# Patient Record
Sex: Male | Born: 1965 | Race: White | Hispanic: No | Marital: Married | Smoking: Never smoker
Health system: Southern US, Community
[De-identification: ages and names within clinical notes are randomized; demographics above are authoritative.]

## PROBLEM LIST (undated history)

## (undated) DIAGNOSIS — I639 Cerebral infarction, unspecified: Secondary | ICD-10-CM

## (undated) DIAGNOSIS — E7800 Pure hypercholesterolemia, unspecified: Secondary | ICD-10-CM

## (undated) DIAGNOSIS — I63511 Cerebral infarction due to unspecified occlusion or stenosis of right middle cerebral artery: Secondary | ICD-10-CM

## (undated) DIAGNOSIS — W132XXA Fall from, out of or through roof, initial encounter: Secondary | ICD-10-CM

## (undated) DIAGNOSIS — D6861 Antiphospholipid syndrome: Secondary | ICD-10-CM

## (undated) DIAGNOSIS — I10 Essential (primary) hypertension: Secondary | ICD-10-CM

## (undated) DIAGNOSIS — Z9109 Other allergy status, other than to drugs and biological substances: Secondary | ICD-10-CM

## (undated) HISTORY — DX: Cerebral infarction due to unspecified occlusion or stenosis of right middle cerebral artery: I63.511

## (undated) HISTORY — PX: NO PAST SURGERIES: SHX2092

## (undated) HISTORY — DX: Pure hypercholesterolemia, unspecified: E78.00

## (undated) HISTORY — DX: Antiphospholipid syndrome: D68.61

## (undated) HISTORY — DX: Cerebral infarction, unspecified: I63.9

---

## 2014-10-03 ENCOUNTER — Ambulatory Visit (INDEPENDENT_AMBULATORY_CARE_PROVIDER_SITE_OTHER): Payer: Self-pay | Admitting: Physician Assistant

## 2014-10-03 VITALS — BP 126/90 | HR 82 | Temp 98.3°F | Resp 18 | Ht 70.0 in | Wt 268.4 lb

## 2014-10-03 DIAGNOSIS — Z0289 Encounter for other administrative examinations: Secondary | ICD-10-CM

## 2014-10-03 DIAGNOSIS — Z021 Encounter for pre-employment examination: Secondary | ICD-10-CM

## 2014-10-03 NOTE — Progress Notes (Deleted)
[facility] [address], [place] [address] [phone]  Date:  10/03/2014   Name:  [name]   DOB:  04-14-1965   MRN:  [id number]  PCP:  No primary care provider on file.    Chief Complaint: Annual Exam   History of Present Illness:  [name] is a 49 y.o. very pleasant male patient who presents with the following:   There are no active problems to display for this patient.   No past medical history on file.  No past surgical history on file.  Social History  Substance Use Topics  . Smoking status: Never Smoker   . Smokeless tobacco: None  . Alcohol Use: No    No family history on file.  Allergies  Allergen Reactions  . Penicillins     headache    Medication list has been reviewed and updated.  No current outpatient prescriptions on file prior to visit.   No current facility-administered medications on file prior to visit.    Review of Systems:  ***  Physical Examination: Filed Vitals:   10/03/14 1853  BP: 130/90  Pulse: 82  Temp: 98.3 F (36.8 C)  Resp: 18   Filed Vitals:   10/03/14 1853  Height:  (1.778 m)  Weight: 268 lb 6 oz (121.734 kg)   Body mass index is 38.51 kg/(m^2). Ideal Body Weight: Weight in (lb) to have BMI = 25: 173.9  ***  Assessment and Plan: ***  Signed Abbe Amsterdam, MD

## 2014-10-03 NOTE — Progress Notes (Deleted)
Commercial Driver Medical Examination   [name] is a 49 y.o. male who presents today for a commercial driver fitness determination physical exam. The patient reports {problems:16946::"no problems"}. {Common ambulatory SmartLinks:19316} Review of Systems {ros - complete:30496}   Objective:    Vision/hearing:  Visual Acuity Screening   Right eye Left eye Both eyes  Without correction:     With correction:    Applicant {can/cannot:17900::"can"} recognize and distinguish among traffic control signals and devices showing standard red, green, and amber colors.  Corrective lenses required: {Yes/No:18319::"No"}  Monocular Vision?: {Yes/No:18319::"No"}  Hearing aid requirement: {Yes/No:18319::"No"}  Physical Exam  BP 130/90 mmHg  Pulse 82  Temp(Src) 98.3 F (36.8 C) (Oral)  Resp 18  Ht  (1.778 m)  Wt 268 lb 6 oz (121.734 kg)  BMI 38.51 kg/m2  SpO2 98%  Labs:    Assessment:    Healthy male exam.  {Determination:19453}    Plan:    {Plan:19460::"Medical examiners certificate completed and printed.","Return as needed."}

## 2014-10-04 NOTE — Progress Notes (Signed)
Commercial Driver Medical Examination   [name] is a 49 y.o. male who presents today for a commercial driver fitness determination physical exam. The patient reports no problems. The following portions of the patient's history were reviewed and updated as appropriate: allergies, current medications, past family history, past medical history, past social history, past surgical history and problem list.  No hx OSA, HTN, DM, kidney or liver disease, lung dz or problems breathing. He has strained his back before but no current problems. He has ophthalmology appt in next couple weeks. He does not have a current PCP, prior PCP retired.  States he does not drive very often. 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  Review of Systems Pertinent items are noted in HPI.   Objective:    Vision/hearing:  Visual Acuity Screening   Right eye Left eye Both eyes  Without correction:     With correction: 20/40 20/40 20/30   whisper at 10 FT bilaterally  Applicant can recognize and distinguish among traffic control signals and devices showing standard red, green, and amber colors.  Corrective lenses required: Yes  Monocular Vision?: No  Hearing aid requirement: No  Physical Exam  Constitutional: He is oriented to person, place, and time. He appears well-developed and well-nourished.  HENT:  Head: Normocephalic and atraumatic.  Right Ear: Hearing, tympanic membrane, external ear and ear canal normal.  Left Ear: Hearing, tympanic membrane, external ear and ear canal normal.  Nose: Nose normal.  Mouth/Throat: Uvula is midline, oropharynx is clear and moist and mucous membranes are normal.  Eyes: Conjunctivae, EOM and lids are normal. Pupils are equal, round, and reactive to light. Right eye exhibits no discharge. Left eye exhibits no discharge. No scleral icterus.  Neck: Trachea normal and normal range of motion. Neck supple. Carotid bruit is not present. No thyromegaly present.   Cardiovascular: Normal rate, regular rhythm, normal heart sounds, intact distal pulses and normal pulses.   No murmur heard. Pulmonary/Chest: Effort normal and breath sounds normal. No respiratory distress. He has no wheezes. He has no rhonchi. He has no rales.  Abdominal: Soft. Normal appearance and bowel sounds are normal. He exhibits no abdominal bruit. There is no tenderness.  Musculoskeletal: Normal range of motion. He exhibits no edema or tenderness.  Lymphadenopathy:       Head (right side): No submental, no submandibular, no tonsillar, no preauricular, no posterior auricular and no occipital adenopathy present.       Head (left side): No submental, no submandibular, no tonsillar, no preauricular, no posterior auricular and no occipital adenopathy present.    He has no cervical adenopathy.  Neurological: He is alert and oriented to person, place, and time. He has normal strength and normal reflexes. No cranial nerve deficit or sensory deficit. Coordination and gait normal.  Skin: Skin is warm, dry and intact. No lesion and no rash noted.  Psychiatric: He has a normal mood and affect. His speech is normal and behavior is normal. Judgment and thought content normal.   BP 126/90 mmHg  Pulse 82  Temp(Src) 98.3 F (36.8 C) (Oral)  Resp 18  Ht 5\' 10"  (1.778 m)  Wt 268 lb 6 oz (121.734 kg)  BMI 38.51 kg/m2  SpO2 98%  Labs:  SP GR 1.015, PRO 30 GLU neg BLOOD neg  Assessment:    Healthy male exam.  Meets standards in 32 CFR 391.41;  qualifies for 2 year certificate.    Plan:    Medical examiners certificate  completed and printed. Return as needed.  Establish care with new PCP.

## 2016-02-11 DIAGNOSIS — I10 Essential (primary) hypertension: Secondary | ICD-10-CM

## 2016-02-11 HISTORY — DX: Essential (primary) hypertension: I10

## 2016-10-18 ENCOUNTER — Encounter: Payer: Self-pay | Admitting: Physician Assistant

## 2016-10-18 ENCOUNTER — Ambulatory Visit (INDEPENDENT_AMBULATORY_CARE_PROVIDER_SITE_OTHER): Payer: Self-pay | Admitting: Physician Assistant

## 2016-10-18 VITALS — BP 171/124 | HR 75 | Temp 98.1°F | Resp 16 | Ht 70.0 in | Wt 285.8 lb

## 2016-10-18 DIAGNOSIS — Z024 Encounter for examination for driving license: Secondary | ICD-10-CM

## 2016-10-18 DIAGNOSIS — R030 Elevated blood-pressure reading, without diagnosis of hypertension: Secondary | ICD-10-CM

## 2016-10-18 DIAGNOSIS — I10 Essential (primary) hypertension: Secondary | ICD-10-CM | POA: Insufficient documentation

## 2016-10-18 NOTE — Progress Notes (Signed)
This patient presents for DOT examination for fitness for duty.   Medical History:  1. Head/Brain Injuries, disorders or illnesses no  2. Seizures, epilepsy no  3. Eye disorders or impaired vision (except corrective lenses) no  4. Ear disorders, loss of hearing or balance no  5. Heart disease or heart attack, other cardiovascular condition no  6. Heart surgery (valve replacement/bypass, angioplasty, pacemaker/defribrillator) no  7. High blood pressure no  8. High cholesterol no  9. Chronic cough, shortness of breath or other breathing problems no  10. Lung disease (emphysema, asthma or chronic bronchitis) no  11. Kidney disease, dialysis no  12. Digestive problems  no  13. Diabetes or elevated blood sugar no                      If yes to #13, Insulin use no  14. Nervious or psychiatric disorders, e.g., severe depression no  15. Fainting or syncope no  16. Dizziness, headaches, numbness, tingling or memory loss no  17. Unexplained weight loss no  18. Stroke, TIA or paralysis no  19. Missing or impaired hand, arm, foot, leg, finger, toe no  20. Spinal injury or disease no  21. Bone, muscles or nerve problems no  22. Blood clots or bleeding bleeding disorders no  23. Cancer no  24. Chronic infection or other chronic diseases no  25. Sleep disorders, pauses in breathing while asleep, daytime sleepiness, loud snoring no  26. Have you ever had a sleep test? no  27.  Have you ever spent a night in the hospital? yes, renal contusion 2001; resolved  28. Have you ever had a broken bone? no  29. Have you or or do you use tobacco products? no  30. Regular, frequent alcohol use no  31. Illegal substance use within the past 2 years no  32.  Have you ever failed a drug test or been dependent on an illegal substance? no   Current Medications: Prior to Admission medications   Medication Sig Start Date End Date Taking? Authorizing Provider  Pseudoephedrine-Ibuprofen (ADVIL COLD/SINUS  PO) Take by mouth daily as needed.   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  Recent URI-type symptoms. Taking OTC cold and sinus product, contains pseudofed. Last dose yesterday.  TESTING:   Visual Acuity Screening   Right eye Left eye Both eyes  Without correction:     With correction: 20/20 20/20 20/20   Comments: Pt could see colors at 10 Ft  Hearing Screening Comments: Pt could hear colors at 10 ft  Monocular Vision: No.  Hearing Aid used for test: No. Hearing Aid required to to meet standard: No.  BP (!) 164/122   Pulse 75   Temp 98.1 F (36.7 C) (Oral)   Resp 16   Ht 5\' 10"  (1.778 m)   Wt 285 lb 12.8 oz (129.6 kg)   SpO2 96%   BMI 41.01 kg/m  Pulse rate is regular.  Comments: SP GR: 1.010, PRO: NEG, BLO: NEG, Sugar: NEG  PHYSICAL EXAMINATION:  General Appearance Not markedly obese. No tremor, signs of alcoholism, problem drinking or drug abuse.   Skin Warm, dry and intact. Scars (No), Tattoos (No)  Eyes Pupils are equal, round and reactive to light and  accommodation, extraocular movements are intact. No exophthalmos, no nystagmus. Evidence of cataracts, retinopathy, macular degeneration, aphakia, glaucoma may need referral to a specialist.  Ears Normal external ears. External canal without occlusion. No scarring of the TM. No perforation of the TM.  Mouth and Throat Clear and moist. No irremedial deformities likely to interfere with breathing or swallowing.  Heart No murmurs, extra sounds, evidence of cardiomegaly. No pacemaker. No implantable defibrillator.  Lungs and Chest (excluding breasts) Normal chest expansion, respiratory rate, breath sounds. No cyanosis.  Abdomen and Viscera No liver enlargement. No splenic enlargement. No masses, bruits,  hernias or significant abdominal wall weakness.  Genitourinary  No inguinal or femoral hernia.  Spine and other musculoskeletal No tenderness, no limitation of motion, no deformities. No evidence of previous surgery.  Extremities No loss or impairment of leg, foot, toe, arm, hand, finger. No perceptible limp, deformities, atrophy, weakness, paralysis, clubbing, edema, hypotonia. Patient has sufficient grasp and prehension to maintain steering wheel grip. Patient has sufficient mobility and strength in the lower limbs to operate pedals properly.  Neurologic Normal equilibrium, coordination, speech pattern. No paresthesia, asymmetry of deep tendon reflexes, sensory or positional abnormalities. No abnormality of patellar or Babinski's reflexes.  Gait Not antalgic or ataxic  Vascular Normal pulses. No carotid or arterial bruits. No varicose veins.    Does not meet standards. Temporarily disqualified due to : elevated blood pressure.  Return to medical examiner's office for follow-up in 1-3 weeks (must be 45d or less)  Wearing corrective lenses: yes Wearing hearing aid: no Accompanied by a N/A waiver/exemption Skill performance Evaluation (SPE) Certificate: no Driving within an exempt intracity zone: no Qualified by operation of 49 CFR 391.64: no  Certification expires N/A

## 2016-10-18 NOTE — Patient Instructions (Addendum)
The level of blood pressure elevation today disqualifies you from medical certification. I suspect that the elevation is, at least in part, due to the recent use of pseudofed-containing medication. Please STOP use of such products. Consider a non-sedating oral antihistamine instead for any allergy or cold symptoms.  Please make arrangements to see ME again in 1-2 weeks to reassess your blood pressure. If it is below 140/90 at that time, I can addend the examination certificate and qualify you. If it is not, we will start you on a blood pressure medication.    IF you received an x-ray today, you will receive an invoice from [facility]. Please contact [facility] at [phone] with questions or concerns regarding your invoice.   IF you received labwork today, you will receive an invoice from [facility]. Please contact [facility] at [phone] with questions or concerns regarding your invoice.   Our billing staff will not be able to assist you with questions regarding bills from these companies.  You will be contacted with the lab results as soon as they are available. The fastest way to get your results is to activate your My Chart account. Instructions are located on the last page of this paperwork. If you have not heard from us regarding the results in 2 weeks, please contact this office.

## 2016-10-18 NOTE — Assessment & Plan Note (Signed)
STOP decongestant. Recheck BP in 1-3 weeks. If >140/90, will initiate evaluation and treatment.

## 2016-11-07 ENCOUNTER — Encounter: Payer: Self-pay | Admitting: Physician Assistant

## 2016-11-07 ENCOUNTER — Ambulatory Visit (INDEPENDENT_AMBULATORY_CARE_PROVIDER_SITE_OTHER): Payer: Self-pay | Admitting: Physician Assistant

## 2016-11-07 VITALS — BP 165/122 | HR 92 | Temp 98.9°F | Resp 18 | Ht 70.5 in | Wt 282.2 lb

## 2016-11-07 DIAGNOSIS — Z23 Encounter for immunization: Secondary | ICD-10-CM

## 2016-11-07 DIAGNOSIS — I10 Essential (primary) hypertension: Secondary | ICD-10-CM

## 2016-11-07 DIAGNOSIS — Z6839 Body mass index (BMI) 39.0-39.9, adult: Secondary | ICD-10-CM

## 2016-11-07 DIAGNOSIS — J309 Allergic rhinitis, unspecified: Secondary | ICD-10-CM

## 2016-11-07 MED ORDER — AMLODIPINE BESYLATE 10 MG PO TABS
10.0000 mg | ORAL_TABLET | Freq: Every day | ORAL | 3 refills | Status: DC
Start: 2016-11-07 — End: 2017-02-26

## 2016-11-07 MED ORDER — FLUTICASONE PROPIONATE 50 MCG/ACT NA SUSP: 2.0000 | Freq: Every day | NASAL | 3 refills | Status: DC

## 2016-11-07 NOTE — Patient Instructions (Addendum)
Please fast for your wellness visit. Hopefully, we will have the records from your previous provider by then and can identify what care gaps there are that we need to address.    IF you received an x-ray today, you will receive an invoice from [facility]. Please contact [facility] at [phone] with questions or concerns regarding your invoice.   IF you received labwork today, you will receive an invoice from [facility]. Please contact [facility] at [phone] with questions or concerns regarding your invoice.   Our billing staff will not be able to assist you with questions regarding bills from these companies.  You will be contacted with the lab results as soon as they are available. The fastest way to get your results is to activate your My Chart account. Instructions are located on the last page of this paperwork. If you have not heard from [place] regarding the results in 2 weeks, please contact this office.

## 2016-11-07 NOTE — Assessment & Plan Note (Signed)
Stop oral decongestants entirely, due to HTN. Use steroid NS and OTC non-sedating oral antihistamine. Would add azelastine and/or montelukast if needs additional treatment.

## 2016-11-07 NOTE — Progress Notes (Signed)
Patient ID: [name], male    DOB: January 13, 1966, 51 y.o.   MRN: [id number]  PCP: Patient, No Pcp Per  Chief Complaint  Patient presents with  . Hypertension  . Follow-up  . Employment Physical    DOT     Subjective:   Presents for evaluation of elevated blood pressure.  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 He had been using OTC decongestants regularly, and it was thought that was likely contributing to his elevated BP. I advised he stop the decongestants and come back for re-evaluation, potentially to qualify for a new card.  Has continued to take pseudoephedrine-containing products intermittently since he was here 10/18/2016. His allergy symptoms have waxed and waned with our recent rainy weather.  No CP, SOB, dizziness. HA associated with congestion and drainage, resolves with oral decongestant.  Review of Systems As above.    Patient Active Problem List   Diagnosis Date Noted  . Elevated blood-pressure reading without diagnosis of hypertension 10/18/2016     Prior to Admission medications   Medication Sig Start Date End Date Taking? Authorizing Provider  Pseudoephedrine-Ibuprofen (ADVIL COLD/SINUS PO) Take by mouth daily as needed.   Yes [provider]     Allergies  Allergen Reactions  . Penicillins     headache       Objective:  Physical Exam  Constitutional: He is oriented to person, place, and time. He appears well-developed and well-nourished. He is active and cooperative. No distress.  BP (!) 162/90   Pulse 92   Temp 98.9 F (37.2 C) (Oral)   Resp 18   Ht 5' 10.5" (1.791 m)   Wt 282 lb 3.2 oz (128 kg)   SpO2 97%   BMI 39.92 kg/m   HENT:  Head: Normocephalic and atraumatic.  Right Ear: Hearing normal.  Left Ear: Hearing normal.  Eyes: Conjunctivae are normal. No scleral icterus.  Neck: Normal range of motion. Neck supple. No thyromegaly present.  Cardiovascular: Normal rate,  regular rhythm and normal heart sounds.   Pulses:      Radial pulses are 2+ on the right side, and 2+ on the left side.  Pulmonary/Chest: Effort normal and breath sounds normal.  Lymphadenopathy:       Head (right side): No tonsillar, no preauricular, no posterior auricular and no occipital adenopathy present.       Head (left side): No tonsillar, no preauricular, no posterior auricular and no occipital adenopathy present.    He has no cervical adenopathy.       Right: No supraclavicular adenopathy present.       Left: No supraclavicular adenopathy present.  Neurological: He is alert and oriented to person, place, and time. No sensory deficit.  Skin: Skin is warm, dry and intact. No rash noted. No cyanosis or erythema. Nails show no clubbing.  Psychiatric: He has a normal mood and affect. His speech is normal and behavior is normal.       Assessment & Plan:   Problem List Items Addressed This Visit    Benign essential HTN - Primary    Again advised against use of oral decongestant. Treat allergies with other products. Initiate treatment of HTN with amlodipine. Will check labs and EKG at his upcoming wellness visit.      Relevant Medications   amLODipine (NORVASC) 10 MG tablet   Allergic rhinitis    Stop oral decongestants entirely, due to HTN. Use steroid  NS and OTC non-sedating oral antihistamine. Would add azelastine and/or montelukast if needs additional treatment.      Relevant Medications   fluticasone (FLONASE) 50 MCG/ACT nasal spray   BMI 39.0-39.9,adult    Encouraged healthy eating and regular exercise.       Other Visit Diagnoses    Need for influenza vaccination       Relevant Orders   Flu Vaccine QUAD 36+ mos IM (Completed)   Care order/instruction:       Return for Wellness Exam and fasting labs in the next 1-8 weeks.   Fernande Bras, PA-C [facility]

## 2016-11-07 NOTE — Assessment & Plan Note (Signed)
Encouraged healthy eating and regular exercise. 

## 2016-11-07 NOTE — Assessment & Plan Note (Signed)
Again advised against use of oral decongestant. Treat allergies with other products. Initiate treatment of HTN with amlodipine. Will check labs and EKG at his upcoming wellness visit.

## 2016-11-15 ENCOUNTER — Encounter: Payer: Self-pay | Admitting: Physician Assistant

## 2016-11-19 ENCOUNTER — Encounter: Payer: Self-pay | Admitting: Physician Assistant

## 2016-11-26 ENCOUNTER — Encounter: Payer: Self-pay | Admitting: Physician Assistant

## 2016-11-26 ENCOUNTER — Ambulatory Visit (INDEPENDENT_AMBULATORY_CARE_PROVIDER_SITE_OTHER): Payer: BLUE CROSS/BLUE SHIELD | Admitting: Physician Assistant

## 2016-11-26 VITALS — BP 136/96 | HR 98 | Temp 98.6°F | Resp 18 | Ht 70.5 in | Wt 285.8 lb

## 2016-11-26 DIAGNOSIS — Z13220 Encounter for screening for lipoid disorders: Secondary | ICD-10-CM

## 2016-11-26 DIAGNOSIS — Z125 Encounter for screening for malignant neoplasm of prostate: Secondary | ICD-10-CM | POA: Diagnosis not present

## 2016-11-26 DIAGNOSIS — I10 Essential (primary) hypertension: Secondary | ICD-10-CM | POA: Diagnosis not present

## 2016-11-26 DIAGNOSIS — Z114 Encounter for screening for human immunodeficiency virus [HIV]: Secondary | ICD-10-CM | POA: Diagnosis not present

## 2016-11-26 DIAGNOSIS — Z0000 Encounter for general adult medical examination without abnormal findings: Secondary | ICD-10-CM

## 2016-11-26 DIAGNOSIS — Z1211 Encounter for screening for malignant neoplasm of colon: Secondary | ICD-10-CM | POA: Diagnosis not present

## 2016-11-26 DIAGNOSIS — Z23 Encounter for immunization: Secondary | ICD-10-CM | POA: Diagnosis not present

## 2016-11-26 NOTE — Assessment & Plan Note (Signed)
Improved with increased dose of amlodipine, but still borderline. Advised he check at home 1-2 times/week. 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

## 2016-11-26 NOTE — Patient Instructions (Addendum)
Keep up the great work. Weight loss will help your blood pressure. Check your blood pressure at home 1-2 times each week and record the results. Our goal is less than 120/80. If it's consistently above 140/90, we'll need to adjust the medication.    IF you received an x-ray today, you will receive an invoice from [facility]. Please contact [facility] at [phone] with questions or concerns regarding your invoice.   IF you received labwork today, you will receive an invoice from [facility]. Please contact [facility] at [phone] with questions or concerns regarding your invoice.   Our billing staff will not be able to assist you with questions regarding bills from these companies.  You will be contacted with the lab results as soon as they are available. The fastest way to get your results is to activate your My Chart account. Instructions are located on the last page of this paperwork. If you have not heard from [place] regarding the results in 2 weeks, please contact this office.     Preventive Care 40-64 Years, Male Preventive care refers to lifestyle choices and visits with your health care provider that can promote health and wellness. What does preventive care include?  A yearly physical exam. This is also called an annual well check.  Dental exams once or twice a year.  Routine eye exams. Ask your health care provider how often you should have your eyes checked.  Personal lifestyle choices, including: ? Daily care of your teeth and gums. ? Regular physical activity. ? Eating a healthy diet. ? Avoiding tobacco and drug use. ? Limiting alcohol use. ? Practicing safe sex. ? Taking low-dose aspirin every day starting at age 63. What happens during an annual well check? The services and screenings done by your health care provider during your annual well check will depend on your age, overall health, lifestyle risk factors, and family history of  disease. Counseling Your health care provider may ask you questions about your:  Alcohol use.  Tobacco use.  Drug use.  Emotional well-being.  Home and relationship well-being.  Sexual activity.  Eating habits.  Work and work Astronomer.  Screening You may have the following tests or measurements:  Height, weight, and BMI.  Blood pressure.  Lipid and cholesterol levels. These may be checked every 5 years, or more frequently if you are over 50 years old.  Skin check.  Lung cancer screening. You may have this screening every year starting at age 81 if you have a 30-pack-year history of smoking and currently smoke or have quit within the past 15 years.  Fecal occult blood test (FOBT) of the stool. You may have this test every year starting at age 9.  Flexible sigmoidoscopy or colonoscopy. You may have a sigmoidoscopy every 5 years or a colonoscopy every 10 years starting at age 93.  Prostate cancer screening. Recommendations will vary depending on your family history and other risks.  Hepatitis C blood test.  Hepatitis B blood test.  Sexually transmitted disease (STD) testing.  Diabetes screening. This is done by checking your blood sugar (glucose) after you have not eaten for a while (fasting). You may have this done every 1-3 years.  Discuss your test results, treatment options, and if necessary, the need for more tests with your health care provider. Vaccines Your health care provider may recommend certain vaccines, such as:  Influenza vaccine. This is recommended every year.  Tetanus, diphtheria, and acellular pertussis (Tdap, Td) vaccine. You may need a  Td booster every 10 years.  Varicella vaccine. You may need this if you have not been vaccinated.  Zoster vaccine. You may need this after age 48.  Measles, mumps, and rubella (MMR) vaccine. You may need at least one dose of MMR if you were born in 1957 or later. You may also need a second  dose.  Pneumococcal 13-valent conjugate (PCV13) vaccine. You may need this if you have certain conditions and have not been vaccinated.  Pneumococcal polysaccharide (PPSV23) vaccine. You may need one or two doses if you smoke cigarettes or if you have certain conditions.  Meningococcal vaccine. You may need this if you have certain conditions.  Hepatitis A vaccine. You may need this if you have certain conditions or if you travel or work in places where you may be exposed to hepatitis A.  Hepatitis B vaccine. You may need this if you have certain conditions or if you travel or work in places where you may be exposed to hepatitis B.  Haemophilus influenzae type b (Hib) vaccine. You may need this if you have certain risk factors.  Talk to your health care provider about which screenings and vaccines you need and how often you need them. This information is not intended to replace advice given to you by your health care provider. Make sure you discuss any questions you have with your health care provider. Document Released: 02/23/2015 Document Revised: 10/17/2015 Document Reviewed: 11/28/2014 Elsevier Interactive Patient Education  2017 [employer].

## 2016-11-26 NOTE — Progress Notes (Signed)
Subjective:    Patient ID: [name], male    DOB: March 12, 1965, 51 y.o.   MRN: [id number]  HPI  [name] is a 51 year old Caucasian male with a history of hypertension and allergic rhinitis, and a family history of hypertension who presents today for his annual physical exam. He reports overall everything is going well. He is compliant with his Amlodipine. [name] reports he is not checking his blood pressures at home, but he does have a cuff at home and would consider starting. [name] denies headaches, chest pain and dyspnea. He also denies swelling in his hands and feet, orthopnea, and PND.   [name] reports he usually eats 3 meals per day. On the weekends he will eat a bigger lunch and snacks the rest of the day. He states he is starting to eat more well-balanced meals. [name] is walking more and is about to start working biking on his stationary bike. [name] reports he is drinking alcohol 2 times per year. He denies tobacco and illicit drug use. [name] is currently   Working from home and traveling more frequently. Not minding traveling as much.   [name] reports his allergic rhinitis is currently well controlled. The pollen bothers him all year round, but the spring and start of fall are the worst. He reports he takes Careers adviserAllegra daily and using Flonase as needed.   Of note, [name] wears glasses and his last optometry appointment was in July 2018.  [name]'s mother, father, and sister all have hypertension. He had two aunts who died of heart attacks and his uncle had leukemia.   Medications:  - Amlodipine 10mg  daily - Flonase 50mg c/act 2 sprays as needed  Allergies:  - Penicillins (reaction: headaches and queasy) - Blueberries and strawberries (Reaction: headaches)    Chronic Medical Conditions:  - Hypertension - Allergic rhinitis   Surgeries:  - None  Immunizations:  - Up to date  Review of Systems    Objective:   Physical Exam Anthropometrics:  Height:  5' 10.5" Weight: 285 lbs 12.8oz BMI: 40.43 kg/m2 WC: 53.5 in   Vitals:  Temp: 98.6 F, orally BP: 140/100 HR: 98 bpm RR: 18 breaths per minute O2 Sat: 97%  General: 51 year old overweight Caucasian male sitting comfortably on exam table in no acute distress  Head: Normocephalic, atraumatic  Eyes: Conjunctiva pink, sclera clear. Pupils equal and reactive to light and accomodation. Extraoccular movements intact.  Ears: External auditory canal without erythema or cerumen bilaterally. Bilateral TMs pearly grey without erythema or effusions bilaterally. All bony landmarks visible bilaterally.  Nose: Nares patent bilaterally. No rhinorrhea. Nasal turbinates not boggy. Mouth: Lips and oral mucosa without lesions. Good dentition. Posterior oropharynx without erythema.   Neck: Supple. No cervical lymphadenopathy. No thyromegaly or thyroid tenderness. Cardiovascular: Regular rate and rhythm. S1 and S2 distinct. No murmurs auscultated. 2+ radial, posterior tibialis, and dorsalis pedis pulses.  Lungs: Clear to auscultation bilaterally.  Abdomen: Protuberant. Bowel sounds active in all 4 quadrants. Non-tender to palpation in all 4 quadrants:  MSK: 5/5 strength in all extremities. Neuro: Cranial nerves intact. Good sensation on all extremities.     Assessment & Plan:  1. Annual physical exam - CMP obtained today - CBC obtained today - Lipid Panel obtained today - TSH obtained today in clinic - HIV screen obtained today in clinic - Referred to gastroenterology for Colonoscopy   2. Hypertension  - Continue Amlodipine 10mg  daily -  Recommended patient measures at home blood pressures and log values for [place] to continue to monitor blood pressure.  - Encouraged patient to continue to eat more fruits and vegetables, lean meats, and decrease sodium intake. Also encouraged patient to continue to get at least 30 minutes of exercise 5 times per week.  - Follow-up in 3 months for blood pressure  re-check  3. Allergic Rhinitis  - Continue Allegra daily - Continue Flonase 51mcg/act 2 sprays in both nostrils per day as needed for nasal congestion   Neilani Duffee, PA-S

## 2016-11-26 NOTE — Progress Notes (Signed)
Patient ID: [name], male    DOB: 11/29/1965, 51 y.o.   MRN: [id number]  PCP: Porfirio Oar, PA-C  Chief Complaint  Patient presents with  . Annual Exam  . Paperwork    Physician results form    Subjective:   Presents for [employer] Visit.  He is tolerating amlodipine to treat HTN, recently increased from 5 mg to 10 mg to achieve improved control. 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 He does not check his BP at home, but his wife has a monitor and he could use it.  He is working on [employer] and exercise. He recently ordered a stationary bike.  Colorectal Cancer Screening: not yet. Will refer today. Prostate Cancer Screening: no previous PSA noted. Bone Density Testing: not yet a candidate HIV Screening: today STI Screening: very low risk Seasonal Influenza Vaccination: current Td/Tdap Vaccination: today Pneumococcal Vaccination: not yet a candidate Zoster Vaccination: not yet Frequency of Eye evaluation: 08/2016, annually     Patient Active Problem List   Diagnosis Date Noted  . Allergic rhinitis 11/07/2016  . BMI 39.0-39.9,adult 11/07/2016  . Benign essential HTN 10/18/2016    History reviewed. No pertinent past medical history.   Prior to Admission medications   Medication Sig Start Date End Date Taking? Authorizing Provider  amLODipine (NORVASC) 10 MG tablet Take 1 tablet (10 mg total) by mouth daily. 11/07/16  Yes Kathelene Rumberger, PA-C  fluticasone (FLONASE) 50 MCG/ACT nasal spray Place 2 sprays into both nostrils daily. 11/07/16  Yes Porfirio Oar, PA-C    Allergies  Allergen Reactions  . Penicillins     headache    Past Surgical History:  Procedure Laterality Date  . NO PAST SURGERIES      Family History  Problem Relation Age of Onset  . Hypertension Mother   . Hypertension Father   . Stroke Father   . Hypertension Sister     Social History   Social History  . Marital status: Married      Spouse name: Vickie  . Number of children: 0  . Years of education: College   Occupational History  . outside sales     [employer]   Social History Main Topics  . Smoking status: Never Smoker  . Smokeless tobacco: Never Used  . Alcohol use 0.0 oz/week     Comment: rarely, 1-2 times/year  . Drug use: No  . Sexual activity: Yes    Partners: Female   Other Topics Concern  . None   Social History Narrative   Lives with his wife.       Review of Systems  Constitutional: Negative.   HENT: Negative.   Eyes: Negative.   Respiratory: Negative.   Cardiovascular: Negative.   Gastrointestinal: Negative.   Endocrine: Negative.   Genitourinary: Negative.   Musculoskeletal: Negative.   Skin: Negative.   Allergic/Immunologic: Positive for environmental allergies (well controlled).  Neurological: Negative.   Hematological: Negative.   Psychiatric/Behavioral: Negative.         Objective:  Physical Exam  Constitutional: He is oriented to person, place, and time. He appears well-developed and well-nourished. He is active and cooperative.  Non-toxic appearance. He does not have a sickly appearance. He does not appear ill. No distress.  BP (!) 136/96 (BP Location: Left Arm, Patient Position: Sitting, Cuff Size: Large)   Pulse 98   Temp 98.6 F (37 C) (Oral)   Resp 18  Ht 5' 10.5" (1.791 m)   Wt 285 lb 12.8 oz (129.6 kg)   SpO2 97%   BMI 40.43 kg/m    HENT:  Head: Normocephalic and atraumatic.  Right Ear: Hearing, tympanic membrane, external ear and ear canal normal.  Left Ear: Hearing, tympanic membrane, external ear and ear canal normal.  Nose: Nose normal.  Mouth/Throat: Uvula is midline, oropharynx is clear and moist and mucous membranes are normal. He does not have dentures. No oral lesions. No trismus in the jaw. Normal dentition. No dental abscesses, uvula swelling, lacerations or dental caries.  Eyes: Pupils are equal, round, and reactive to light.  Conjunctivae, EOM and lids are normal. Right eye exhibits no discharge. Left eye exhibits no discharge. No scleral icterus.  Fundoscopic exam:      The right eye shows no arteriolar narrowing, no AV nicking, no exudate, no hemorrhage and no papilledema.       The left eye shows no arteriolar narrowing, no AV nicking, no exudate, no hemorrhage and no papilledema.  Neck: Normal range of motion, full passive range of motion without pain and phonation normal. Neck supple. No spinous process tenderness and no muscular tenderness present. No neck rigidity. No tracheal deviation, no edema, no erythema and normal range of motion present. No thyromegaly present.  Cardiovascular: Normal rate, regular rhythm, S1 normal, S2 normal, normal heart sounds, intact distal pulses and normal pulses.  Exam reveals no gallop and no friction rub.   No murmur heard. Pulmonary/Chest: Effort normal and breath sounds normal. No respiratory distress. He has no wheezes. He has no rales.  Abdominal: Soft. Normal appearance and bowel sounds are normal. He exhibits no distension and no mass. There is no hepatosplenomegaly. There is no tenderness. There is no rebound and no guarding. No hernia.  Musculoskeletal: Normal range of motion. He exhibits no edema or tenderness.       Cervical back: Normal. He exhibits normal range of motion, no tenderness, no bony tenderness, no swelling, no edema, no deformity, no laceration, no pain, no spasm and normal pulse.       Thoracic back: Normal.       Lumbar back: Normal.  Lymphadenopathy:       Head (right side): No submental, no submandibular, no tonsillar, no preauricular, no posterior auricular and no occipital adenopathy present.       Head (left side): No submental, no submandibular, no tonsillar, no preauricular, no posterior auricular and no occipital adenopathy present.    He has no cervical adenopathy.       Right: No supraclavicular adenopathy present.       Left: No  supraclavicular adenopathy present.  Neurological: He is alert and oriented to person, place, and time. He has normal strength and normal reflexes. He displays no tremor. No cranial nerve deficit. He exhibits normal muscle tone. Coordination and gait normal.  Skin: Skin is warm, dry and intact. No abrasion, no ecchymosis, no laceration, no lesion and no rash noted. He is not diaphoretic. No cyanosis or erythema. No pallor. Nails show no clubbing.  Psychiatric: He has a normal mood and affect. His speech is normal and behavior is normal. Judgment and thought content normal. Cognition and memory are normal.    Wt Readings from Last 3 Encounters:  11/26/16 285 lb 12.8 oz (129.6 kg)  11/07/16 282 lb 3.2 oz (128 kg)  10/18/16 285 lb 12.8 oz (129.6 kg)       Assessment & Plan:   Problem List Items  Addressed This Visit    Benign essential HTN    Improved with increased dose of amlodipine, but still borderline. Advised he check at home 1-2 times/week. 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      Relevant Orders   CBC with Differential/Platelet   Comprehensive metabolic panel   TSH   Urinalysis, dipstick only    Other Visit Diagnoses    Annual physical exam    -  Primary   Age appropriate health guidance provided.   Screening for HIV (human immunodeficiency virus)       Relevant Orders   HIV antibody   Screening for hyperlipidemia       Relevant Orders   Lipid panel   Screening for prostate cancer       Relevant Orders   PSA   Screening for colon cancer       Relevant Orders   Ambulatory referral to Gastroenterology   Need for Tdap vaccination       Relevant Orders   Tdap vaccine greater than or equal to 7yo IM (Completed)       Return in about 3 months (around 02/26/2017) for re-evalaution of blood pressure.   Fernande Brashelle S. Annel Zunker, PA-C [facility] at [facility]

## 2016-11-27 LAB — URINALYSIS, DIPSTICK ONLY
Bilirubin, UA: NEGATIVE
Glucose, UA: NEGATIVE
Ketones, UA: NEGATIVE
Leukocytes, UA: NEGATIVE
Nitrite, UA: NEGATIVE
Protein, UA: NEGATIVE
RBC, UA: NEGATIVE
Specific Gravity, UA: 1.018 (ref 1.005–1.030)
Urobilinogen, Ur: 0.2 mg/dL (ref 0.2–1.0)
pH, UA: 6 (ref 5.0–7.5)

## 2016-11-27 LAB — CBC WITH DIFFERENTIAL/PLATELET
Basophils Absolute: 0 10*3/uL (ref 0.0–0.2)
Basos: 0 %
EOS (ABSOLUTE): 0.2 10*3/uL (ref 0.0–0.4)
Eos: 2 %
Hematocrit: 51.6 % — ABNORMAL HIGH (ref 37.5–51.0)
Hemoglobin: 17.3 g/dL (ref 13.0–17.7)
Immature Grans (Abs): 0.1 10*3/uL (ref 0.0–0.1)
Immature Granulocytes: 1 %
Lymphocytes Absolute: 2.7 10*3/uL (ref 0.7–3.1)
Lymphs: 36 %
MCH: 30.8 pg (ref 26.6–33.0)
MCHC: 33.5 g/dL (ref 31.5–35.7)
MCV: 92 fL (ref 79–97)
Monocytes Absolute: 0.6 10*3/uL (ref 0.1–0.9)
Monocytes: 8 %
Neutrophils Absolute: 3.9 10*3/uL (ref 1.4–7.0)
Neutrophils: 53 %
Platelets: 276 10*3/uL (ref 150–379)
RBC: 5.61 x10E6/uL (ref 4.14–5.80)
RDW: 13 % (ref 12.3–15.4)
WBC: 7.4 10*3/uL (ref 3.4–10.8)

## 2016-11-27 LAB — LIPID PANEL
Chol/HDL Ratio: 6 ratio — ABNORMAL HIGH (ref 0.0–5.0)
Cholesterol, Total: 205 mg/dL — ABNORMAL HIGH (ref 100–199)
HDL: 34 mg/dL — ABNORMAL LOW (ref >39)
LDL Calculated: 143 mg/dL — ABNORMAL HIGH (ref 0–99)
Triglycerides: 139 mg/dL (ref 0–149)
VLDL Cholesterol Cal: 28 mg/dL (ref 5–40)

## 2016-11-27 LAB — HIV ANTIBODY (ROUTINE TESTING W REFLEX): HIV Screen 4th Generation wRfx: NONREACTIVE

## 2016-11-27 LAB — COMPREHENSIVE METABOLIC PANEL
ALT: 38 IU/L (ref 0–44)
AST: 32 IU/L (ref 0–40)
Albumin/Globulin Ratio: 1.5 (ref 1.2–2.2)
Albumin: 4.6 g/dL (ref 3.5–5.5)
Alkaline Phosphatase: 88 IU/L (ref 39–117)
BUN/Creatinine Ratio: 16 (ref 9–20)
BUN: 18 mg/dL (ref 6–24)
Bilirubin Total: 1 mg/dL (ref 0.0–1.2)
CO2: 22 mmol/L (ref 20–29)
Calcium: 9.6 mg/dL (ref 8.7–10.2)
Chloride: 101 mmol/L (ref 96–106)
Creatinine, Ser: 1.11 mg/dL (ref 0.76–1.27)
GFR calc Af Amer: 89 mL/min/{1.73_m2} (ref >59)
GFR calc non Af Amer: 77 mL/min/{1.73_m2} (ref >59)
Globulin, Total: 3.1 g/dL (ref 1.5–4.5)
Glucose: 109 mg/dL — ABNORMAL HIGH (ref 65–99)
Potassium: 4.4 mmol/L (ref 3.5–5.2)
Sodium: 140 mmol/L (ref 134–144)
Total Protein: 7.7 g/dL (ref 6.0–8.5)

## 2016-11-27 LAB — TSH: TSH: 1.92 u[IU]/mL (ref 0.450–4.500)

## 2016-11-27 LAB — PSA: Prostate Specific Ag, Serum: 0.5 ng/mL (ref 0.0–4.0)

## 2016-12-03 ENCOUNTER — Encounter: Payer: Self-pay | Admitting: Physician Assistant

## 2016-12-03 NOTE — Progress Notes (Signed)
Letter sent.

## 2016-12-30 ENCOUNTER — Encounter: Payer: Self-pay | Admitting: Physician Assistant

## 2017-01-09 ENCOUNTER — Telehealth: Payer: Self-pay

## 2017-01-21 NOTE — Telephone Encounter (Signed)
Error-Close Encounter 

## 2017-02-12 ENCOUNTER — Inpatient Hospital Stay (HOSPITAL_COMMUNITY)
Admission: EM | Admit: 2017-02-12 | Discharge: 2017-02-17 | DRG: 041 | Disposition: A | Discharge status: Rehabilitation Facility | Payer: BLUE CROSS/BLUE SHIELD | Attending: Neurology | Admitting: Neurology

## 2017-02-12 ENCOUNTER — Encounter (HOSPITAL_COMMUNITY): Payer: Self-pay | Admitting: *Deleted

## 2017-02-12 ENCOUNTER — Emergency Department (HOSPITAL_COMMUNITY): Payer: BLUE CROSS/BLUE SHIELD

## 2017-02-12 ENCOUNTER — Inpatient Hospital Stay (HOSPITAL_COMMUNITY): Payer: BLUE CROSS/BLUE SHIELD

## 2017-02-12 ENCOUNTER — Other Ambulatory Visit: Payer: Self-pay

## 2017-02-12 DIAGNOSIS — I351 Nonrheumatic aortic (valve) insufficiency: Secondary | ICD-10-CM | POA: Diagnosis not present

## 2017-02-12 DIAGNOSIS — I6340 Cerebral infarction due to embolism of unspecified cerebral artery: Secondary | ICD-10-CM | POA: Diagnosis not present

## 2017-02-12 DIAGNOSIS — K219 Gastro-esophageal reflux disease without esophagitis: Secondary | ICD-10-CM | POA: Diagnosis not present

## 2017-02-12 DIAGNOSIS — Z79899 Other long term (current) drug therapy: Secondary | ICD-10-CM

## 2017-02-12 DIAGNOSIS — I63131 Cerebral infarction due to embolism of right carotid artery: Secondary | ICD-10-CM | POA: Diagnosis not present

## 2017-02-12 DIAGNOSIS — E669 Obesity, unspecified: Secondary | ICD-10-CM | POA: Diagnosis present

## 2017-02-12 DIAGNOSIS — Z91048 Other nonmedicinal substance allergy status: Secondary | ICD-10-CM

## 2017-02-12 DIAGNOSIS — R29705 NIHSS score 5: Secondary | ICD-10-CM | POA: Diagnosis present

## 2017-02-12 DIAGNOSIS — I69354 Hemiplegia and hemiparesis following cerebral infarction affecting left non-dominant side: Secondary | ICD-10-CM | POA: Diagnosis not present

## 2017-02-12 DIAGNOSIS — Z8673 Personal history of transient ischemic attack (TIA), and cerebral infarction without residual deficits: Secondary | ICD-10-CM

## 2017-02-12 DIAGNOSIS — Z8249 Family history of ischemic heart disease and other diseases of the circulatory system: Secondary | ICD-10-CM

## 2017-02-12 DIAGNOSIS — R29810 Facial weakness: Secondary | ICD-10-CM | POA: Diagnosis present

## 2017-02-12 DIAGNOSIS — R402362 Coma scale, best motor response, obeys commands, at arrival to emergency department: Secondary | ICD-10-CM | POA: Diagnosis present

## 2017-02-12 DIAGNOSIS — I63511 Cerebral infarction due to unspecified occlusion or stenosis of right middle cerebral artery: Secondary | ICD-10-CM | POA: Diagnosis not present

## 2017-02-12 DIAGNOSIS — Z880 Allergy status to penicillin: Secondary | ICD-10-CM | POA: Diagnosis not present

## 2017-02-12 DIAGNOSIS — R739 Hyperglycemia, unspecified: Secondary | ICD-10-CM | POA: Diagnosis not present

## 2017-02-12 DIAGNOSIS — I10 Essential (primary) hypertension: Secondary | ICD-10-CM | POA: Diagnosis present

## 2017-02-12 DIAGNOSIS — I639 Cerebral infarction, unspecified: Secondary | ICD-10-CM

## 2017-02-12 DIAGNOSIS — Z6841 Body Mass Index (BMI) 40.0 and over, adult: Secondary | ICD-10-CM | POA: Diagnosis not present

## 2017-02-12 DIAGNOSIS — R402252 Coma scale, best verbal response, oriented, at arrival to emergency department: Secondary | ICD-10-CM | POA: Diagnosis present

## 2017-02-12 DIAGNOSIS — E639 Nutritional deficiency, unspecified: Secondary | ICD-10-CM | POA: Diagnosis not present

## 2017-02-12 DIAGNOSIS — R4781 Slurred speech: Secondary | ICD-10-CM | POA: Diagnosis present

## 2017-02-12 DIAGNOSIS — E785 Hyperlipidemia, unspecified: Secondary | ICD-10-CM | POA: Diagnosis present

## 2017-02-12 DIAGNOSIS — G8194 Hemiplegia, unspecified affecting left nondominant side: Secondary | ICD-10-CM | POA: Diagnosis present

## 2017-02-12 DIAGNOSIS — R402142 Coma scale, eyes open, spontaneous, at arrival to emergency department: Secondary | ICD-10-CM | POA: Diagnosis present

## 2017-02-12 DIAGNOSIS — I6330 Cerebral infarction due to thrombosis of unspecified cerebral artery: Secondary | ICD-10-CM | POA: Diagnosis not present

## 2017-02-12 DIAGNOSIS — I6389 Other cerebral infarction: Secondary | ICD-10-CM | POA: Diagnosis not present

## 2017-02-12 HISTORY — DX: Cerebral infarction, unspecified: I63.9

## 2017-02-12 HISTORY — DX: Essential (primary) hypertension: I10

## 2017-02-12 HISTORY — DX: Fall from, out of or through roof, initial encounter: W13.2XXA

## 2017-02-12 HISTORY — DX: Other allergy status, other than to drugs and biological substances: Z91.09

## 2017-02-12 LAB — CBC
HCT: 46.6 % (ref 39.0–52.0)
Hemoglobin: 16.4 g/dL (ref 13.0–17.0)
MCH: 31.5 pg (ref 26.0–34.0)
MCHC: 35.2 g/dL (ref 30.0–36.0)
MCV: 89.6 fL (ref 78.0–100.0)
Platelets: 245 10*3/uL (ref 150–400)
RBC: 5.2 MIL/uL (ref 4.22–5.81)
RDW: 13 % (ref 11.5–15.5)
WBC: 8.2 10*3/uL (ref 4.0–10.5)

## 2017-02-12 LAB — I-STAT TROPONIN, ED: Troponin i, poc: 0.01 ng/mL (ref 0.00–0.08)

## 2017-02-12 LAB — COMPREHENSIVE METABOLIC PANEL
ALT: 34 U/L (ref 17–63)
AST: 27 U/L (ref 15–41)
Albumin: 3.9 g/dL (ref 3.5–5.0)
Alkaline Phosphatase: 85 U/L (ref 38–126)
Anion gap: 9 (ref 5–15)
BUN: 19 mg/dL (ref 6–20)
CO2: 20 mmol/L — ABNORMAL LOW (ref 22–32)
Calcium: 9.4 mg/dL (ref 8.9–10.3)
Chloride: 110 mmol/L (ref 101–111)
Creatinine, Ser: 1.07 mg/dL (ref 0.61–1.24)
GFR calc Af Amer: >60 mL/min (ref >60)
GFR calc non Af Amer: >60 mL/min (ref >60)
Glucose, Bld: 115 mg/dL — ABNORMAL HIGH (ref 65–99)
Potassium: 3.8 mmol/L (ref 3.5–5.1)
Sodium: 139 mmol/L (ref 135–145)
Total Bilirubin: 1.3 mg/dL — ABNORMAL HIGH (ref 0.3–1.2)
Total Protein: 7.2 g/dL (ref 6.5–8.1)

## 2017-02-12 LAB — URINALYSIS, ROUTINE W REFLEX MICROSCOPIC
Bacteria, UA: NONE SEEN
Bilirubin Urine: NEGATIVE
Glucose, UA: NEGATIVE mg/dL
Ketones, ur: NEGATIVE mg/dL
Leukocytes, UA: NEGATIVE
Nitrite: NEGATIVE
Protein, ur: NEGATIVE mg/dL
Specific Gravity, Urine: 1.034 — ABNORMAL HIGH (ref 1.005–1.030)
Squamous Epithelial / LPF: NONE SEEN
pH: 5 (ref 5.0–8.0)

## 2017-02-12 LAB — I-STAT CHEM 8, ED
BUN: 21 mg/dL — ABNORMAL HIGH (ref 6–20)
Calcium, Ion: 1.16 mmol/L (ref 1.15–1.40)
Chloride: 107 mmol/L (ref 101–111)
Creatinine, Ser: 1.1 mg/dL (ref 0.61–1.24)
Glucose, Bld: 117 mg/dL — ABNORMAL HIGH (ref 65–99)
HCT: 47 % (ref 39.0–52.0)
Hemoglobin: 16 g/dL (ref 13.0–17.0)
Potassium: 3.8 mmol/L (ref 3.5–5.1)
Sodium: 141 mmol/L (ref 135–145)
TCO2: 22 mmol/L (ref 22–32)

## 2017-02-12 LAB — RAPID URINE DRUG SCREEN, HOSP PERFORMED
Amphetamines: NOT DETECTED
Barbiturates: NOT DETECTED
Benzodiazepines: NOT DETECTED
Cocaine: NOT DETECTED
Opiates: NOT DETECTED
Tetrahydrocannabinol: NOT DETECTED

## 2017-02-12 LAB — PROTIME-INR
INR: 1.01
Prothrombin Time: 13.2 seconds (ref 11.4–15.2)

## 2017-02-12 LAB — MRSA PCR SCREENING: MRSA by PCR: NEGATIVE

## 2017-02-12 LAB — DIFFERENTIAL
Basophils Absolute: 0 10*3/uL (ref 0.0–0.1)
Basophils Relative: 0 %
Eosinophils Absolute: 0.1 10*3/uL (ref 0.0–0.7)
Eosinophils Relative: 1 %
Lymphocytes Relative: 33 %
Lymphs Abs: 2.7 10*3/uL (ref 0.7–4.0)
Monocytes Absolute: 0.7 10*3/uL (ref 0.1–1.0)
Monocytes Relative: 9 %
Neutro Abs: 4.7 10*3/uL (ref 1.7–7.7)
Neutrophils Relative %: 57 %

## 2017-02-12 LAB — APTT: aPTT: 36 seconds (ref 24–36)

## 2017-02-12 LAB — CBG MONITORING, ED: Glucose-Capillary: 116 mg/dL — ABNORMAL HIGH (ref 65–99)

## 2017-02-12 LAB — ETHANOL: Alcohol, Ethyl (B): <10 mg/dL (ref <10)

## 2017-02-12 MED ORDER — ACETAMINOPHEN 160 MG/5ML PO SOLN: 650.0000 mg | ORAL | Status: DC | PRN

## 2017-02-12 MED ORDER — ACETAMINOPHEN 325 MG PO TABS: 650.0000 mg | ORAL_TABLET | ORAL | Status: DC | PRN

## 2017-02-12 MED ORDER — AMLODIPINE BESYLATE 10 MG PO TABS
10.0000 mg | ORAL_TABLET | Freq: Every day | ORAL | Status: DC
  Administered 2017-02-12 – 2017-02-17 (×6): 10 mg via ORAL
  Filled 2017-02-12 (×6): qty 1
  Filled 2017-02-12: qty 2

## 2017-02-12 MED ORDER — PANTOPRAZOLE SODIUM 40 MG IV SOLR
40.0000 mg | Freq: Every day | INTRAVENOUS | Status: DC
  Administered 2017-02-12: 40 mg via INTRAVENOUS
  Filled 2017-02-12: qty 40

## 2017-02-12 MED ORDER — STROKE: EARLY STAGES OF RECOVERY BOOK
Freq: Once | Status: AC
  Administered 2017-02-14: 10:00:00
  Filled 2017-02-12: qty 1

## 2017-02-12 MED ORDER — ACETAMINOPHEN 650 MG RE SUPP: 650.0000 mg | RECTAL | Status: DC | PRN

## 2017-02-12 MED ORDER — ALTEPLASE (STROKE) FULL DOSE INFUSION
90.0000 mg | Freq: Once | INTRAVENOUS | Status: AC
  Administered 2017-02-12: 90 mg via INTRAVENOUS

## 2017-02-12 MED ORDER — SODIUM CHLORIDE 0.9 % IV SOLN
INTRAVENOUS | Status: DC
  Administered 2017-02-12 – 2017-02-13 (×2): via INTRAVENOUS

## 2017-02-12 MED ORDER — PIPERACILLIN-TAZOBACTAM 3.375 G IVPB 30 MIN: 3.3750 g | Freq: Once | INTRAVENOUS | Status: DC

## 2017-02-12 MED ORDER — VANCOMYCIN HCL IN DEXTROSE 1-5 GM/200ML-% IV SOLN: 1000.0000 mg | Freq: Once | INTRAVENOUS | Status: DC

## 2017-02-12 MED ORDER — IOPAMIDOL (ISOVUE-370) INJECTION 76%
INTRAVENOUS | Status: AC
  Administered 2017-02-12: 50 mL
  Filled 2017-02-12: qty 50

## 2017-02-12 NOTE — ED Notes (Addendum)
Patient on way to MRI. This nurse going with patient.

## 2017-02-12 NOTE — ED Notes (Signed)
Patient given [place] sandwich, applesauce, and coke.

## 2017-02-12 NOTE — H&P (Addendum)
Chief Complaint: Left side weakness  History obtained from: Patient and Chart     HPI:                                                                                                                                       [name] is an 52 y.o. male with PMH of HTN, obesity presents as stroke alert for sudden onset weakness and numbness in left side.  Patient states he was last known normal at 9 am and was working from home when he noticed his left arm and leg became numb and weak and called 911. EMS noted significant weakness on left side and unable to hold arm, unable to walk. BP was 160 systolic, FSG was normal. Patients symptoms improved on transport to [facility] ER, however continued to have mild weakness, loss of sensation, dysarthria on arrival. Unable to walk without support on assessment. Ct head was negative and he received tPA after 10mg  labetolol for blood pressure. No recent GI bleed/surgery.  CTA head and neck was negative for LVO. Takes amlodipine for HTN.   Date last known well: 02/12/17 Time last known well: 9 am tPA Given: yes NIHSS: 5 Baseline MRS 0   PMH Hypertension  Past Surgical History:  Procedure Laterality Date  . NO PAST SURGERIES      Family History  Problem Relation Age of Onset  . Hypertension Mother   . Hypertension Father   . Stroke Father   . Hypertension Sister    Social History:  reports that  has never smoked. he has never used smokeless tobacco. He reports that he drinks alcohol. He reports that he does not use drugs.  Allergies:  Allergies  Allergen Reactions  . Penicillins     headache    Medications:                                                                                                                        I reviewed home medications   ROS:  14 systems reviewed and negative except above   Examination:                                                                                                      General: Appears well-developed and well-nourished.  Psych: Affect appropriate to situation Eyes: No scleral injection HENT: No OP obstrucion Head: Normocephalic.  Cardiovascular: Normal rate and regular rhythm.  Respiratory: Effort normal and breath sounds normal to anterior ascultation GI: Soft.  No distension. There is no tenderness.  Skin: WDI   Neurological Examination Mental Status: Alert, oriented, thought content appropriate.  Speech fluent without evidence of aphasia. Able to follow 3 step commands without difficulty. Cranial Nerves: II: Visual fields grossly normal,  Li,IV, VI: ptosis not present, extra-ocular motions intact bilaterally, pupils equal, round, reactive to light and accommodation V,VII: mild left NFF VIII: hearing normal bilaterally IX,X: uvula rises symmetrically XI: bilateral shoulder shrug XII: midline tongue extension Motor: Right : Upper extremity   5/5    Left:     Upper extremity   4/5  Lower extremity   5/5     Lower extremity   4/5 Tone and bulk:normal tone throughout; no atrophy noted Sensory:  Reduced sensation in left arm, leg and face Deep Tendon Reflexes: 2+ and symmetric throughout Plantars: Right: downgoing   Left: downgoing Cerebellar: normal finger-to-nose, normal rapid alternating movements and normal heel-to-shin test Gait: normal gait and station     Lab Results: Basic Metabolic Panel: Recent Labs  Lab 02/12/17 1017  NA 141  K 3.8  CL 107  GLUCOSE 117*  BUN 21*  CREATININE 1.10    CBC: Recent Labs  Lab 02/12/17 1017 02/12/17 1019  WBC  --  8.2  NEUTROABS  --  4.7  HGB 16.0 16.4  HCT 47.0 46.6  MCV  --  89.6  PLT  --  245    Coagulation Studies: No results for input(s): LABPROT, INR in the last 72 hours.  Imaging: Ct Head Code Stroke Wo Contrast  Result Date: 02/12/2017 CLINICAL DATA:  Code  stroke. Acute presentation with left-sided numbness in weakness. Last seen normal 90 minutes ago. EXAM: CT HEAD WITHOUT CONTRAST TECHNIQUE: Contiguous axial images were obtained from the base of the skull through the vertex without intravenous contrast. COMPARISON:  None. FINDINGS: Brain: Normal appearance the supratentorial brain without evidence of old or acute infarction, mass lesion, hemorrhage, hydrocephalus or extra-axial collection. In the cerebellum, there are possible areas of low-density in the superior cerebellum on the right that could represent old or recent infarctions. No evidence of mass lesion, hemorrhage, hydrocephalus or extra-axial collection. Vascular:  No abnormal vascular findings. Skull: Normal Sinuses/Orbits: Clear/normal Other: None ASPECTS (Alberta Stroke Program Early CT Score) - Ganglionic level infarction (caudate, lentiform nuclei, internal capsule, insula, M1-M3 cortex): 7 - Supraganglionic infarction (M4-M6 cortex): 3 Total score (0-10 with 10 being normal): 10 IMPRESSION: 1. No abnormal supratentorial finding. No cause of left-sided numbness and weakness is identified. 2. ASPECTS is 10. Question areas of low-density in the right cerebellum that could be recent or old  infarctions. These results were communicated to at 10:29 amon 1/3/2019by text page via the [facility] messaging system. Electronically Signed   By: Paulina FusiMark  Shogry M.D.   On: 02/12/2017 10:31     ASSESSMENT AND PLAN  52 y.o. male with PMH of HTN, obesity presents as stroke alert for sudden onset weakness and numbness in left side. CT head for bleed and CTA negative for LVO .   Acute Ischemic Stroke   Risk factors:  HTN, obesity Etiology: likely small vessel disease   Plan: # MRI of the brain without contrast #Transthoracic Echo  # Start patient on ASA 325mg  daily #Start or continue Atorvastatin 80 mg/other high intensity statin # HBAIC and Lipid profile # Telemetry monitoring # Frequent neuro checks #   stroke swallow screen  Hypertension Permissive HTN upto 185/105 PRN labetolol  Obesity Recommend weight reduction, heart healthy diet and exercise  DVT PPX: SCD   This patient is neurologically critically ill due to acute stroke. He received ivTPA after discussion of risk vs benefit. He is at risk for significant risk of neurological worsening  hemorrhagic conversion, infection, respiratory failure and seizure. This patient's care requires constant monitoring of vital signs, hemodynamics, respiratory and cardiac monitoring,, neurological assessment, and medical decision making of high complexity.  I spent 45  minutes of neurocritical time in the care of this patient.     Sushanth Aroor [facility] Pager Number [phone]

## 2017-02-12 NOTE — ED Provider Notes (Signed)
[facility] EMERGENCY DEPARTMENT Provider Note   CSN: [id number] Arrival date & time: 02/12/17  1008     History   Chief Complaint No chief complaint on file.   HPI Aime Meloche III is a 52 y.o. male.  HPI  Level 5 caveat secondary to acuity of condition and urgent need for intervention.  HO obtained from nursing and patient.  52 y.o. Male presents as code stroke.  I saw and evaluated the patient in the ct scanner. LKN 0900   Patient working from home alone on computer when noted left facial numbness then left side weakness.  Patient called 911.  RN reports on arrival no  Obvious left side weakness but unable to stand.  Initial sbp 170s and patient received labetalol 10 mg iv.  Neuro team at bedside.    History reviewed. No pertinent past medical history.  Patient Active Problem List   Diagnosis Date Noted  . Allergic rhinitis 11/07/2016  . BMI 39.0-39.9,adult 11/07/2016  . Benign essential HTN 10/18/2016    Past Surgical History:  Procedure Laterality Date  . NO PAST SURGERIES         Home Medications    Prior to Admission medications   Medication Sig Start Date End Date Taking? Authorizing Provider  amLODipine (NORVASC) 10 MG tablet Take 1 tablet (10 mg total) by mouth daily. 11/07/16   Porfirio Oar, PA-C  fluticasone (FLONASE) 50 MCG/ACT nasal spray Place 2 sprays into both nostrils daily. 11/07/16   Porfirio Oar, PA-C    Family History Family History  Problem Relation Age of Onset  . Hypertension Mother   . Hypertension Father   . Stroke Father   . Hypertension Sister     Social History Social History   Tobacco Use  . Smoking status: Never Smoker  . Smokeless tobacco: Never Used  Substance Use Topics  . Alcohol use: Yes    Alcohol/week: 0.0 oz    Comment: rarely, 1-2 times/year  . Drug use: No     Allergies   Penicillins   Review of Systems Review of Systems  Unable to perform ROS: Acuity of condition      Physical Exam Updated Vital Signs Wt 129.2 kg (284 lb 13.4 oz)   BMI 40.29 kg/m   Physical Exam  Constitutional: He is oriented to person, place, and time. He appears well-developed and well-nourished. No distress.  HENT:  Head: Normocephalic and atraumatic.  Right Ear: External ear normal.  Left Ear: External ear normal.  Eyes: EOM are normal. Pupils are equal, round, and reactive to light.  Neck: Normal range of motion.  Cardiovascular: Normal rate.  Abdominal: Bowel sounds are normal.  Musculoskeletal: Normal range of motion.  Neurological: He is alert and oriented to person, place, and time.  Facial asymmetry noted Patient stopped onto CT scanner and unable to assess upper and lower extremity strength please see neurology note  Skin: Skin is warm. Capillary refill takes less than 2 seconds.  Nursing note and vitals reviewed.    ED Treatments / Results  Labs (all labs ordered are listed, but only abnormal results are displayed) Labs Reviewed  CBG MONITORING, ED - Abnormal; Notable for the following components:      Result Value   Glucose-Capillary 116 (*)    All other components within normal limits  I-STAT CHEM 8, ED - Abnormal; Notable for the following components:   BUN 21 (*)    Glucose, Bld 117 (*)    All other  components within normal limits  CBC  DIFFERENTIAL  ETHANOL  PROTIME-INR  APTT  COMPREHENSIVE METABOLIC PANEL  RAPID URINE DRUG SCREEN, HOSP PERFORMED  URINALYSIS, ROUTINE W REFLEX MICROSCOPIC  I-STAT CHEM 8, ED  I-STAT TROPONIN, ED    EKG  EKG Interpretation None       Radiology Ct Head Code Stroke Wo Contrast  Result Date: 02/12/2017 CLINICAL DATA:  Code stroke. Acute presentation with left-sided numbness in weakness. Last seen normal 90 minutes ago. EXAM: CT HEAD WITHOUT CONTRAST TECHNIQUE: Contiguous axial images were obtained from the base of the skull through the vertex without intravenous contrast. COMPARISON:  None. FINDINGS:  Brain: Normal appearance the supratentorial brain without evidence of old or acute infarction, mass lesion, hemorrhage, hydrocephalus or extra-axial collection. In the cerebellum, there are possible areas of low-density in the superior cerebellum on the right that could represent old or recent infarctions. No evidence of mass lesion, hemorrhage, hydrocephalus or extra-axial collection. Vascular:  No abnormal vascular findings. Skull: Normal Sinuses/Orbits: Clear/normal Other: None ASPECTS (Alberta Stroke Program Early CT Score) - Ganglionic level infarction (caudate, lentiform nuclei, internal capsule, insula, M1-M3 cortex): 7 - Supraganglionic infarction (M4-M6 cortex): 3 Total score (0-10 with 10 being normal): 10 IMPRESSION: 1. No abnormal supratentorial finding. No cause of left-sided numbness and weakness is identified. 2. ASPECTS is 10. Question areas of low-density in the right cerebellum that could be recent or old infarctions. These results were communicated to at 10:29 amon 1/3/2019by text page via the [facility] messaging system. Electronically Signed   By: Paulina FusiMark  Shogry M.D.   On: 02/12/2017 10:31    Procedures Procedures (including critical care time)  Medications Ordered in ED Medications  alteplase (ACTIVASE) 1 mg/mL infusion 90 mg (not administered)  iopamidol (ISOVUE-370) 76 % injection (50 mLs  Contrast Given 02/12/17 1030)     Initial Impression / Assessment and Plan / ED Course  I have reviewed the triage vital signs and the nursing notes.  Pertinent labs & imaging results that were available during my care of the patient were reviewed by me and considered in my medical decision making (see chart for details).     11:34 AM Patient reexamined.  He fell when he first had weakness at home.  He thinks he struck his left upper abdomen against a sink.  He has not complaining of pain there. Lungs are clear to auscultation Heart is regular rate and rhythm Abdomen mild erythema left upper  abdomen nontender to palpation. This will require ongoing observation as he is getting TPA.  I do not feel he currently requires any imaging of this area.  1 stroke patient being admitted by neuro hospitalist is receiving TPA 2 patient with abdominal wall contusion after fall secondary #1 will require reevaluation.  Does not appear to need imaging at this time. Final Clinical Impressions(s) / ED Diagnoses   Final diagnoses:  Cerebrovascular accident (CVA), unspecified mechanism [facility](HCC)    ED Discharge Orders    None       Margarita Grizzleay, Argus Caraher, MD 02/12/17 1136

## 2017-02-12 NOTE — ED Notes (Signed)
Stroke team paged to RN Stacy per her request

## 2017-02-12 NOTE — ED Notes (Signed)
Patient complaining of right elbow pain due to falling in bathroom this morning.  No deformity or swelling noted at this time.

## 2017-02-12 NOTE — ED Notes (Signed)
Heart healthy carb modified dinner tray ordered @ 1726

## 2017-02-12 NOTE — ED Notes (Signed)
Patient unable to be monitored in MRI with cardiac/bp/pulse ox.  This nurse at MRI making sure patient doesn't begin to portray any neuro symptoms/deficits.

## 2017-02-12 NOTE — ED Notes (Signed)
Patient was given Labetalol 10 mg given per order Dr. Laurence SlateAroor. B/p 170/91,.

## 2017-02-12 NOTE — ED Notes (Signed)
Dr. Laurence SlateAroor called back and stated that he saw the message that I sent regarding patient having pain and tingling on left side of face.  No verbal orders given.

## 2017-02-12 NOTE — Code Documentation (Signed)
52[facility].  Patient from home where he was working from his computer at 0900.  He reports feeling left hand numbness at that time.  He got up and went to the BR and stumbled in the BR.  He called 911 and EMS activated a code stroke for left facial droop and left sided weakness.  Stroke team at the bedside on patient arrival.  Labs drawn and patient cleared for CT by Will, ED PA. EMS reporting patient with improvement since assessment on scene.  Patient with h/o HTN.  Patient to CT with team.  Patient ambulated in CT and was unsteady.  NIHSS 5, see documentation for details and code stroke times.  Patient with mild left facial weakness, left arm and leg drift, decreased sensation on the left side and mild dysarthria on exam.  SBP 179.  CT completed.  Labetalol 10mg  IVP given.  2nd PIV placed.  Pharmacy at bedside and notified to mix tPA.  SBP 170.  9mg  tPA bolus given at 1028 over 1 minute followed by 81mg /hr for a total of 90mg  per pharmacy dosing.  CTA head and neck completed.  Patient to C28.  Patient reassessed with improvement on exam.  Patient's wife to the bedside and updated on plan of care.  Patient to be admitted to 4N ICU when bed available.  NS flush hung behind tPA infusion.  Patient reporting mild headache, however, patient did not eat this morning - continue to monitor.  Bedside handoff with ED RN Kennyth ArnoldStacy.

## 2017-02-12 NOTE — ED Notes (Signed)
Dr. Laurence SlateAroor paged to RN Kennyth ArnoldStacy per her request

## 2017-02-12 NOTE — ED Notes (Signed)
Patient has and abrasion to his left side of abd. From falling in the bathroom.

## 2017-02-12 NOTE — ED Triage Notes (Signed)
States he got up at 7am was working . He works from home got up to go to the bathroom around 9am walked into the bathroom his left hand starting feeling funny he became dizzy and fell into the floor, states he drug himself down the hallway and called 911. States he was able to put his own shorts on however he was dizzy and everything was a blur.

## 2017-02-12 NOTE — ED Notes (Addendum)
Pt complaining of left facial pain.  States it feel like it is in his sinus.  Describes pain as tingling.  Stroke team paged. Patient has been laid flat on stretcher.

## 2017-02-13 ENCOUNTER — Inpatient Hospital Stay (HOSPITAL_COMMUNITY): Payer: BLUE CROSS/BLUE SHIELD

## 2017-02-13 ENCOUNTER — Other Ambulatory Visit: Payer: Self-pay

## 2017-02-13 ENCOUNTER — Encounter (HOSPITAL_COMMUNITY): Payer: Self-pay

## 2017-02-13 DIAGNOSIS — I639 Cerebral infarction, unspecified: Secondary | ICD-10-CM

## 2017-02-13 DIAGNOSIS — I351 Nonrheumatic aortic (valve) insufficiency: Secondary | ICD-10-CM

## 2017-02-13 DIAGNOSIS — E785 Hyperlipidemia, unspecified: Secondary | ICD-10-CM

## 2017-02-13 DIAGNOSIS — I6340 Cerebral infarction due to embolism of unspecified cerebral artery: Secondary | ICD-10-CM

## 2017-02-13 DIAGNOSIS — I6330 Cerebral infarction due to thrombosis of unspecified cerebral artery: Secondary | ICD-10-CM

## 2017-02-13 DIAGNOSIS — I10 Essential (primary) hypertension: Secondary | ICD-10-CM

## 2017-02-13 DIAGNOSIS — G8194 Hemiplegia, unspecified affecting left nondominant side: Secondary | ICD-10-CM

## 2017-02-13 LAB — LIPID PANEL
Cholesterol: 185 mg/dL (ref 0–200)
HDL: 28 mg/dL — ABNORMAL LOW (ref >40)
LDL Cholesterol: 137 mg/dL — ABNORMAL HIGH (ref 0–99)
Total CHOL/HDL Ratio: 6.6 RATIO
Triglycerides: 98 mg/dL (ref <150)
VLDL: 20 mg/dL (ref 0–40)

## 2017-02-13 LAB — BASIC METABOLIC PANEL
Anion gap: 8 (ref 5–15)
BUN: 11 mg/dL (ref 6–20)
CO2: 22 mmol/L (ref 22–32)
Calcium: 8.8 mg/dL — ABNORMAL LOW (ref 8.9–10.3)
Chloride: 106 mmol/L (ref 101–111)
Creatinine, Ser: 0.99 mg/dL (ref 0.61–1.24)
GFR calc Af Amer: >60 mL/min (ref >60)
GFR calc non Af Amer: >60 mL/min (ref >60)
Glucose, Bld: 110 mg/dL — ABNORMAL HIGH (ref 65–99)
Potassium: 3.7 mmol/L (ref 3.5–5.1)
Sodium: 136 mmol/L (ref 135–145)

## 2017-02-13 LAB — ECHOCARDIOGRAM COMPLETE
Height: 71 in
Weight: 4522.08 oz

## 2017-02-13 LAB — CBC
HCT: 43.3 % (ref 39.0–52.0)
Hemoglobin: 14.8 g/dL (ref 13.0–17.0)
MCH: 30.6 pg (ref 26.0–34.0)
MCHC: 34.2 g/dL (ref 30.0–36.0)
MCV: 89.6 fL (ref 78.0–100.0)
Platelets: 248 10*3/uL (ref 150–400)
RBC: 4.83 MIL/uL (ref 4.22–5.81)
RDW: 12.7 % (ref 11.5–15.5)
WBC: 8.1 10*3/uL (ref 4.0–10.5)

## 2017-02-13 LAB — HEMOGLOBIN A1C
Hgb A1c MFr Bld: 5.5 % (ref 4.8–5.6)
Mean Plasma Glucose: 111.15 mg/dL

## 2017-02-13 LAB — RPR: RPR Ser Ql: NONREACTIVE

## 2017-02-13 LAB — HIV ANTIBODY (ROUTINE TESTING W REFLEX): HIV Screen 4th Generation wRfx: NONREACTIVE

## 2017-02-13 MED ORDER — ENOXAPARIN SODIUM 40 MG/0.4ML ~~LOC~~ SOLN
40.0000 mg | SUBCUTANEOUS | Status: DC
  Administered 2017-02-13 – 2017-02-16 (×4): 40 mg via SUBCUTANEOUS
  Filled 2017-02-13 (×4): qty 0.4

## 2017-02-13 MED ORDER — LABETALOL HCL 5 MG/ML IV SOLN: 10.0000 mg | INTRAVENOUS | Status: DC | PRN

## 2017-02-13 MED ORDER — LISINOPRIL 20 MG PO TABS
20.0000 mg | ORAL_TABLET | Freq: Every day | ORAL | Status: DC
  Administered 2017-02-13 – 2017-02-17 (×5): 20 mg via ORAL
  Filled 2017-02-13 (×6): qty 1

## 2017-02-13 MED ORDER — ASPIRIN EC 325 MG PO TBEC
325.0000 mg | DELAYED_RELEASE_TABLET | Freq: Every day | ORAL | Status: DC
  Administered 2017-02-13 – 2017-02-17 (×5): 325 mg via ORAL
  Filled 2017-02-13 (×6): qty 1

## 2017-02-13 MED ORDER — PANTOPRAZOLE SODIUM 40 MG PO TBEC
40.0000 mg | DELAYED_RELEASE_TABLET | Freq: Every day | ORAL | Status: DC
  Administered 2017-02-13 – 2017-02-17 (×5): 40 mg via ORAL
  Filled 2017-02-13 (×6): qty 1

## 2017-02-13 MED ORDER — ATORVASTATIN CALCIUM 40 MG PO TABS
40.0000 mg | ORAL_TABLET | Freq: Every day | ORAL | Status: DC
  Administered 2017-02-13 – 2017-02-16 (×3): 40 mg via ORAL
  Filled 2017-02-13 (×4): qty 1

## 2017-02-13 NOTE — Progress Notes (Addendum)
    [facility] has been requested to perform a transesophageal echocardiogram on 02/16/17 for Stroke.  After careful review of history and examination, the risks and benefits of transesophageal echocardiogram have been explained including risks of esophageal damage, perforation (1:10,000 risk), bleeding, pharyngeal hematoma as well as other potential complications associated with conscious sedation including aspiration, arrhythmia, respiratory failure and death. Alternatives to treatment were discussed, questions were answered. Patient is willing to proceed.   Judy PimpleKrista Kroeger, PA-C 02/13/2017 1:45 PM

## 2017-02-13 NOTE — Evaluation (Signed)
Occupational Therapy Evaluation Patient Details Name: [name] MRN: [id number] DOB: 1965-08-30 Today's Date: 02/13/2017    History of Present Illness 52 y.o. male with PMH of HTN, obesity admitted with sudden onset weakness and numbness in left side. MRI=right posterior insula and right lateral parietal lobe infarct with small right cerebellar infarct s/p tPA   Clinical Impression   Pt reports he was independent with ADL PTA. Currently pt requires mod assist +2 for functional mobility and mod-max assist overall for ADL. Pt presenting with L sided impaired sensation and coordination, poor balance, impaired cognition impacting his independence and safety with ADL and functional mobility. Recommending CIR level therapies to maximize independence and safety with ADL and functional mobility prior to return home. Pt would benefit from continued skilled OT to address established goals.    Follow Up Recommendations  CIR;Supervision/Assistance - 24 hour    Equipment Recommendations  Other (comment)(TBD at next venue)    Recommendations for Other Services       Precautions / Restrictions Precautions Precautions: Fall Restrictions Weight Bearing Restrictions: No      Mobility Bed Mobility Overal bed mobility: Needs Assistance Bed Mobility: Supine to Sit     Supine to sit: Min assist     General bed mobility comments: pt with rail, HOB 20 degrees with increased time and cues for balance and safety as pt leaning left and needing assist to achieve full midline and cues to scoot to EOB  Transfers Overall transfer level: Needs assistance Equipment used: 2 person hand held assist Transfers: Sit to/from Stand Sit to Stand: Min assist;+2 safety/equipment         General transfer comment: cues for hand placement and safety with cues to achieve midline in standing due to maintain left lean in standing    Balance Overall balance assessment: Needs assistance Sitting-balance  support: Feet supported Sitting balance-Leahy Scale: Fair     Standing balance support: Bilateral upper extremity supported Standing balance-Leahy Scale: Poor Standing balance comment: mod +2 assist for balance                           ADL either performed or assessed with clinical judgement   ADL Overall ADL's : Needs assistance/impaired     Grooming: Minimal assistance;Sitting   Upper Body Bathing: Moderate assistance;Sitting   Lower Body Bathing: Maximal assistance;Sit to/from stand   Upper Body Dressing : Moderate assistance;Sitting   Lower Body Dressing: Maximal assistance;Sit to/from stand   Toilet Transfer: Moderate assistance;+2 for physical assistance;Ambulation Toilet Transfer Details (indicate cue type and reason): Simulated Toileting- Clothing Manipulation and Hygiene: Moderate assistance;Sit to/from stand Toileting - Clothing Manipulation Details (indicate cue type and reason): for peri care in standing     Functional mobility during ADLs: Moderate assistance;+2 for physical assistance(2 person HHA)       Vision Baseline Vision/History: Wears glasses Wears Glasses: At all times Patient Visual Report: No change from baseline Additional Comments: Needs further assessment     Perception     Praxis      Pertinent Vitals/Pain Pain Assessment: No/denies pain     Hand Dominance Right   Extremity/Trunk Assessment Upper Extremity Assessment Upper Extremity Assessment: LUE deficits/detail LUE Deficits / Details: Grossly 3/5, poor coordination  LUE Sensation: decreased light touch LUE Coordination: decreased fine motor;decreased gross motor   Lower Extremity Assessment Lower Extremity Assessment: Defer to PT evaluation LLE Sensation: decreased light touch   Cervical / Trunk Assessment Cervical /  Trunk Assessment: Other exceptions Cervical / Trunk Exceptions: rounded shoulders, forward head   Communication Communication Communication: No  difficulties   Cognition Arousal/Alertness: Awake/alert Behavior During Therapy: WFL for tasks assessed/performed Overall Cognitive Status: Impaired/Different from baseline Area of Impairment: Safety/judgement                         Safety/Judgement: Decreased awareness of deficits;Decreased awareness of safety     General Comments: pt unaware of left lean in sitting or standing   General Comments       Exercises     Shoulder Instructions      Home Living Family/patient expects to be discharged to:: Private residence Living Arrangements: Spouse/significant other Available Help at Discharge: Family;Available 24 hours/day(if needed, wife works typically) Type of Home: House Home Access: Stairs to enter [employer] CorporationEntrance Stairs-Number of Steps: 1   Home Layout: One level     Bathroom Shower/Tub: Tub/shower unit;Door   Foot LockerBathroom Toilet: Standard     Home Equipment: None          Prior Functioning/Environment Level of Independence: Independent        Comments: Works in Psychologist, clinicalsales for construction, works from home        OT Problem List: Decreased strength;Decreased activity tolerance;Impaired balance (sitting and/or standing);Decreased coordination;Decreased cognition;Decreased safety awareness;Decreased knowledge of use of DME or AE;Impaired sensation;Obesity;Impaired UE functional use      OT Treatment/Interventions: Self-care/ADL training;Therapeutic exercise;Neuromuscular education;DME and/or AE instruction;Energy conservation;Therapeutic activities;Cognitive remediation/compensation;Patient/family education;Balance training    OT Goals(Current goals can be found in the care plan section) Acute Rehab OT Goals Patient Stated Goal: return to work at home, watch sports, go to [employer] ScientificHockey game OT Goal Formulation: With patient/family Time For Goal Achievement: 02/27/17 Potential to Achieve Goals: Good ADL Goals Pt Will Perform Grooming: with min guard  assist;sitting Pt Will Transfer to Toilet: with min assist;bedside commode;ambulating Pt Will Perform Toileting - Clothing Manipulation and hygiene: with min assist;sit to/from stand Pt/caregiver will Perform Home Exercise Program: Left upper extremity;Increased strength;Independently;With written HEP provided;With theraputty(increased fine motor coordination)  OT Frequency: Min 3X/week   Barriers to D/C:            Co-evaluation PT/OT/SLP Co-Evaluation/Treatment: Yes Reason for Co-Treatment: Complexity of the patient's impairments (multi-system involvement);For patient/therapist safety PT goals addressed during session: Mobility/safety with mobility;Balance OT goals addressed during session: ADL's and self-care      AM-PAC PT "6 Clicks" Daily Activity     Outcome Measure Help from another person eating meals?: A Little Help from another person taking care of personal grooming?: A Little Help from another person toileting, which includes using toliet, bedpan, or urinal?: A Lot Help from another person bathing (including washing, rinsing, drying)?: A Lot Help from another person to put on and taking off regular upper body clothing?: A Lot Help from another person to put on and taking off regular lower body clothing?: A Lot 6 Click Score: 14   End of Session Equipment Utilized During Treatment: Gait belt Nurse Communication: Mobility status  Activity Tolerance: Patient tolerated treatment well Patient left: in chair;with call bell/phone within reach;with chair alarm set;with family/visitor present  OT Visit Diagnosis: Unsteadiness on feet (R26.81);Other abnormalities of gait and mobility (R26.89);Hemiplegia and hemiparesis Hemiplegia - Right/Left: Left Hemiplegia - dominant/non-dominant: Non-Dominant Hemiplegia - caused by: Cerebral infarction                Time: 1610-9604: 1112-1133 OT Time Calculation (min): 21 min Charges:  OT General Charges $  OT Visit: 1 Visit OT Evaluation $OT  Eval Moderate Complexity: 1 Mod G-Codes:     Marilouise Densmore A. Brett Albino, M.S., OTR/L Pager: [phone]  Gaye Alken 02/13/2017, 2:00 PM

## 2017-02-13 NOTE — Progress Notes (Signed)
Inpatient Rehabilitation  Per PT request, patient was screened by Conswella Bruney for appropriateness for an Inpatient Acute Rehab consult.  At this time we are recommending an Inpatient Rehab consult.  Please order if you are agreeable.    Raynold Blankenbaker, M.A., CCC/SLP Admission Coordinator  [facility]  Cell [phone]  

## 2017-02-13 NOTE — Progress Notes (Signed)
*  PRELIMINARY RESULTS* Vascular Ultrasound Transcranial Doppler with Bubbles has been completed.   There is no evidence of high intensity transient signals (HITS). Therefore, there is no obvious evidence of patent foramen ovale (PFO).  02/13/2017 3:32 PM Gertie FeyMichelle Jayion Schneck, BS, RVT, RDCS, RDMS

## 2017-02-13 NOTE — Progress Notes (Signed)
Pt admitted to the unit as a transfer from 4N. Pt A&O x4; pt VSS; telemetry applied and verified with CCMD; second RN called to second verify. Pt oriented to the unit and room; fall/safety precaution/prevention education completed with pt and spouse at bedside; both voices understanding and denies any questions. Pt in bed with call light within reach and bed alarm on; reported off to oncoming RN. Dionne BucyP. Amo Tiyanna Larcom RN

## 2017-02-13 NOTE — Progress Notes (Signed)
I will follow up on Monday with pt and family to discuss goals and expectations of an inpt rehab admit. Admission pending [employer] insurance approval and bed availability when pt medically ready for d/c. [phone]

## 2017-02-13 NOTE — Progress Notes (Signed)
  Echocardiogram 2D Echocardiogram has been performed.  Celene SkeenVijay  Argelio Granier 02/13/2017, 2:37 PM

## 2017-02-13 NOTE — Evaluation (Addendum)
Physical Therapy Evaluation Patient Details Name: Jesus IlesChester Balderrama III MRN: [id number] DOB: 1965-12-02 Today's Date: 02/13/2017   History of Present Illness  52 y.o. male with PMH of HTN, obesity admitted with sudden onset weakness and numbness in left side. 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 Pt with 5/5 myotome testing bil LE and pt in sitting with testing reports equal sensation bil LE however, in standing clearly demonstrates decreased sensation and proprioception of LLE. Pt with decreased transfers, decreased strength LUE, gait and functional mobility requiring 2 person assist for balance and mobility who will benefit from acute therapy to maximize mobility, balance, gait and independence to decrease burden of care.      Follow Up Recommendations CIR;Supervision/Assistance - 24 hour    Equipment Recommendations  Other (comment)(TBD)    Recommendations for Other Services Rehab consult     Precautions / Restrictions Precautions Precautions: Fall Restrictions Weight Bearing Restrictions: No      Mobility  Bed Mobility Overal bed mobility: Needs Assistance Bed Mobility: Supine to Sit     Supine to sit: Min assist     General bed mobility comments: pt with rail, HOB 20 degrees with increased time and cues for balance and safety as pt leaning left and needing assist to achieve full midline and cues to scoot to EOB  Transfers Overall transfer level: Needs assistance   Transfers: Sit to/from Stand Sit to Stand: Min assist;+2 safety/equipment         General transfer comment: cues for hand placement and safety with cues to achieve midline in standing due to maintain left lean in standing  Ambulation/Gait Ambulation/Gait assistance: Mod assist;+2 physical assistance;+2 safety/equipment Ambulation Distance (Feet): 55  Feet Assistive device: 2 person hand held assist Gait Pattern/deviations: Step-to pattern;Decreased stride length;Narrow base of support;Drifts right/left   Gait velocity interpretation: Below normal speed for age/gender General Gait Details: pt with maintained left lean with decreased use of LUE and mod assist for balance throughout +2 with cues for safety and midline throughout. Pt with short step length with decreased clearance and awareness of left foot placement with stepping and cues to sequence, mod cues throughout  Stairs            Wheelchair Mobility    Modified Rankin (Stroke Patients Only) Modified Rankin (Stroke Patients Only) Pre-Morbid Rankin Score: No symptoms Modified Rankin: Moderately severe disability     Balance Overall balance assessment: Needs assistance   Sitting balance-Leahy Scale: Fair       Standing balance-Leahy Scale: Poor Standing balance comment: mod +2 assist for balance                             Pertinent Vitals/Pain Pain Assessment: No/denies pain    Home Living Family/patient expects to be discharged to:: Private residence Living Arrangements: Spouse/significant other Available Help at Discharge: Family;Available 24 hours/day(if needed, wife works typically) Type of Home: House Home Access: Stairs to enter   Secretary/administratorntrance Stairs-Number of Steps: 1 Home Layout: One level Home Equipment: None      Prior Function Level of Independence: Independent         Comments: Works in [employer], clinicalsales for construction, works from home     Higher education careers adviserHand Dominance   Dominant Hand: Right    Extremity/Trunk Assessment   Upper Extremity Assessment Upper Extremity Assessment: Defer  to OT evaluation    Lower Extremity Assessment Lower Extremity Assessment: LLE deficits/detail LLE Sensation: decreased light touch    Cervical / Trunk Assessment Cervical / Trunk Assessment: Other exceptions Cervical / Trunk Exceptions: rounded shoulders,  forward head  Communication   Communication: No difficulties  Cognition Arousal/Alertness: Awake/alert Behavior During Therapy: WFL for tasks assessed/performed Overall Cognitive Status: Impaired/Different from baseline Area of Impairment: Safety/judgement                         Safety/Judgement: Decreased awareness of deficits;Decreased awareness of safety     General Comments: pt unaware of left lean in sitting or standing      General Comments      Exercises     Assessment/Plan    PT Assessment Patient needs continued PT services  PT Problem List Decreased strength;Decreased mobility;Decreased safety awareness;Decreased coordination;Decreased knowledge of precautions;Decreased activity tolerance;Decreased cognition;Decreased balance;Decreased knowledge of use of DME       PT Treatment Interventions Gait training;Therapeutic exercise;Patient/family education;Balance training;Functional mobility training;Neuromuscular re-education;DME instruction;Therapeutic activities;Cognitive remediation    PT Goals (Current goals can be found in the Care Plan section)  Acute Rehab PT Goals Patient Stated Goal: return to work at home, watch sports, go to [employer] PT Goal Formulation: With patient/family Time For Goal Achievement: 02/27/17 Potential to Achieve Goals: Good    Frequency Min 4X/week   Barriers to discharge   pt lives with spouse who works but she can take off for short time from work    Co-evaluation PT/OT/SLP Co-Evaluation/Treatment: Yes Reason for Co-Treatment: Complexity of the patient's impairments (multi-system involvement);For patient/therapist safety PT goals addressed during session: Mobility/safety with mobility;Balance         AM-PAC PT "6 Clicks" Daily Activity  Outcome Measure Difficulty turning over in bed (including adjusting bedclothes, sheets and blankets)?: A Little Difficulty moving from lying on back to sitting on the side of the  bed? : A Lot Difficulty sitting down on and standing up from a chair with arms (e.g., wheelchair, bedside commode, etc,.)?: Unable Help needed moving to and from a bed to chair (including a wheelchair)?: A Lot Help needed walking in hospital room?: A Lot Help needed climbing 3-5 steps with a railing? : Total 6 Click Score: 11    End of Session Equipment Utilized During Treatment: Gait belt Activity Tolerance: Patient tolerated treatment well Patient left: in chair;with call bell/phone within reach;with chair alarm set;with family/visitor present Nurse Communication: Mobility status;Precautions PT Visit Diagnosis: Other abnormalities of gait and mobility (R26.89);Unsteadiness on feet (R26.81);Other symptoms and signs involving the nervous system (W09.811)    Time: 9147-8295 PT Time Calculation (min) (ACUTE ONLY): 22 min   Charges:   PT Evaluation $PT Eval Moderate Complexity: 1 Mod     PT G Codes:        Delaney Meigs, PT [phone]   Jason Hauge B Lonzo Saulter 02/13/2017, 1:14 PM

## 2017-02-13 NOTE — Progress Notes (Signed)
PT Cancellation Note  Patient Details Name: [name] MRN: [id number] DOB: 16-Sep-1965   Cancelled Treatment:    Reason Eval/Treat Not Completed: Medical issues which prohibited therapy(pt on strict bedrest s/p tPA. await increased activity order)   [name] 02/13/2017, 7:23 AM  [name], PT [phone]

## 2017-02-13 NOTE — Consult Note (Signed)
Physical Medicine and Rehabilitation Consult   Reason for Consult:  Stroke with functional deficits Referring Physician:  Dr. Roda Shutters   HPI: [name] is a 52 y.o. male with history of HTN who was admitted on 02/12/17 with left facial and left sided weakness with numbness. MRI brain done showing "small areas of acute/early subacute cortical infarction in right posterior insula and right lateral parietal lobe as well as very small right superior cerebellar subacute infarct". UDS negative and work up underway.  Dr. Roda Shutters recommended work up for embolic source and TEE planned for Monday.  Therapy evaluations done today showing deficits in balance, mobility and ADLs. Functional deficits.    Review of Systems  Constitutional: Negative for fever.  HENT: Negative for hearing loss and tinnitus.   Eyes: Negative for blurred vision and double vision.  Respiratory: Negative for cough and hemoptysis.   Cardiovascular: Negative for chest pain and leg swelling.  Gastrointestinal: Negative for heartburn and nausea.  Genitourinary: Negative for dysuria and urgency.  Musculoskeletal: Negative for back pain and myalgias.  Neurological: Positive for sensory change, speech change and focal weakness. Negative for dizziness, weakness and headaches.  Psychiatric/Behavioral: The patient is not nervous/anxious and does not have insomnia.       Past Medical History:  Diagnosis Date  . Hypertension 2018    Past Surgical History:  Procedure Laterality Date  . NO PAST SURGERIES      Family History  Problem Relation Age of Onset  . Hypertension Mother   . Hypertension Father   . Stroke Father   . Hypertension Sister     Social History:  reports that  has never smoked. he has never used smokeless tobacco. He reports that he drinks alcohol. He reports that he does not use drugs.   Allergies  Allergen Reactions  . Penicillins Other (See Comments)    Headache Has patient had a PCN reaction  causing immediate rash, facial/tongue/throat swelling, SOB or lightheadedness with hypotension: No Has patient had a PCN reaction causing severe rash involving mucus membranes or skin necrosis: No Has patient had a PCN reaction that required hospitalization: No Has patient had a PCN reaction occurring within the last 10 years:No If all of the above answers are "NO", then may proceed with Cephalosporin use.     Medications Prior to Admission  Medication Sig Dispense Refill  . acetaminophen (TYLENOL) 325 MG tablet Take 650 mg by mouth every 6 (six) hours as needed for mild pain.    Marland Kitchen amLODipine (NORVASC) 10 MG tablet Take 1 tablet (10 mg total) by mouth daily. 90 tablet 3  . cetirizine (ZYRTEC) 10 MG tablet Take 10 mg by mouth daily as needed for allergies.    Marland Kitchen ibuprofen (ADVIL,MOTRIN) 200 MG tablet Take 200 mg by mouth every 6 (six) hours as needed for moderate pain.      Home: Home Living Family/patient expects to be discharged to:: Private residence Living Arrangements: Spouse/significant other Available Help at Discharge: Family, Available 24 hours/day(if needed, wife works typically) Type of Home: House Home Access: Stairs to enter Secretary/administrator of Steps: 1 Home Layout: One level Bathroom Shower/Tub: Hydrographic surveyor, Sport and exercise psychologist: Standard Home Equipment: None  Functional History: Prior Function Level of Independence: Independent Comments: Works in Psychologist, clinical, works from home Functional Status:  Mobility: Bed Mobility Overal bed mobility: Needs Assistance Bed Mobility: Supine to Sit Supine to sit: Min assist General bed mobility comments: pt with rail, HOB 20  degrees with increased time and cues for balance and safety as pt leaning left and needing assist to achieve full midline and cues to scoot to EOB Transfers Overall transfer level: Needs assistance Equipment used: 2 person hand held assist Transfers: Sit to/from Stand Sit to Stand: Min  assist, +2 safety/equipment General transfer comment: cues for hand placement and safety with cues to achieve midline in standing due to maintain left lean in standing Ambulation/Gait Ambulation/Gait assistance: Mod assist, +2 physical assistance, +2 safety/equipment Ambulation Distance (Feet): 55 Feet Assistive device: 2 person hand held assist Gait Pattern/deviations: Step-to pattern, Decreased stride length, Narrow base of support, Drifts right/left General Gait Details: pt with maintained left lean with decreased use of LUE and mod assist for balance throughout +2 with cues for safety and midline throughout. Pt with short step length with decreased clearance and awareness of left foot placement with stepping and cues to sequence Gait velocity interpretation: Below normal speed for age/gender    ADL: ADL Overall ADL's : Needs assistance/impaired Grooming: Minimal assistance, Sitting Upper Body Bathing: Moderate assistance, Sitting Lower Body Bathing: Maximal assistance, Sit to/from stand Upper Body Dressing : Moderate assistance, Sitting Lower Body Dressing: Maximal assistance, Sit to/from stand Toilet Transfer: Moderate assistance, +2 for physical assistance, Ambulation Toilet Transfer Details (indicate cue type and reason): Simulated Toileting- Clothing Manipulation and Hygiene: Moderate assistance, Sit to/from stand Toileting - Clothing Manipulation Details (indicate cue type and reason): for peri care in standing Functional mobility during ADLs: Moderate assistance, +2 for physical assistance(2 person HHA)  Cognition: Cognition Overall Cognitive Status: Impaired/Different from baseline Orientation Level: Oriented X4 Cognition Arousal/Alertness: Awake/alert Behavior During Therapy: WFL for tasks assessed/performed Overall Cognitive Status: Impaired/Different from baseline Area of Impairment: Safety/judgement Safety/Judgement: Decreased awareness of deficits, Decreased  awareness of safety General Comments: pt unaware of left lean in sitting or standing  Blood pressure (!) 119/108, pulse 99, temperature 99 F (37.2 C), temperature source Oral, resp. rate 18, height 5\' 11"  (1.803 m), weight 128.2 kg (282 lb 10.1 oz), SpO2 93 %. Physical Exam  Nursing note and vitals reviewed. Constitutional: He is oriented to person, place, and time. He appears well-developed and well-nourished.  HENT:  Head: Normocephalic and atraumatic.  Mouth/Throat: Oropharynx is clear and moist.  Eyes: Conjunctivae and EOM are normal. Pupils are equal, round, and reactive to light.  Neck: Normal range of motion. Neck supple.  Cardiovascular: Normal rate and regular rhythm.  Respiratory: Effort normal and breath sounds normal. No stridor. No respiratory distress. He has no wheezes.  GI: Soft. Bowel sounds are normal.  Musculoskeletal: He exhibits no edema or tenderness.  Left upper extremity 3- out of 5 proximal to distal.  Left lower extremity grossly 3 out of 5 proximal to distal.  Decreased sense of light touch and pain in left upper extremity.  Left lower extremity less effective from a sensory standpoint.  Toes are up and deep tendon reflexes are hyperactive in left arm and leg.  Patient with left central 7 and mild tongue deviation.  Visual fields are intact.  Cognitively displays reasonable insight and awareness.  Neurological: He is alert and oriented to person, place, and time. A cranial nerve deficit is present. Coordination abnormal.  Skin: Skin is warm and dry.  Psychiatric: He has a normal mood and affect. His behavior is normal. Thought content normal.    Results for orders placed or performed during the hospital encounter of 02/12/17 (from the past 24 hour(s))  MRSA PCR Screening  Status: None   Collection Time: 02/12/17  6:58 PM  Result Value Ref Range   MRSA by PCR NEGATIVE NEGATIVE  Hemoglobin A1c     Status: None   Collection Time: 02/13/17  7:53 AM  Result  Value Ref Range   Hgb A1c MFr Bld 5.5 4.8 - 5.6 %   Mean Plasma Glucose 111.15 mg/dL  Lipid panel     Status: Abnormal   Collection Time: 02/13/17  7:53 AM  Result Value Ref Range   Cholesterol 185 0 - 200 mg/dL   Triglycerides 98 <161 mg/dL   HDL 28 (L) >09 mg/dL   Total CHOL/HDL Ratio 6.6 RATIO   VLDL 20 0 - 40 mg/dL   LDL Cholesterol 604 (H) 0 - 99 mg/dL  CBC     Status: None   Collection Time: 02/13/17  7:53 AM  Result Value Ref Range   WBC 8.1 4.0 - 10.5 K/uL   RBC 4.83 4.22 - 5.81 MIL/uL   Hemoglobin 14.8 13.0 - 17.0 g/dL   HCT 54.0 98.1 - 19.1 %   MCV 89.6 78.0 - 100.0 fL   MCH 30.6 26.0 - 34.0 pg   MCHC 34.2 30.0 - 36.0 g/dL   RDW 47.8 29.5 - 62.1 %   Platelets 248 150 - 400 K/uL  Basic metabolic panel     Status: Abnormal   Collection Time: 02/13/17  7:53 AM  Result Value Ref Range   Sodium 136 135 - 145 mmol/L   Potassium 3.7 3.5 - 5.1 mmol/L   Chloride 106 101 - 111 mmol/L   CO2 22 22 - 32 mmol/L   Glucose, Bld 110 (H) 65 - 99 mg/dL   BUN 11 6 - 20 mg/dL   Creatinine, Ser 3.08 0.61 - 1.24 mg/dL   Calcium 8.8 (L) 8.9 - 10.3 mg/dL   GFR calc non Af Amer >60 >60 mL/min   GFR calc Af Amer >60 >60 mL/min   Anion gap 8 5 - 15  HIV antibody     Status: None   Collection Time: 02/13/17  7:53 AM  Result Value Ref Range   HIV Screen 4th Generation wRfx Non Reactive Non Reactive   Ct Angio Head W Or Wo Contrast  Result Date: 02/12/2017 CLINICAL DATA:  Acute presentation with left-sided numbness an weakness. EXAM: CT ANGIOGRAPHY HEAD AND NECK TECHNIQUE: Multidetector CT imaging of the head and neck was performed using the standard protocol during bolus administration of intravenous contrast. Multiplanar CT image reconstructions and MIPs were obtained to evaluate the vascular anatomy. Carotid stenosis measurements (when applicable) are obtained utilizing NASCET criteria, using the distal internal carotid diameter as the denominator. CONTRAST:  50mL ISOVUE-370 IOPAMIDOL  (ISOVUE-370) INJECTION 76% COMPARISON:  CT earlier same day FINDINGS: CTA NECK FINDINGS Aortic arch: The aorta is normal without atherosclerotic change, aneurysm or dissection. Branching pattern of the brachiocephalic vessels from the arch is normal. Right carotid system: Common carotid artery widely patent to the bifurcation. Carotid bifurcation is normal without soft or calcified plaque. Cervical ICA is tortuous but normal. Left carotid system: Common carotid artery widely patent to the bifurcation region. Left carotid bifurcation is similarly normal without soft or calcified plaque. Cervical ICA is tortuous but widely patent. Vertebral arteries: Both vertebral artery origins are widely patent. The vertebral arteries are approximately equal in size and widely patent through the cervical region. Skeleton: Ordinary mild midcervical spondylosis. Other neck: No mass or lymphadenopathy. Upper chest: Few small areas of indistinct density in the  right upper lobe, measuring about 5-8 mm in size. Differential diagnosis is inflammatory foci versus masses. Complete chest CT recommended at some point for complete evaluation. Review of the MIP images confirms the above findings CTA HEAD FINDINGS Anterior circulation: Both internal carotid arteries are widely patent through the skullbase and siphon regions. The anterior and middle cerebral vessels are patent without proximal stenosis, aneurysm or vascular malformation. No embolic disease identified. Posterior circulation: Both vertebral arteries are patent to the basilar. No basilar stenosis. Posterior circulation branch vessels appear patent. Venous sinuses: Patent and normal. Anatomic variants: None significant Delayed phase: Delayed phase not acquired. Review of the MIP images confirms the above findings IMPRESSION: Negative for large or medium vessel occlusion. No sign of atherosclerotic vascular disease, dissection or other focal vascular finding. Incidental detection of  indistinct nodular shadows in the right upper lobe. These could represent inflammatory foci or nodules. Complete chest CT recommended at some point. These results were communicated by text at the time of interpretation on 02/12/2017 at 10:43 am to Dr. Arther Dames. Electronically Signed   By: Paulina Fusi M.D.   On: 02/12/2017 10:51   Ct Head Wo Contrast  Result Date: 02/13/2017 CLINICAL DATA:  Stroke patient post tPA therapy. EXAM: CT HEAD WITHOUT CONTRAST TECHNIQUE: Contiguous axial images were obtained from the base of the skull through the vertex without intravenous contrast. COMPARISON:  Brain MRI 02/12/2017, head CT 02/12/2017 FINDINGS: Brain: No evidence of intracranial hemorrhage. Small linear areas of acute/ subacute infarction in the right insula and right parietal lobe demonstrated by MRI are not seen by CT. The ventricular system is stable. Vascular: No hyperdense vessel or unexpected calcification. Skull: Normal. Negative for fracture or focal lesion. Sinuses/Orbits: No acute finding. Other: None. IMPRESSION: No evidence of acute intracranial hemorrhage. Small linear areas of acute/ subacute infarction in the right insula and right parietal lobe are not demonstrated by CT. Electronically Signed   By: Ted Mcalpine M.D.   On: 02/13/2017 10:34   Ct Angio Neck W Or Wo Contrast  Result Date: 02/12/2017 CLINICAL DATA:  Acute presentation with left-sided numbness an weakness. EXAM: CT ANGIOGRAPHY HEAD AND NECK TECHNIQUE: Multidetector CT imaging of the head and neck was performed using the standard protocol during bolus administration of intravenous contrast. Multiplanar CT image reconstructions and MIPs were obtained to evaluate the vascular anatomy. Carotid stenosis measurements (when applicable) are obtained utilizing NASCET criteria, using the distal internal carotid diameter as the denominator. CONTRAST:  50mL ISOVUE-370 IOPAMIDOL (ISOVUE-370) INJECTION 76% COMPARISON:  CT earlier same day  FINDINGS: CTA NECK FINDINGS Aortic arch: The aorta is normal without atherosclerotic change, aneurysm or dissection. Branching pattern of the brachiocephalic vessels from the arch is normal. Right carotid system: Common carotid artery widely patent to the bifurcation. Carotid bifurcation is normal without soft or calcified plaque. Cervical ICA is tortuous but normal. Left carotid system: Common carotid artery widely patent to the bifurcation region. Left carotid bifurcation is similarly normal without soft or calcified plaque. Cervical ICA is tortuous but widely patent. Vertebral arteries: Both vertebral artery origins are widely patent. The vertebral arteries are approximately equal in size and widely patent through the cervical region. Skeleton: Ordinary mild midcervical spondylosis. Other neck: No mass or lymphadenopathy. Upper chest: Few small areas of indistinct density in the right upper lobe, measuring about 5-8 mm in size. Differential diagnosis is inflammatory foci versus masses. Complete chest CT recommended at some point for complete evaluation. Review of the MIP images confirms the above  findings CTA HEAD FINDINGS Anterior circulation: Both internal carotid arteries are widely patent through the skullbase and siphon regions. The anterior and middle cerebral vessels are patent without proximal stenosis, aneurysm or vascular malformation. No embolic disease identified. Posterior circulation: Both vertebral arteries are patent to the basilar. No basilar stenosis. Posterior circulation branch vessels appear patent. Venous sinuses: Patent and normal. Anatomic variants: None significant Delayed phase: Delayed phase not acquired. Review of the MIP images confirms the above findings IMPRESSION: Negative for large or medium vessel occlusion. No sign of atherosclerotic vascular disease, dissection or other focal vascular finding. Incidental detection of indistinct nodular shadows in the right upper lobe. These  could represent inflammatory foci or nodules. Complete chest CT recommended at some point. These results were communicated by text at the time of interpretation on 02/12/2017 at 10:43 am to Dr. Arther Dames. Electronically Signed   By: Paulina Fusi M.D.   On: 02/12/2017 10:51   Mr Brain Wo Contrast  Result Date: 02/12/2017 CLINICAL DATA:  52 y/o  M; left-sided weakness and numbness. EXAM: MRI HEAD WITHOUT CONTRAST TECHNIQUE: Multiplanar, multiecho pulse sequences of the brain and surrounding structures were obtained without intravenous contrast. COMPARISON:  02/13/2016 CT of the head. FINDINGS: Brain: Small area of reduced diffusion within right posterior insula and right lateral parietal compatible with acute/early subacute infarction. No associated hemorrhage or mass effect. Punctate focus of diffusion hyperintensity with intermediate diffusion and increased T2 FLAIR signal and right superior cerebellar hemisphere, likely subacute infarct. No focal mass effect, extra-axial collection, hydrocephalus, or effacement of basilar cisterns. Vascular: Normal flow voids. Skull and upper cervical spine: Normal marrow signal. Sinuses/Orbits: Negative. Other: None. IMPRESSION: 1. Small areas of acute/early subacute cortical infarction in right posterior insula and right lateral parietal lobe. No hemorrhage or mass effect. 2. Very small right superior cerebellar hemisphere subacute infarct. These results will be called to the ordering clinician or representative by the Radiologist Assistant, and communication documented in the PACS or zVision Dashboard. Electronically Signed   By: Mitzi Hansen M.D.   On: 02/12/2017 16:27   Ct Head Code Stroke Wo Contrast  Result Date: 02/12/2017 CLINICAL DATA:  Code stroke. Acute presentation with left-sided numbness in weakness. Last seen normal 90 minutes ago. EXAM: CT HEAD WITHOUT CONTRAST TECHNIQUE: Contiguous axial images were obtained from the base of the skull through  the vertex without intravenous contrast. COMPARISON:  None. FINDINGS: Brain: Normal appearance the supratentorial brain without evidence of old or acute infarction, mass lesion, hemorrhage, hydrocephalus or extra-axial collection. In the cerebellum, there are possible areas of low-density in the superior cerebellum on the right that could represent old or recent infarctions. No evidence of mass lesion, hemorrhage, hydrocephalus or extra-axial collection. Vascular:  No abnormal vascular findings. Skull: Normal Sinuses/Orbits: Clear/normal Other: None ASPECTS ([place] Stroke Program Early CT Score) - Ganglionic level infarction (caudate, lentiform nuclei, internal capsule, insula, M1-M3 cortex): 7 - Supraganglionic infarction (M4-M6 cortex): 3 Total score (0-10 with 10 being normal): 10 IMPRESSION: 1. No abnormal supratentorial finding. No cause of left-sided numbness and weakness is identified. 2. ASPECTS is 10. Question areas of low-density in the right cerebellum that could be recent or old infarctions. These results were communicated to at 10:29 amon 1/3/2019by text page via the [facility] messaging system. Electronically Signed   By: Paulina Fusi M.D.   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 Does the need for close, 24 hr/day medical supervision in  concert with the patient's rehab needs make it unreasonable for this patient to be served in a less intensive setting? Yes 2. Co-Morbidities requiring supervision/potential complications: Hypertension and post stroke sequelae 3. Due to bladder management, bowel management, safety, skin/wound care, disease management, medication administration, pain management and patient education, does the patient require 24 hr/day rehab nursing? Yes 4. Does the patient require coordinated care of a physician, rehab nurse, PT (1-2 hrs/day, 5 days/week), OT (1-2 hrs/day, 5 days/week) and SLP (1-2 hrs/day, 5  days/week) to address physical and functional deficits in the context of the above medical diagnosis(es)? Yes Addressing deficits in the following areas: balance, endurance, locomotion, strength, transferring, bowel/bladder control, bathing, dressing, feeding, grooming, toileting, cognition, speech, swallowing and psychosocial support 5. Can the patient actively participate in an intensive therapy program of at least 3 hrs of therapy per day at least 5 days per week? Yes 6. The potential for patient to make measurable gains while on inpatient rehab is excellent 7. Anticipated functional outcomes upon discharge from inpatient rehab are modified independent and supervision  with PT, modified independent and supervision with OT, modified independent with SLP. 8. Estimated rehab length of stay to reach the above functional goals is: 14-18 days 9. Anticipated D/C setting: Home 10. Anticipated post D/C treatments: HH therapy 11. Overall Rehab/Functional Prognosis: excellent  RECOMMENDATIONS: This patient's condition is appropriate for continued rehabilitative care in the following setting: CIR Patient has agreed to participate in recommended program. Yes Note that insurance prior authorization may be required for reimbursement for recommended care.  Comment: Rehab Admissions Coordinator to follow up.  Thanks,  Ranelle Oyster, MD, [place] [facility]    Jacquelynn Cree, PA-C 02/13/2017

## 2017-02-13 NOTE — Progress Notes (Signed)
STROKE TEAM PROGRESS NOTE   SUBJECTIVE (INTERVAL HISTORY) His wife is at the bedside.  Overall he feels his condition is gradually improving. Still has left facial droop and left UE mild weakness. MRI showed stroke more embolic pattern, will rule out DVT, PFO. Need TEE on Monday.    OBJECTIVE Temp:  [98.2 F (36.8 C)-99.5 F (37.5 C)] 99 F (37.2 C) (01/04 1200) Pulse Rate:  [90-109] 99 (01/04 1200) Cardiac Rhythm: Sinus tachycardia (01/04 1200) Resp:  [15-26] 18 (01/04 1200) BP: (119-183)/(64-158) 119/108 (01/04 1200) SpO2:  [90 %-98 %] 93 % (01/04 1200) Weight:  [282 lb 10.1 oz (128.2 kg)] 282 lb 10.1 oz (128.2 kg) (01/03 1845)  Recent Labs  Lab 02/12/17 1012  GLUCAP 116*   Recent Labs  Lab 02/12/17 1017 02/12/17 1019 02/13/17 0753  NA 141 139 136  K 3.8 3.8 3.7  CL 107 110 106  CO2  --  20* 22  GLUCOSE 117* 115* 110*  BUN 21* 19 11  CREATININE 1.10 1.07 0.99  CALCIUM  --  9.4 8.8*   Recent Labs  Lab 02/12/17 1019  AST 27  ALT 34  ALKPHOS 85  BILITOT 1.3*  PROT 7.2  ALBUMIN 3.9   Recent Labs  Lab 02/12/17 1017 02/12/17 1019 02/13/17 0753  WBC  --  8.2 8.1  NEUTROABS  --  4.7  --   HGB 16.0 16.4 14.8  HCT 47.0 46.6 43.3  MCV  --  89.6 89.6  PLT  --  245 248   No results for input(s): CKTOTAL, CKMB, CKMBINDEX, TROPONINI in the last 168 hours. Recent Labs    02/12/17 1019  LABPROT 13.2  INR 1.01   Recent Labs    02/12/17 1250  COLORURINE YELLOW  LABSPEC 1.034*  PHURINE 5.0  GLUCOSEU NEGATIVE  HGBUR MODERATE*  BILIRUBINUR NEGATIVE  KETONESUR NEGATIVE  PROTEINUR NEGATIVE  NITRITE NEGATIVE  LEUKOCYTESUR NEGATIVE       Component Value Date/Time   CHOL 185 02/13/2017 0753   CHOL 205 (H) 11/26/2016 1040   TRIG 98 02/13/2017 0753   HDL 28 (L) 02/13/2017 0753   HDL 34 (L) 11/26/2016 1040   CHOLHDL 6.6 02/13/2017 0753   VLDL 20 02/13/2017 0753   LDLCALC 137 (H) 02/13/2017 0753   LDLCALC 143 (H) 11/26/2016 1040   Lab Results  Component  Value Date   HGBA1C 5.5 02/13/2017      Component Value Date/Time   LABOPIA NONE DETECTED 02/12/2017 1250   COCAINSCRNUR NONE DETECTED 02/12/2017 1250   LABBENZ NONE DETECTED 02/12/2017 1250   AMPHETMU NONE DETECTED 02/12/2017 1250   THCU NONE DETECTED 02/12/2017 1250   LABBARB NONE DETECTED 02/12/2017 1250    Recent Labs  Lab 02/12/17 1019  ETH <10    I have personally reviewed the radiological images below and agree with the radiology interpretations.  Ct Angio Head W Or Wo Contrast  Result Date: 02/12/2017 CLINICAL DATA:  Acute presentation with left-sided numbness an weakness. EXAM: CT ANGIOGRAPHY HEAD AND NECK TECHNIQUE: Multidetector CT imaging of the head and neck was performed using the standard protocol during bolus administration of intravenous contrast. Multiplanar CT image reconstructions and MIPs were obtained to evaluate the vascular anatomy. Carotid stenosis measurements (when applicable) are obtained utilizing NASCET criteria, using the distal internal carotid diameter as the denominator. CONTRAST:  50mL ISOVUE-370 IOPAMIDOL (ISOVUE-370) INJECTION 76% COMPARISON:  CT earlier same day FINDINGS: CTA NECK FINDINGS Aortic arch: The aorta is normal without atherosclerotic change, aneurysm or dissection.  Branching pattern of the brachiocephalic vessels from the arch is normal. Right carotid system: Common carotid artery widely patent to the bifurcation. Carotid bifurcation is normal without soft or calcified plaque. Cervical ICA is tortuous but normal. Left carotid system: Common carotid artery widely patent to the bifurcation region. Left carotid bifurcation is similarly normal without soft or calcified plaque. Cervical ICA is tortuous but widely patent. Vertebral arteries: Both vertebral artery origins are widely patent. The vertebral arteries are approximately equal in size and widely patent through the cervical region. Skeleton: Ordinary mild midcervical spondylosis. Other neck: No  mass or lymphadenopathy. Upper chest: Few small areas of indistinct density in the right upper lobe, measuring about 5-8 mm in size. Differential diagnosis is inflammatory foci versus masses. Complete chest CT recommended at some point for complete evaluation. Review of the MIP images confirms the above findings CTA HEAD FINDINGS Anterior circulation: Both internal carotid arteries are widely patent through the skullbase and siphon regions. The anterior and middle cerebral vessels are patent without proximal stenosis, aneurysm or vascular malformation. No embolic disease identified. Posterior circulation: Both vertebral arteries are patent to the basilar. No basilar stenosis. Posterior circulation branch vessels appear patent. Venous sinuses: Patent and normal. Anatomic variants: None significant Delayed phase: Delayed phase not acquired. Review of the MIP images confirms the above findings IMPRESSION: Negative for large or medium vessel occlusion. No sign of atherosclerotic vascular disease, dissection or other focal vascular finding. Incidental detection of indistinct nodular shadows in the right upper lobe. These could represent inflammatory foci or nodules. Complete chest CT recommended at some point. These results were communicated by text at the time of interpretation on 02/12/2017 at 10:43 am to Dr. Arther Dames. Electronically Signed   By: Paulina Fusi M.D.   On: 02/12/2017 10:51   Ct Head Wo Contrast  Result Date: 02/13/2017 CLINICAL DATA:  Stroke patient post tPA therapy. EXAM: CT HEAD WITHOUT CONTRAST TECHNIQUE: Contiguous axial images were obtained from the base of the skull through the vertex without intravenous contrast. COMPARISON:  Brain MRI 02/12/2017, head CT 02/12/2017 FINDINGS: Brain: No evidence of intracranial hemorrhage. Small linear areas of acute/ subacute infarction in the right insula and right parietal lobe demonstrated by MRI are not seen by CT. The ventricular system is stable.  Vascular: No hyperdense vessel or unexpected calcification. Skull: Normal. Negative for fracture or focal lesion. Sinuses/Orbits: No acute finding. Other: None. IMPRESSION: No evidence of acute intracranial hemorrhage. Small linear areas of acute/ subacute infarction in the right insula and right parietal lobe are not demonstrated by CT. Electronically Signed   By: Ted Mcalpine M.D.   On: 02/13/2017 10:34   Ct Angio Neck W Or Wo Contrast  Result Date: 02/12/2017 CLINICAL DATA:  Acute presentation with left-sided numbness an weakness. EXAM: CT ANGIOGRAPHY HEAD AND NECK TECHNIQUE: Multidetector CT imaging of the head and neck was performed using the standard protocol during bolus administration of intravenous contrast. Multiplanar CT image reconstructions and MIPs were obtained to evaluate the vascular anatomy. Carotid stenosis measurements (when applicable) are obtained utilizing NASCET criteria, using the distal internal carotid diameter as the denominator. CONTRAST:  50mL ISOVUE-370 IOPAMIDOL (ISOVUE-370) INJECTION 76% COMPARISON:  CT earlier same day FINDINGS: CTA NECK FINDINGS Aortic arch: The aorta is normal without atherosclerotic change, aneurysm or dissection. Branching pattern of the brachiocephalic vessels from the arch is normal. Right carotid system: Common carotid artery widely patent to the bifurcation. Carotid bifurcation is normal without soft or calcified plaque. Cervical ICA is  tortuous but normal. Left carotid system: Common carotid artery widely patent to the bifurcation region. Left carotid bifurcation is similarly normal without soft or calcified plaque. Cervical ICA is tortuous but widely patent. Vertebral arteries: Both vertebral artery origins are widely patent. The vertebral arteries are approximately equal in size and widely patent through the cervical region. Skeleton: Ordinary mild midcervical spondylosis. Other neck: No mass or lymphadenopathy. Upper chest: Few small areas of  indistinct density in the right upper lobe, measuring about 5-8 mm in size. Differential diagnosis is inflammatory foci versus masses. Complete chest CT recommended at some point for complete evaluation. Review of the MIP images confirms the above findings CTA HEAD FINDINGS Anterior circulation: Both internal carotid arteries are widely patent through the skullbase and siphon regions. The anterior and middle cerebral vessels are patent without proximal stenosis, aneurysm or vascular malformation. No embolic disease identified. Posterior circulation: Both vertebral arteries are patent to the basilar. No basilar stenosis. Posterior circulation branch vessels appear patent. Venous sinuses: Patent and normal. Anatomic variants: None significant Delayed phase: Delayed phase not acquired. Review of the MIP images confirms the above findings IMPRESSION: Negative for large or medium vessel occlusion. No sign of atherosclerotic vascular disease, dissection or other focal vascular finding. Incidental detection of indistinct nodular shadows in the right upper lobe. These could represent inflammatory foci or nodules. Complete chest CT recommended at some point. These results were communicated by text at the time of interpretation on 02/12/2017 at 10:43 am to Dr. Arther DamesSUSHANTH AROOR. Electronically Signed   By: Paulina FusiMark  Shogry M.D.   On: 02/12/2017 10:51   Mr Brain Wo Contrast  Result Date: 02/12/2017 CLINICAL DATA:  52 y/o  M; left-sided weakness and numbness. EXAM: MRI HEAD WITHOUT CONTRAST TECHNIQUE: Multiplanar, multiecho pulse sequences of the brain and surrounding structures were obtained without intravenous contrast. COMPARISON:  02/13/2016 CT of the head. FINDINGS: Brain: Small area of reduced diffusion within right posterior insula and right lateral parietal compatible with acute/early subacute infarction. No associated hemorrhage or mass effect. Punctate focus of diffusion hyperintensity with intermediate diffusion and  increased T2 FLAIR signal and right superior cerebellar hemisphere, likely subacute infarct. No focal mass effect, extra-axial collection, hydrocephalus, or effacement of basilar cisterns. Vascular: Normal flow voids. Skull and upper cervical spine: Normal marrow signal. Sinuses/Orbits: Negative. Other: None. IMPRESSION: 1. Small areas of acute/early subacute cortical infarction in right posterior insula and right lateral parietal lobe. No hemorrhage or mass effect. 2. Very small right superior cerebellar hemisphere subacute infarct. These results will be called to the ordering clinician or representative by the Radiologist Assistant, and communication documented in the PACS or zVision Dashboard. Electronically Signed   By: Mitzi HansenLance  Furusawa-Stratton M.D.   On: 02/12/2017 16:27   Ct Head Code Stroke Wo Contrast  Result Date: 02/12/2017 CLINICAL DATA:  Code stroke. Acute presentation with left-sided numbness in weakness. Last seen normal 90 minutes ago. EXAM: CT HEAD WITHOUT CONTRAST TECHNIQUE: Contiguous axial images were obtained from the base of the skull through the vertex without intravenous contrast. COMPARISON:  None. FINDINGS: Brain: Normal appearance the supratentorial brain without evidence of old or acute infarction, mass lesion, hemorrhage, hydrocephalus or extra-axial collection. In the cerebellum, there are possible areas of low-density in the superior cerebellum on the right that could represent old or recent infarctions. No evidence of mass lesion, hemorrhage, hydrocephalus or extra-axial collection. Vascular:  No abnormal vascular findings. Skull: Normal Sinuses/Orbits: Clear/normal Other: None ASPECTS ([place]berta Stroke Program Early CT Score) - Ganglionic level  infarction (caudate, lentiform nuclei, internal capsule, insula, M1-M3 cortex): 7 - Supraganglionic infarction (M4-M6 cortex): 3 Total score (0-10 with 10 being normal): 10 IMPRESSION: 1. No abnormal supratentorial finding. No cause of  left-sided numbness and weakness is identified. 2. ASPECTS is 10. Question areas of low-density in the right cerebellum that could be recent or old infarctions. These results were communicated to at 10:29 amon 1/3/2019by text page via the [facility] messaging system. Electronically Signed   By: Paulina Fusi M.D.   On: 02/12/2017 10:31   LE venous doppler pending  TCD bubble study pending  TTE pending   PHYSICAL EXAM  Temp:  [98.2 F (36.8 C)-99.5 F (37.5 C)] 99 F (37.2 C) (01/04 1200) Pulse Rate:  [90-109] 99 (01/04 1200) Resp:  [15-26] 18 (01/04 1200) BP: (119-183)/(64-158) 119/108 (01/04 1200) SpO2:  [90 %-98 %] 93 % (01/04 1200) Weight:  [282 lb 10.1 oz (128.2 kg)] 282 lb 10.1 oz (128.2 kg) (01/03 1845)  General - Well nourished, well developed, in no apparent distress.  Ophthalmologic - fundi not visualized due to noncooperation.  Cardiovascular - Regular rate and rhythm with no murmur.  Mental Status -  Level of arousal and orientation to time, place, and person were intact. Language including expression, naming, repetition, comprehension was assessed and found intact. Fund of Knowledge was assessed and was intact.  Cranial Nerves II - XII - II - Visual field intact OU. Li, IV, VI - Extraocular movements intact. V - Facial sensation intact bilaterally. VII - left facial droop. VIII - Hearing & vestibular intact bilaterally. X - Palate elevates symmetrically, mild dysarthria. XI - Chin turning & shoulder shrug intact bilaterally. XII - Tongue protrusion to the left  Motor Strength - The patient's strength was 5/5 RUE and RLE, 4/5 LUE and 5-/5 LLE. No pronator drift seen.  Bulk was normal and fasciculations were absent.   Motor Tone - Muscle tone was assessed at the neck and appendages and was normal.  Reflexes - The patient's reflexes were symmetrical in all extremities and he had no pathological reflexes.  Sensory - Light touch, temperature/pinprick were assessed and  were symmetrical.    Coordination - The patient had mild dysmetria on left FTN proportional to the weakness.  Tremor was absent.  Gait and Station - deferred.   ASSESSMENT/PLAN [name] is a 52 y.o. male with history of HTN, obesity admitted for left sided weakness, numbness, slurry speech. 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 Yes; Fluid consistency: Thin   No antithrombotic prior to admission, now on aspirin 325 mg daily.   Patient counseled to be compliant with his antithrombotic medications  Ongoing aggressive stroke risk factor management  Therapy recommendations:  pending  Disposition:  pending  Hypertension Stable Permissive hypertension (OK if <180/105) for 24-48 hours post stroke and then gradually normalized within 5-7 days.  Long term BP  goal normotensive  Hyperlipidemia  Home meds:  none   LDL 137, goal < 70  Now on lipitor 40  Continue statin at discharge  Other Stroke Risk Factors  ETOH use, educated to limit to less than 2 drinks per day  Obesity, Body mass index is 39.42 kg/m.   Other Active Problems  none  Hospital day # 1  This patient is critically ill due to embolic stroke s/p tPA, HTN, HLD and at significant risk of neurological worsening, death form recurrent stroke, hypertensive emergency, hemorrhagic conversion,  seizure. This patient's care requires constant monitoring of vital signs, hemodynamics, respiratory and cardiac monitoring, review of multiple databases, neurological assessment, discussion with family, other specialists and medical decision making of high complexity. I had long discussion with pt and wife at bedside, updated pt current condition, treatment plan and potential prognosis. They expressed understanding and appreciation. I spent 40 minutes of neurocritical care time in the care of this patient.   Marvel Plan, MD PhD Stroke Neurology 02/13/2017 12:45 PM    To contact Stroke Continuity provider, please refer to [email or web address]. After hours, contact General Neurology

## 2017-02-14 LAB — CBC
HCT: 43.4 % (ref 39.0–52.0)
Hemoglobin: 14.7 g/dL (ref 13.0–17.0)
MCH: 30.9 pg (ref 26.0–34.0)
MCHC: 33.9 g/dL (ref 30.0–36.0)
MCV: 91.4 fL (ref 78.0–100.0)
Platelets: 248 10*3/uL (ref 150–400)
RBC: 4.75 MIL/uL (ref 4.22–5.81)
RDW: 13 % (ref 11.5–15.5)
WBC: 6.5 10*3/uL (ref 4.0–10.5)

## 2017-02-14 LAB — CARDIOLIPIN ANTIBODIES, IGG, IGM, IGA
Anticardiolipin IgA: 16 APL U/mL — ABNORMAL HIGH (ref 0–11)
Anticardiolipin IgG: 90 GPL U/mL — ABNORMAL HIGH (ref 0–14)
Anticardiolipin IgM: <9 MPL U/mL (ref 0–12)

## 2017-02-14 LAB — BASIC METABOLIC PANEL
Anion gap: 10 (ref 5–15)
BUN: 12 mg/dL (ref 6–20)
CO2: 21 mmol/L — ABNORMAL LOW (ref 22–32)
Calcium: 9.6 mg/dL (ref 8.9–10.3)
Chloride: 106 mmol/L (ref 101–111)
Creatinine, Ser: 0.97 mg/dL (ref 0.61–1.24)
GFR calc Af Amer: >60 mL/min (ref >60)
GFR calc non Af Amer: >60 mL/min (ref >60)
Glucose, Bld: 91 mg/dL (ref 65–99)
Potassium: 3.6 mmol/L (ref 3.5–5.1)
Sodium: 137 mmol/L (ref 135–145)

## 2017-02-14 LAB — BETA-2-GLYCOPROTEIN I ABS, IGG/M/A
Beta-2 Glyco I IgG: 111 GPI IgG units — ABNORMAL HIGH (ref 0–20)
Beta-2-Glycoprotein I IgA: 14 GPI IgA units (ref 0–25)
Beta-2-Glycoprotein I IgM: <9 GPI IgM units (ref 0–32)

## 2017-02-14 LAB — ANTI-DNA ANTIBODY, DOUBLE-STRANDED: ds DNA Ab: 30 IU/mL — ABNORMAL HIGH (ref 0–9)

## 2017-02-14 MED ORDER — DOCUSATE SODIUM 100 MG PO CAPS
200.0000 mg | ORAL_CAPSULE | Freq: Every day | ORAL | Status: DC
  Administered 2017-02-14 – 2017-02-16 (×3): 200 mg via ORAL
  Filled 2017-02-14 (×3): qty 2

## 2017-02-14 MED ORDER — DOCUSATE SODIUM 50 MG/5ML PO LIQD: 200.0000 mg | Freq: Two times a day (BID) | ORAL | Status: DC

## 2017-02-14 NOTE — Progress Notes (Signed)
STROKE TEAM PROGRESS NOTE    HISTORY PER H&P - 02/12/2017 Sharlett IlesChester Caravello III is an 52 y.o. male with PMH of HTN, obesity presents as stroke alert for sudden onset weakness and numbness in left side.  Patient states he was last known normal at 9 am and was working from home when he noticed his left arm and leg became numb and weak and called 911. EMS noted significant weakness on left side and unable to hold arm, unable to walk. BP was 160 systolic, FSG was normal. Patients symptoms improved on [facility], however continued to have mild weakness, loss of sensation, dysarthria on arrival. Unable to walk without support on assessment. Ct head was negative and he received tPA after 10mg  labetolol for blood pressure. No recent GI bleed/surgery.  CTA head and neck was negative for LVO. Takes amlodipine for HTN.   Date last known well: 02/12/17 Time last known well: 9 am tPA Given: yes NIHSS: 5 Baseline MRS 0    SUBJECTIVE (INTERVAL HISTORY) His wife is not at bedside, no events overnight  OBJECTIVE Temp:  [98 F (36.7 C)-99.6 F (37.6 C)] 98 F (36.7 C) (01/05 1100) Pulse Rate:  [77-99] 99 (01/05 1100) Cardiac Rhythm: Normal sinus rhythm (01/05 0701) Resp:  [12-28] 16 (01/05 1100) BP: (115-145)/(61-108) 137/83 (01/05 1100) SpO2:  [91 %-97 %] 96 % (01/05 1100)  Recent Labs  Lab 02/12/17 1012  GLUCAP 116*   Recent Labs  Lab 02/12/17 1017 02/12/17 1019 02/13/17 0753 02/14/17 0354  NA 141 139 136 137  K 3.8 3.8 3.7 3.6  CL 107 110 106 106  CO2  --  20* 22 21*  GLUCOSE 117* 115* 110* 91  BUN 21* 19 11 12   CREATININE 1.10 1.07 0.99 0.97  CALCIUM  --  9.4 8.8* 9.6   Recent Labs  Lab 02/12/17 1019  AST 27  ALT 34  ALKPHOS 85  BILITOT 1.3*  PROT 7.2  ALBUMIN 3.9   Recent Labs  Lab 02/12/17 1017 02/12/17 1019 02/13/17 0753 02/14/17 0354  WBC  --  8.2 8.1 6.5  NEUTROABS  --  4.7  --   --   HGB 16.0 16.4 14.8 14.7  HCT 47.0 46.6 43.3 43.4  MCV  --  89.6 89.6  91.4  PLT  --  245 248 248   No results for input(s): CKTOTAL, CKMB, CKMBINDEX, TROPONINI in the last 168 hours. Recent Labs    02/12/17 1019  LABPROT 13.2  INR 1.01   Recent Labs    02/12/17 1250  COLORURINE YELLOW  LABSPEC 1.034*  PHURINE 5.0  GLUCOSEU NEGATIVE  HGBUR MODERATE*  BILIRUBINUR NEGATIVE  KETONESUR NEGATIVE  PROTEINUR NEGATIVE  NITRITE NEGATIVE  LEUKOCYTESUR NEGATIVE       Component Value Date/Time   CHOL 185 02/13/2017 0753   CHOL 205 (H) 11/26/2016 1040   TRIG 98 02/13/2017 0753   HDL 28 (L) 02/13/2017 0753   HDL 34 (L) 11/26/2016 1040   CHOLHDL 6.6 02/13/2017 0753   VLDL 20 02/13/2017 0753   LDLCALC 137 (H) 02/13/2017 0753   LDLCALC 143 (H) 11/26/2016 1040   Lab Results  Component Value Date   HGBA1C 5.5 02/13/2017      Component Value Date/Time   LABOPIA NONE DETECTED 02/12/2017 1250   COCAINSCRNUR NONE DETECTED 02/12/2017 1250   LABBENZ NONE DETECTED 02/12/2017 1250   AMPHETMU NONE DETECTED 02/12/2017 1250   THCU NONE DETECTED 02/12/2017 1250   LABBARB NONE DETECTED 02/12/2017 1250  Recent Labs  Lab 02/12/17 1019  ETH <10     IMAGING  I have personally reviewed the radiological images below and agree with the radiology interpretations.   Ct Angio Head W Or Wo Contrast Ct Angio Neck W Or Wo Contrast 02/12/2017 IMPRESSION:  Negative for large or medium vessel occlusion. No sign of atherosclerotic vascular disease, dissection or other focal vascular finding.  Incidental detection of indistinct nodular shadows in the right upper lobe. These could represent inflammatory foci or nodules. Complete chest CT recommended at some point.    Ct Head Wo Contrast 02/13/2017 IMPRESSION:  No evidence of acute intracranial hemorrhage.  Small linear areas of acute/ subacute infarction in the right insula and right parietal lobe are not demonstrated by CT.    Mr Brain Wo Contrast 02/12/2017 IMPRESSION:  1. Small areas of acute/early subacute  cortical infarction in right posterior insula and right lateral parietal lobe. No hemorrhage or mass effect.  2. Very small right superior cerebellar hemisphere subacute infarct.    Ct Head Code Stroke Wo Contrast 02/12/2017  IMPRESSION:  1. No abnormal supratentorial finding. No cause of left-sided numbness and weakness is identified.  2. ASPECTS is 10. Question areas of low-density in the right cerebellum that could be recent or old infarctions.    LE venous doppler pending   TCD bubble study  02/13/2017 FINAL INTERPRETATION A vascular evaluation was performed. The left middle cerebral artery was studied. An IV was inserted into the patients' left Forearm using aseptic precautions. Verbal informed consent was obtained. There is no evidence of high intensity transient signals (HITS) at rest or with valsalva. Therefore, there is no obvious evidence of patent foramen ovale (PFO). *See table(s) above for measurements and observations.  TTE  02/13/2017 Study Conclusions - Left ventricle: The cavity size was normal. There was mild   concentric hypertrophy. Systolic function was vigorous. The   estimated ejection fraction was in the range of 65% to 70%. Wall   motion was normal; there were no regional wall motion   abnormalities. Doppler parameters are consistent with abnormal   left ventricular relaxation (grade 1 diastolic dysfunction). - Aortic valve: There was mild regurgitation. - Aorta: Aortic root dimension: 39 mm (ED). - Ascending aorta: The ascending aorta was mildly dilated. Impressions: - No cardiac source of emboli was indentified.   PHYSICAL EXAM  Temp:  [98 F (36.7 C)-99.6 F (37.6 C)] 98 F (36.7 C) (01/05 1100) Pulse Rate:  [77-99] 99 (01/05 1100) Resp:  [12-28] 16 (01/05 1100) BP: (115-145)/(61-108) 137/83 (01/05 1100) SpO2:  [91 %-97 %] 96 % (01/05 1100)  General - Well nourished, well developed, in no apparent distress.  Ophthalmologic - fundi not  visualized due to noncooperation.  Cardiovascular - Regular rate and rhythm with no murmur.  Mental Status -  Level of arousal and orientation to time, place, and person were intact. Language including expression, naming, repetition, comprehension was assessed and found intact. Fund of Knowledge was assessed and was intact.  Cranial Nerves II - XII - II - Visual field intact OU. III, IV, VI - Extraocular movements intact. V - Facial sensation intact bilaterally. VII - left facial droop. VIII - Hearing & vestibular intact bilaterally. X - Palate elevates symmetrically, mild dysarthria. XI - Chin turning & shoulder shrug intact bilaterally. XII - Tongue protrusion to the left  Motor Strength - The patient's strength was 5/5 RUE and RLE, 4/5 LUE and 5-/5 LLE. No pronator drift seen.  Bulk was normal  and fasciculations were absent.   Motor Tone - Muscle tone was assessed at the neck and appendages and was normal.  Reflexes - The patient's reflexes were symmetrical in all extremities and he had no pathological reflexes.  Sensory - Light touch, temperature/pinprick were assessed and were symmetrical.    Coordination - The patient had mild dysmetria on left FTN proportional to the weakness.  Tremor was absent.  Gait and Station - deferred.   ASSESSMENT/PLAN Mr. Luisalberto Beegle III is a 52 y.o. male with history of HTN, obesity admitted for left sided weakness, numbness, slurry speech. 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  TCD bubble study - no obvious evidence of  patent foramen ovale (PFO)  LE venous doppler pending  Recommend TEE and loop to rule out cardioembolic source - cardiology aware.  Hypercoagulable work up pending  LDL 137  HgbA1c 5.5  lovenox for VTE prophylaxis  Diet Heart Room service appropriate? Yes; Fluid consistency: Thin   No antithrombotic prior to admission, now on aspirin 325 mg daily.   Patient counseled to be compliant with his antithrombotic medications  Ongoing aggressive stroke risk factor management  Therapy recommendations: CIR recommended - Dr Riley Kill has seen patient.  Disposition:  pending  Hypertension Stable Permissive hypertension (OK if <180/105) for 24-48 hours post stroke and then gradually normalized within 5-7 days.  Long term BP goal normotensive  Hyperlipidemia  Home meds:  none   LDL 137, goal < 70  Now on lipitor 40  Continue statin at discharge  Other Stroke Risk Factors  ETOH use, educated to limit to less than 2 drinks per day  Obesity, Body mass index is 39.42 kg/m.   Other Active Problems  The patient needs a chest CT to evaluate right upper lobe pulmonary nodules.   Assessment Plan  TEE and possible loop Monday - cardiology notified - NPO ordered  Hospital day # 2  This patient is critically ill due to embolic stroke s/p tPA, HTN, HLD and at significant risk of neurological worsening, death form recurrent stroke, hypertensive emergency, hemorrhagic conversion, seizure. This patient's care requires constant monitoring of vital signs, hemodynamics, respiratory and cardiac monitoring, review of multiple databases, neurological assessment, discussion with family, other specialists and medical decision making of high complexity. I had long discussion with pt and wife at bedside, updated pt current condition, treatment plan and potential prognosis. They expressed understanding and appreciation. I spent 40 minutes of neurocritical care time in the care of this  patient.      To contact Stroke Continuity provider, please refer to [email or web address]. After hours, contact General Neurology

## 2017-02-14 NOTE — Evaluation (Signed)
Speech Language Pathology Evaluation Patient Details Name: [name] Corsino III MRN: [id number] DOB: 10-Mar-1965 Today's Date: 02/14/2017 Time: [id number] SLP Time Calculation (min) (ACUTE ONLY): 24 min  Problem List:  Patient Active Problem List   Diagnosis Date Noted  . Stroke (HCC) 02/12/2017  . Allergic rhinitis 11/07/2016  . BMI 39.0-39.9,adult 11/07/2016  . Benign essential HTN 10/18/2016   Past Medical History:  Past Medical History:  Diagnosis Date  . Fall from roof    with kidney trauma  . Hypertension 2018   Past Surgical History:  Past Surgical History:  Procedure Laterality Date  . NO PAST SURGERIES     HPI:  [name] Montel IIIis an 52 y.o.malewith PMH of HTN, obesity presents as stroke alert for sudden onset weakness and numbness in left side.  Ct head was negative and he received tPA after 10mg  labetolol for blood pressure. MRI shows Small areas of acute/early subacute cortical infarction in right posterior insula and right lateral parietal lobe.    Assessment / Plan / Recommendation Clinical Impression  Pt demonstrates mild cognitive impairment in higher level processing, awareness of errors, mild impulsivity, storage of new information higher level reasoning. Scored 19/30 on Montreal Cognitive Assessment with 26 or above WNL. Mild dysarthria also present. Recomment pt f/u with SLP acutely for compensatory strategies and CIR after d/c. Discussed findings with pt and wife.     SLP Assessment  SLP Recommendation/Assessment: Patient needs continued Speech Lanaguage Pathology Services SLP Visit Diagnosis: Dysarthria and anarthria (R47.1);Frontal lobe and executive function deficit Frontal lobe and executive function deficit following: Cerebral infarction    Follow Up Recommendations  Inpatient Rehab    Frequency and Duration min 2x/week  2 weeks      SLP Evaluation Cognition  Overall Cognitive Status: Impaired/Different from baseline Arousal/Alertness:  Awake/alert Orientation Level: Oriented X4 Attention: Focused;Sustained;Selective;Alternating Focused Attention: Appears intact Sustained Attention: Appears intact Selective Attention: Appears intact Alternating Attention: Appears intact Memory: Impaired Memory Impairment: Storage deficit Problem Solving: Appears intact Executive Function: Reasoning;Self Monitoring Reasoning: Impaired Reasoning Impairment: Verbal complex;Functional complex Self Monitoring: Impaired Self Monitoring Impairment: Verbal complex;Functional complex Safety/Judgment: Impaired       Comprehension  Auditory Comprehension Overall Auditory Comprehension: Appears within functional limits for tasks assessed    Expression Expression Primary Mode of Expression: Verbal Verbal Expression Overall Verbal Expression: Appears within functional limits for tasks assessed Written Expression Dominant Hand: Right   Oral / Motor  Oral Motor/Sensory Function Overall Oral Motor/Sensory Function: Mild impairment Facial ROM: Reduced left;Suspected CN VII (facial) dysfunction Facial Symmetry: Abnormal symmetry left;Suspected CN VII (facial) dysfunction Facial Strength: Reduced left;Suspected CN VII (facial) dysfunction Lingual ROM: Reduced left;Suspected CN XII (hypoglossal) dysfunction Lingual Symmetry: Abnormal symmetry left;Suspected CN XII (hypoglossal) dysfunction Lingual Strength: Suspected CN XII (hypoglossal) dysfunction Lingual Sensation: Suspected CN VII (facial) dysfunction-anterior 2/3 tongue;Reduced Velum: Within Functional Limits Mandible: Within Functional Limits Motor Speech Overall Motor Speech: Impaired Respiration: Within functional limits Phonation: Normal Resonance: Within functional limits Articulation: Impaired Level of Impairment: Word Intelligibility: Intelligible Motor Planning: Witnin functional limits Motor Speech Errors: Aware;Consistent   GO                    Dade Rodin, Riley NearingBonnie  Caroline 02/14/2017, 1:23 PM

## 2017-02-15 ENCOUNTER — Inpatient Hospital Stay (HOSPITAL_COMMUNITY): Payer: BLUE CROSS/BLUE SHIELD

## 2017-02-15 DIAGNOSIS — I639 Cerebral infarction, unspecified: Secondary | ICD-10-CM

## 2017-02-15 LAB — CBC
HCT: 44.7 % (ref 39.0–52.0)
Hemoglobin: 15.3 g/dL (ref 13.0–17.0)
MCH: 31 pg (ref 26.0–34.0)
MCHC: 34.2 g/dL (ref 30.0–36.0)
MCV: 90.5 fL (ref 78.0–100.0)
Platelets: 267 10*3/uL (ref 150–400)
RBC: 4.94 MIL/uL (ref 4.22–5.81)
RDW: 12.7 % (ref 11.5–15.5)
WBC: 9.8 10*3/uL (ref 4.0–10.5)

## 2017-02-15 LAB — BASIC METABOLIC PANEL
Anion gap: 7 (ref 5–15)
BUN: 15 mg/dL (ref 6–20)
CO2: 23 mmol/L (ref 22–32)
Calcium: 9.3 mg/dL (ref 8.9–10.3)
Chloride: 107 mmol/L (ref 101–111)
Creatinine, Ser: 1.16 mg/dL (ref 0.61–1.24)
GFR calc Af Amer: >60 mL/min (ref >60)
GFR calc non Af Amer: >60 mL/min (ref >60)
Glucose, Bld: 96 mg/dL (ref 65–99)
Potassium: 3.8 mmol/L (ref 3.5–5.1)
Sodium: 137 mmol/L (ref 135–145)

## 2017-02-15 MED ORDER — SODIUM CHLORIDE 0.9 % IV SOLN: INTRAVENOUS | Status: DC

## 2017-02-15 NOTE — Progress Notes (Signed)
Bilateral lower extremity venous duplex has been completed. Negative for DVT.  02/15/17 3:03 PM Greg Tyresse Jayson RVT  

## 2017-02-15 NOTE — Progress Notes (Signed)
STROKE TEAM PROGRESS NOTE    HISTORY PER H&P - 02/12/2017 [name] is an 52 y.o. male with PMH of HTN, obesity presents as stroke alert for sudden onset weakness and numbness in left side.  Patient states he was last known normal at 9 am and was working from home when he noticed his left arm and leg became numb and weak and called 911. EMS noted significant weakness on left side and unable to hold arm, unable to walk. BP was 160 systolic, FSG was normal. Patients symptoms improved on transport to [facility], however continued to have mild weakness, loss of sensation, dysarthria on arrival. Unable to walk without support on assessment. Ct head was negative and he received tPA after 10mg  labetolol for blood pressure. No recent GI bleed/surgery.  CTA head and neck was negative for LVO. Takes amlodipine for HTN.   Date last known well: 02/12/17 Time last known well: 9 am tPA Given: yes NIHSS: 5 Baseline MRS 0    SUBJECTIVE (INTERVAL HISTORY) His wife is not at bedside, no events overnight, he is holding for TEE and loop, constipation improved.  OBJECTIVE Temp:  [97.6 F (36.4 C)-99.1 F (37.3 C)] 97.6 F (36.4 C) (01/06 0519) Pulse Rate:  [80-99] 92 (01/06 0519) Cardiac Rhythm: Normal sinus rhythm (01/06 0701) Resp:  [16-18] 18 (01/06 0519) BP: (130-148)/(68-83) 130/82 (01/06 0519) SpO2:  [93 %-98 %] 95 % (01/06 0519)  Recent Labs  Lab 02/12/17 1012  GLUCAP 116*   Recent Labs  Lab 02/12/17 1017  02/12/17 1019 02/13/17 0753 02/14/17 0354 02/15/17 0328  NA 141  --  139 136 137 137  K 3.8  --  3.8 3.7 3.6 3.8  CL 107  --  110 106 106 107  CO2  --   --  20* 22 21* 23  GLUCOSE 117*  --  115* 110* 91 96  BUN 21*  --  19 11 12 15   CREATININE 1.10  --  1.07 0.99 0.97 1.16  CALCIUM  --    < > 9.4 8.8* 9.6 9.3   < > = values in this interval not displayed.   Recent Labs  Lab 02/12/17 1019  AST 27  ALT 34  ALKPHOS 85  BILITOT 1.3*  PROT 7.2  ALBUMIN 3.9   Recent Labs   Lab 02/12/17 1017 02/12/17 1019 02/13/17 0753 02/14/17 0354 02/15/17 0328  WBC  --  8.2 8.1 6.5 9.8  NEUTROABS  --  4.7  --   --   --   HGB 16.0 16.4 14.8 14.7 15.3  HCT 47.0 46.6 43.3 43.4 44.7  MCV  --  89.6 89.6 91.4 90.5  PLT  --  245 248 248 267   No results for input(s): CKTOTAL, CKMB, CKMBINDEX, TROPONINI in the last 168 hours. Recent Labs    02/12/17 1019  LABPROT 13.2  INR 1.01   Recent Labs    02/12/17 1250  COLORURINE YELLOW  LABSPEC 1.034*  PHURINE 5.0  GLUCOSEU NEGATIVE  HGBUR MODERATE*  BILIRUBINUR NEGATIVE  KETONESUR NEGATIVE  PROTEINUR NEGATIVE  NITRITE NEGATIVE  LEUKOCYTESUR NEGATIVE       Component Value Date/Time   CHOL 185 02/13/2017 0753   CHOL 205 (H) 11/26/2016 1040   TRIG 98 02/13/2017 0753   HDL 28 (L) 02/13/2017 0753   HDL 34 (L) 11/26/2016 1040   CHOLHDL 6.6 02/13/2017 0753   VLDL 20 02/13/2017 0753   LDLCALC 137 (H) 02/13/2017 0753   LDLCALC 143 (H)  11/26/2016 1040   Lab Results  Component Value Date   HGBA1C 5.5 02/13/2017      Component Value Date/Time   LABOPIA NONE DETECTED 02/12/2017 1250   COCAINSCRNUR NONE DETECTED 02/12/2017 1250   LABBENZ NONE DETECTED 02/12/2017 1250   AMPHETMU NONE DETECTED 02/12/2017 1250   THCU NONE DETECTED 02/12/2017 1250   LABBARB NONE DETECTED 02/12/2017 1250    Recent Labs  Lab 02/12/17 1019  ETH <10     IMAGING  I have personally reviewed the radiological images below and agree with the radiology interpretations.   Ct Angio Head W Or Wo Contrast Ct Angio Neck W Or Wo Contrast 02/12/2017 IMPRESSION:  Negative for large or medium vessel occlusion. No sign of atherosclerotic vascular disease, dissection or other focal vascular finding.  Incidental detection of indistinct nodular shadows in the right upper lobe. These could represent inflammatory foci or nodules. Complete chest CT recommended at some point.    Ct Head Wo Contrast 02/13/2017 IMPRESSION:  No evidence of acute  intracranial hemorrhage.  Small linear areas of acute/ subacute infarction in the right insula and right parietal lobe are not demonstrated by CT.    Mr Brain Wo Contrast 02/12/2017 IMPRESSION:  1. Small areas of acute/early subacute cortical infarction in right posterior insula and right lateral parietal lobe. No hemorrhage or mass effect.  2. Very small right superior cerebellar hemisphere subacute infarct.    Ct Head Code Stroke Wo Contrast 02/12/2017  IMPRESSION:  1. No abnormal supratentorial finding. No cause of left-sided numbness and weakness is identified.  2. ASPECTS is 10. Question areas of low-density in the right cerebellum that could be recent or old infarctions.    LE venous doppler pending   TCD bubble study  02/13/2017 FINAL INTERPRETATION A vascular evaluation was performed. The left middle cerebral artery was studied. An IV was inserted into the patients' left Forearm using aseptic precautions. Verbal informed consent was obtained. There is no evidence of high intensity transient signals (HITS) at rest or with valsalva. Therefore, there is no obvious evidence of patent foramen ovale (PFO). *See table(s) above for measurements and observations.  TTE  02/13/2017 Study Conclusions - Left ventricle: The cavity size was normal. There was mild   concentric hypertrophy. Systolic function was vigorous. The   estimated ejection fraction was in the range of 65% to 70%. Wall   motion was normal; there were no regional wall motion   abnormalities. Doppler parameters are consistent with abnormal   left ventricular relaxation (grade 1 diastolic dysfunction). - Aortic valve: There was mild regurgitation. - Aorta: Aortic root dimension: 39 mm (ED). - Ascending aorta: The ascending aorta was mildly dilated. Impressions: - No cardiac source of emboli was indentified.   PHYSICAL EXAM  Temp:  [97.6 F (36.4 C)-99.1 F (37.3 C)] 97.6 F (36.4 C) (01/06 0519) Pulse Rate:   [80-99] 92 (01/06 0519) Resp:  [16-18] 18 (01/06 0519) BP: (130-148)/(68-83) 130/82 (01/06 0519) SpO2:  [93 %-98 %] 95 % (01/06 0519)  General - Well nourished, well developed, in no apparent distress.  Ophthalmologic - fundi not visualized due to noncooperation.  Cardiovascular - Regular rate and rhythm with no murmur.  Mental Status -  Level of arousal and orientation to time, place, and person were intact. Language including expression, naming, repetition, comprehension was assessed and found intact. Fund of Knowledge was assessed and was intact.  Cranial Nerves II - XII - II - Visual field intact OU. Li, IV, VI - Extraocular  movements intact. V - Facial sensation intact bilaterally. VII - left facial droop. VIII - Hearing & vestibular intact bilaterally. X - Palate elevates symmetrically, mild dysarthria. XI - Chin turning & shoulder shrug intact bilaterally. XII - Tongue protrusion to the left  Motor Strength - The patient's strength was 5/5 RUE and RLE, 4/5 LUE and 5-/5 LLE. No pronator drift seen.  Bulk was normal and fasciculations were absent.   Motor Tone - Muscle tone was assessed at the neck and appendages and was normal.  Reflexes - The patient's reflexes were symmetrical in all extremities and he had no pathological reflexes.  Sensory - Light touch, temperature/pinprick were assessed and were symmetrical.    Coordination - The patient had mild dysmetria on left FTN proportional to the weakness.  Tremor was absent.  Gait and Station - deferred.   ASSESSMENT/PLAN Mr. Jaelon Gatley Li is a 52 y.o. male with history of HTN, obesity admitted for left sided weakness, numbness, slurry speech. 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  TCD bubble study - no obvious evidence of patent foramen ovale (PFO)  LE venous doppler pending  Recommend TEE and loop to rule out cardioembolic source - cardiology aware.  Hypercoagulable work up pending  LDL 137  HgbA1c 5.5  lovenox for VTE prophylaxis Diet Heart Room service appropriate? Yes; Fluid consistency: Thin  Diet NPO time specified Except for: Sips with Meds   No antithrombotic prior to admission, now on aspirin 325 mg daily.   Patient counseled to be compliant with his antithrombotic medications  Ongoing aggressive stroke risk factor management  Therapy recommendations: CIR recommended - Dr Riley Kill has seen patient.  Disposition:  pending  Hypertension Stable Permissive hypertension (OK if <180/105) for 24-48 hours post stroke and then gradually normalized within 5-7 days.  Long term BP goal normotensive  Hyperlipidemia  Home meds:  none   LDL 137, goal < 70  Now on lipitor 40  Continue statin at discharge  Other Stroke Risk Factors  ETOH use, educated to limit to less than 2 drinks per day  Obesity, Body mass index is 39.42 kg/m.   Other Active Problems  The patient needs a chest CT to evaluate right upper lobe pulmonary nodules.   Assessment Plan  TEE and possible loop Monday - cardiology notified - NPO ordered  Hospital day # 3   Personally  participated in, made any corrections needed, and agree with history, physical, neuro exam,assessment and plan as stated above.    Naomie Dean, MD Stroke Neurology    To contact Stroke Continuity provider, please refer to [email or web address]. After hours, contact General Neurology

## 2017-02-15 NOTE — H&P (View-Only) (Signed)
STROKE TEAM PROGRESS NOTE    HISTORY PER H&P - 02/12/2017 [name] is an 52 y.o. male with PMH of HTN, obesity presents as stroke alert for sudden onset weakness and numbness in left side.  Patient states he was last known normal at 9 am and was working from home when he noticed his left arm and leg became numb and weak and called 911. EMS noted significant weakness on left side and unable to hold arm, unable to walk. BP was 160 systolic, FSG was normal. Patients symptoms improved on transport to [facility] ER, however continued to have mild weakness, loss of sensation, dysarthria on arrival. Unable to walk without support on assessment. Ct head was negative and he received tPA after 10mg labetolol for blood pressure. No recent GI bleed/surgery.  CTA head and neck was negative for LVO. Takes amlodipine for HTN.   Date last known well: 02/12/17 Time last known well: 9 am tPA Given: yes NIHSS: 5 Baseline MRS 0    SUBJECTIVE (INTERVAL HISTORY) His wife is not at bedside, no events overnight, he is holding for TEE and loop, constipation improved.  OBJECTIVE Temp:  [97.6 F (36.4 C)-99.1 F (37.3 C)] 97.6 F (36.4 C) (01/06 0519) Pulse Rate:  [80-99] 92 (01/06 0519) Cardiac Rhythm: Normal sinus rhythm (01/06 0701) Resp:  [16-18] 18 (01/06 0519) BP: (130-148)/(68-83) 130/82 (01/06 0519) SpO2:  [93 %-98 %] 95 % (01/06 0519)  Recent Labs  Lab 02/12/17 1012  GLUCAP 116*   Recent Labs  Lab 02/12/17 1017  02/12/17 1019 02/13/17 0753 02/14/17 0354 02/15/17 0328  NA 141  --  139 136 137 137  K 3.8  --  3.8 3.7 3.6 3.8  CL 107  --  110 106 106 107  CO2  --   --  20* 22 21* 23  GLUCOSE 117*  --  115* 110* 91 96  BUN 21*  --  19 11 12 15  CREATININE 1.10  --  1.07 0.99 0.97 1.16  CALCIUM  --    < > 9.4 8.8* 9.6 9.3   < > = values in this interval not displayed.   Recent Labs  Lab 02/12/17 1019  AST 27  ALT 34  ALKPHOS 85  BILITOT 1.3*  PROT 7.2  ALBUMIN 3.9   Recent Labs   Lab 02/12/17 1017 02/12/17 1019 02/13/17 0753 02/14/17 0354 02/15/17 0328  WBC  --  8.2 8.1 6.5 9.8  NEUTROABS  --  4.7  --   --   --   HGB 16.0 16.4 14.8 14.7 15.3  HCT 47.0 46.6 43.3 43.4 44.7  MCV  --  89.6 89.6 91.4 90.5  PLT  --  245 248 248 267   No results for input(s): CKTOTAL, CKMB, CKMBINDEX, TROPONINI in the last 168 hours. Recent Labs    02/12/17 1019  LABPROT 13.2  INR 1.01   Recent Labs    02/12/17 1250  COLORURINE YELLOW  LABSPEC 1.034*  PHURINE 5.0  GLUCOSEU NEGATIVE  HGBUR MODERATE*  BILIRUBINUR NEGATIVE  KETONESUR NEGATIVE  PROTEINUR NEGATIVE  NITRITE NEGATIVE  LEUKOCYTESUR NEGATIVE       Component Value Date/Time   CHOL 185 02/13/2017 0753   CHOL 205 (H) 11/26/2016 1040   TRIG 98 02/13/2017 0753   HDL 28 (L) 02/13/2017 0753   HDL 34 (L) 11/26/2016 1040   CHOLHDL 6.6 02/13/2017 0753   VLDL 20 02/13/2017 0753   LDLCALC 137 (H) 02/13/2017 0753   LDLCALC 143 (H)   11/26/2016 1040   Lab Results  Component Value Date   HGBA1C 5.5 02/13/2017      Component Value Date/Time   LABOPIA NONE DETECTED 02/12/2017 1250   COCAINSCRNUR NONE DETECTED 02/12/2017 1250   LABBENZ NONE DETECTED 02/12/2017 1250   AMPHETMU NONE DETECTED 02/12/2017 1250   THCU NONE DETECTED 02/12/2017 1250   LABBARB NONE DETECTED 02/12/2017 1250    Recent Labs  Lab 02/12/17 1019  ETH <10     IMAGING  I have personally reviewed the radiological images below and agree with the radiology interpretations.   Ct Angio Head W Or Wo Contrast Ct Angio Neck W Or Wo Contrast 02/12/2017 IMPRESSION:  Negative for large or medium vessel occlusion. No sign of atherosclerotic vascular disease, dissection or other focal vascular finding.  Incidental detection of indistinct nodular shadows in the right upper lobe. These could represent inflammatory foci or nodules. Complete chest CT recommended at some point.    Ct Head Wo Contrast 02/13/2017 IMPRESSION:  No evidence of acute  intracranial hemorrhage.  Small linear areas of acute/ subacute infarction in the right insula and right parietal lobe are not demonstrated by CT.    Mr Brain Wo Contrast 02/12/2017 IMPRESSION:  1. Small areas of acute/early subacute cortical infarction in right posterior insula and right lateral parietal lobe. No hemorrhage or mass effect.  2. Very small right superior cerebellar hemisphere subacute infarct.    Ct Head Code Stroke Wo Contrast 02/12/2017  IMPRESSION:  1. No abnormal supratentorial finding. No cause of left-sided numbness and weakness is identified.  2. ASPECTS is 10. Question areas of low-density in the right cerebellum that could be recent or old infarctions.    LE venous doppler pending   TCD bubble study  02/13/2017 FINAL INTERPRETATION A vascular evaluation was performed. The left middle cerebral artery was studied. An IV was inserted into the patients' left Forearm using aseptic precautions. Verbal informed consent was obtained. There is no evidence of high intensity transient signals (HITS) at rest or with valsalva. Therefore, there is no obvious evidence of patent foramen ovale (PFO). *See table(s) above for measurements and observations.  TTE  02/13/2017 Study Conclusions - Left ventricle: The cavity size was normal. There was mild   concentric hypertrophy. Systolic function was vigorous. The   estimated ejection fraction was in the range of 65% to 70%. Wall   motion was normal; there were no regional wall motion   abnormalities. Doppler parameters are consistent with abnormal   left ventricular relaxation (grade 1 diastolic dysfunction). - Aortic valve: There was mild regurgitation. - Aorta: Aortic root dimension: 39 mm (ED). - Ascending aorta: The ascending aorta was mildly dilated. Impressions: - No cardiac source of emboli was indentified.   PHYSICAL EXAM  Temp:  [97.6 F (36.4 C)-99.1 F (37.3 C)] 97.6 F (36.4 C) (01/06 0519) Pulse Rate:   [80-99] 92 (01/06 0519) Resp:  [16-18] 18 (01/06 0519) BP: (130-148)/(68-83) 130/82 (01/06 0519) SpO2:  [93 %-98 %] 95 % (01/06 0519)  General - Well nourished, well developed, in no apparent distress.  Ophthalmologic - fundi not visualized due to noncooperation.  Cardiovascular - Regular rate and rhythm with no murmur.  Mental Status -  Level of arousal and orientation to time, place, and person were intact. Language including expression, naming, repetition, comprehension was assessed and found intact. Fund of Knowledge was assessed and was intact.  Cranial Nerves II - XII - II - Visual field intact OU. Li, IV, VI - Extraocular   movements intact. V - Facial sensation intact bilaterally. VII - left facial droop. VIII - Hearing & vestibular intact bilaterally. X - Palate elevates symmetrically, mild dysarthria. XI - Chin turning & shoulder shrug intact bilaterally. XII - Tongue protrusion to the left  Motor Strength - The patient's strength was 5/5 RUE and RLE, 4/5 LUE and 5-/5 LLE. No pronator drift seen.  Bulk was normal and fasciculations were absent.   Motor Tone - Muscle tone was assessed at the neck and appendages and was normal.  Reflexes - The patient's reflexes were symmetrical in all extremities and he had no pathological reflexes.  Sensory - Light touch, temperature/pinprick were assessed and were symmetrical.    Coordination - The patient had mild dysmetria on left FTN proportional to the weakness.  Tremor was absent.  Gait and Station - deferred.   ASSESSMENT/PLAN [name] is a 51 y.o. male with history of HTN, obesity admitted for left sided weakness, numbness, slurry speech. 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  TCD bubble study - no obvious evidence of patent foramen ovale (PFO)  LE venous doppler pending  Recommend TEE and loop to rule out cardioembolic source - cardiology aware.  Hypercoagulable work up pending  LDL 137  HgbA1c 5.5  lovenox for VTE prophylaxis Diet Heart Room service appropriate? Yes; Fluid consistency: Thin  Diet NPO time specified Except for: Sips with Meds   No antithrombotic prior to admission, now on aspirin 325 mg daily.   Patient counseled to be compliant with his antithrombotic medications  Ongoing aggressive stroke risk factor management  Therapy recommendations: CIR recommended - Dr Swartz has seen patient.  Disposition:  pending  Hypertension Stable Permissive hypertension (OK if <180/105) for 24-48 hours post stroke and then gradually normalized within 5-7 days.  Long term BP goal normotensive  Hyperlipidemia  Home meds:  none   LDL 137, goal < 70  Now on lipitor 40  Continue statin at discharge  Other Stroke Risk Factors  ETOH use, educated to limit to less than 2 drinks per day  Obesity, Body mass index is 39.42 kg/m.   Other Active Problems  The patient needs a chest CT to evaluate right upper lobe pulmonary nodules.   Assessment Plan  TEE and possible loop Monday - cardiology notified - NPO ordered  Hospital day # 3   Personally  participated in, made any corrections needed, and agree with history, physical, neuro exam,assessment and plan as stated above.    Amee Boothe, MD Stroke Neurology    To contact Stroke Continuity provider, please refer to [email or web address]. After hours, contact General Neurology 

## 2017-02-16 ENCOUNTER — Encounter (HOSPITAL_COMMUNITY)
Admission: EM | Disposition: A | Discharge status: Rehabilitation Facility | Payer: Self-pay | Source: Home / Self Care | Attending: Neurology

## 2017-02-16 ENCOUNTER — Inpatient Hospital Stay (HOSPITAL_COMMUNITY): Payer: BLUE CROSS/BLUE SHIELD

## 2017-02-16 ENCOUNTER — Encounter (HOSPITAL_COMMUNITY): Payer: Self-pay | Admitting: *Deleted

## 2017-02-16 DIAGNOSIS — I6389 Other cerebral infarction: Secondary | ICD-10-CM

## 2017-02-16 DIAGNOSIS — I351 Nonrheumatic aortic (valve) insufficiency: Secondary | ICD-10-CM

## 2017-02-16 DIAGNOSIS — I639 Cerebral infarction, unspecified: Secondary | ICD-10-CM

## 2017-02-16 HISTORY — PX: TEE WITHOUT CARDIOVERSION: SHX5443

## 2017-02-16 HISTORY — PX: LOOP RECORDER INSERTION: EP1214

## 2017-02-16 LAB — LUPUS ANTICOAGULANT PANEL
DRVVT: 77.1 s — ABNORMAL HIGH (ref 0.0–47.0)
PTT Lupus Anticoagulant: 43 s (ref 0.0–51.9)

## 2017-02-16 LAB — DRVVT CONFIRM: dRVVT Confirm: 2.1 ratio — ABNORMAL HIGH (ref 0.8–1.2)

## 2017-02-16 LAB — FANA STAINING PATTERNS: Speckled Pattern: 1:80 {titer}

## 2017-02-16 LAB — HOMOCYSTEINE: Homocysteine: 12.6 umol/L (ref 0.0–15.0)

## 2017-02-16 LAB — ANTINUCLEAR ANTIBODIES, IFA: ANA Ab, IFA: POSITIVE — AB

## 2017-02-16 LAB — DRVVT MIX: dRVVT Mix: 47.4 s — ABNORMAL HIGH (ref 0.0–47.0)

## 2017-02-16 SURGERY — ECHOCARDIOGRAM, TRANSESOPHAGEAL
Anesthesia: Moderate Sedation

## 2017-02-16 SURGERY — LOOP RECORDER INSERTION

## 2017-02-16 MED ORDER — FENTANYL CITRATE (PF) 100 MCG/2ML IJ SOLN
INTRAMUSCULAR | Status: DC | PRN
  Administered 2017-02-16 (×2): 25 ug via INTRAVENOUS

## 2017-02-16 MED ORDER — MIDAZOLAM HCL 5 MG/ML IJ SOLN
INTRAMUSCULAR | Status: AC
  Filled 2017-02-16: qty 2

## 2017-02-16 MED ORDER — MIDAZOLAM HCL 10 MG/2ML IJ SOLN
INTRAMUSCULAR | Status: DC | PRN
  Administered 2017-02-16 (×2): 2 mg via INTRAVENOUS

## 2017-02-16 MED ORDER — FENTANYL CITRATE (PF) 100 MCG/2ML IJ SOLN
INTRAMUSCULAR | Status: AC
  Filled 2017-02-16: qty 2

## 2017-02-16 MED ORDER — LIDOCAINE-EPINEPHRINE 1 %-1:100000 IJ SOLN
INTRAMUSCULAR | Status: DC | PRN
  Administered 2017-02-16: 20 mL

## 2017-02-16 MED ORDER — LIDOCAINE-EPINEPHRINE 1 %-1:100000 IJ SOLN
INTRAMUSCULAR | Status: AC
  Filled 2017-02-16: qty 1

## 2017-02-16 SURGICAL SUPPLY — 2 items
LOOP REVEAL LINQSYS (Prosthesis & Implant Heart) ×2 IMPLANT
PACK LOOP INSERTION (CUSTOM PROCEDURE TRAY) ×2 IMPLANT

## 2017-02-16 NOTE — PMR Pre-admission (Signed)
PMR Admission Coordinator Pre-Admission Assessment  Patient: [name] is an 52 y.o., male MRN: [id number] DOB: [birth year] Height: 5\' 11"  (180.3 cm) Weight: 128.2 kg (282 lb 10.1 oz)              Insurance Information HMO:     PPO:      PCP:      IPA:      80/20:      OTHER:  PRIMARY: [employer] [place] [employer] [place]      Policy#: [id number]      Subscriber: pt CM Name: [name]     Phone#: [phone]     Fax#: [phone] approved until 1/14 when updates are due Pre-Cert#: [id number]      Employer:  Benefits:  Phone #: [phone]     Name: 1/7 Eff. Date: 02/10/17     Deduct: $3000      Out of Pocket Max: $3000 does not include deductible      Life Max: none CIR: 80%      SNF: 80% 120 days Outpatient: 80%     Co-Pay: 90 days combined Home Health: 80%      Co-Pay: 90 visits DME: 80%     Co-Pay: 20% Providers: in network  SECONDARY: none        Medicaid Application Date:       Case Manager:  Disability Application Date:       Case Worker:   Emergency Conservator, museum/gallery Information    Name Relation Home Work Mobile   [name] Spouse [phone]    [name] Mother [phone]       Current Medical History  Patient Admitting Diagnosis: right parietal and cerebellar infarcts  History of Present Illness:   [name] is a 52 y.o. male with history of HTN who was admitted on 02/12/17 with left facial and left sided weakness with numbness. MRI brain done showing "small areas of acute/early subacute cortical infarction in right posterior insula and right lateral parietal lobe as well as very small right superior cerebellar subacute infarct". UDS negative and work up underway.  Dr. Roda Shutters recommended work up for embolic source and TEE  And LOOP completed 02/16/17..    Total: 4 NIHSS    Past Medical History  Past Medical History:  Diagnosis Date  . Fall from roof    with kidney trauma  . Hypertension 2018    Family History  family history includes  Hypertension in his father, mother, and sister; Leukemia in his paternal uncle; Stroke in his father.  Prior Rehab/Hospitalizations:  Has the patient had major surgery during 100 days prior to admission? No  Current Medications   Current Facility-Administered Medications:  .  acetaminophen (TYLENOL) tablet 650 mg, 650 mg, Oral, Q4H PRN **OR** [DISCONTINUED] acetaminophen (TYLENOL) solution 650 mg, 650 mg, Per Tube, Q4H PRN **OR** [DISCONTINUED] acetaminophen (TYLENOL) suppository 650 mg, 650 mg, Rectal, Q4H PRN, Aroor, Georgiana Spinner R, MD .  amLODipine (NORVASC) tablet 10 mg, 10 mg, Oral, Daily, Ulice Dash, PA-C, 10 mg at 02/17/17 0900 .  aspirin EC tablet 325 mg, 325 mg, Oral, Daily, Marvel Plan, MD, 325 mg at 02/17/17 0900 .  atorvastatin (LIPITOR) tablet 40 mg, 40 mg, Oral, q1800, Marvel Plan, MD, 40 mg at 02/16/17 1745 .  docusate sodium (COLACE) capsule 200 mg, 200 mg, Oral, QHS, Naomie Dean B, MD, 200 mg at 02/16/17 2252 .  enoxaparin (LOVENOX) injection 40 mg, 40 mg, Subcutaneous, Q24H, Marvel Plan, MD, 40 mg at 02/16/17 1330 .  labetalol (NORMODYNE,TRANDATE) injection 10-20 mg, 10-20 mg, Intravenous, Q10 min PRN, Marvel PlanXu, Jindong, MD .  lisinopril (PRINIVIL,ZESTRIL) tablet 20 mg, 20 mg, Oral, Daily, Marvel PlanXu, Jindong, MD, 20 mg at 02/17/17 0900 .  pantoprazole (PROTONIX) EC tablet 40 mg, 40 mg, Oral, Daily, Marvel PlanXu, Jindong, MD, 40 mg at 02/17/17 0900  Patients Current Diet: Diet Heart Room service appropriate? Yes; Fluid consistency: Thin  Precautions / Restrictions Precautions Precautions: Fall Restrictions Weight Bearing Restrictions: No   Has the patient had 2 or more falls or a fall with injury in the past year?No  Prior Activity Level Community (5-7x/wk): independent, working, and Museum/gallery exhibitions officerdriving  Home Assistive Devices / Equipment Home Assistive Devices/Equipment: None Home Equipment: None  Prior Device Use: Indicate devices/aids used by the patient prior to current illness, exacerbation  or injury? None of the above  Prior Functional Level Prior Function Level of Independence: Independent Comments: Works in Psychologist, clinicalsales for construction, works from home  Self Care: Did the patient need help bathing, dressing, using the toilet or eating?  Independent  Indoor Mobility: Did the patient need assistance with walking from room to room (with or without device)? Independent  Stairs: Did the patient need assistance with internal or external stairs (with or without device)? Independent  Functional Cognition: Did the patient need help planning regular tasks such as shopping or remembering to take medications? Independent  Current Functional Level Cognition  Arousal/Alertness: Awake/alert Overall Cognitive Status: Within Functional Limits for tasks assessed Orientation Level: Oriented X4 Safety/Judgement: Decreased awareness of deficits, Decreased awareness of safety General Comments: pt unaware of left lean in sitting or standing Attention: Focused, Sustained, Selective, Alternating Focused Attention: Appears intact Sustained Attention: Appears intact Selective Attention: Appears intact Alternating Attention: Appears intact Memory: Impaired Memory Impairment: Storage deficit Problem Solving: Appears intact Executive Function: Reasoning, Self Monitoring Reasoning: Impaired Reasoning Impairment: Verbal complex, Functional complex Self Monitoring: Impaired Self Monitoring Impairment: Verbal complex, Functional complex Safety/Judgment: Impaired    Extremity Assessment (includes Sensation/Coordination)  Upper Extremity Assessment: LUE deficits/detail LUE Deficits / Details: Grossly 3/5, poor coordination  LUE Sensation: decreased light touch LUE Coordination: decreased fine motor, decreased gross motor  Lower Extremity Assessment: Defer to PT evaluation LLE Sensation: decreased light touch    ADLs  Overall ADL's : Needs assistance/impaired Grooming: Minimal assistance,  Sitting Upper Body Bathing: Moderate assistance, Sitting Lower Body Bathing: Maximal assistance, Sit to/from stand Upper Body Dressing : Moderate assistance, Sitting Lower Body Dressing: Maximal assistance, Sit to/from stand Toilet Transfer: Moderate assistance, +2 for physical assistance, Ambulation Toilet Transfer Details (indicate cue type and reason): Simulated Toileting- Clothing Manipulation and Hygiene: Moderate assistance, Sit to/from stand Toileting - Clothing Manipulation Details (indicate cue type and reason): for peri care in standing Functional mobility during ADLs: Moderate assistance, +2 for physical assistance(2 person HHA)    Mobility  Overal bed mobility: Needs Assistance Bed Mobility: Supine to Sit Supine to sit: Min assist General bed mobility comments: pt with rail, HOB 20 degrees with increased time and cues for balance and safety as pt leaning left and needing assist to achieve full midline and cues to scoot to EOB    Transfers  Overall transfer level: Needs assistance Equipment used: 2 person hand held assist Transfers: Sit to/from Stand Sit to Stand: Min assist, +2 safety/equipment General transfer comment: cues for hand placement and safety with cues to achieve midline in standing due to maintain left lean in standing    Ambulation / Gait / Stairs / Wheelchair Mobility  Ambulation/Gait Ambulation/Gait assistance:  Mod assist, +2 physical assistance, +2 safety/equipment Ambulation Distance (Feet): 55 Feet Assistive device: 2 person hand held assist Gait Pattern/deviations: Step-to pattern, Decreased stride length, Narrow base of support, Drifts right/left General Gait Details: pt with maintained left lean with decreased use of LUE and mod assist for balance throughout +2 with cues for safety and midline throughout. Pt with short step length with decreased clearance and awareness of left foot placement with stepping and cues to sequence Gait velocity  interpretation: Below normal speed for age/gender    Posture / Balance Balance Overall balance assessment: Needs assistance Sitting-balance support: Feet supported Sitting balance-Leahy Scale: Fair Standing balance support: Bilateral upper extremity supported Standing balance-Leahy Scale: Poor Standing balance comment: mod +2 assist for balance    Special needs/care consideration BiPAP/CPAP  N/a CPM  N/a Continuous Drip IV  N/a Dialysis  N/a Life Vest  N/a Oxygen  N/a Special Bed  N/a Trach Size  N/a Wound Vac n/a Skin intact Bowel mgmt: continent; constipation, LBM 02/17/17 Bladder mgmt:  continent Diabetic mgmt  N/a   Previous Home Environment Living Arrangements: Spouse/significant other  Lives With: Spouse Available Help at Discharge: (wife would take fmla) Type of Home: House Home Layout: One level Home Access: Stairs to enter Secretary/administrator of Steps: 1 Bathroom Shower/Tub: Hydrographic surveyor, Sport and exercise psychologist: Standard Bathroom Accessibility: Yes How Accessible: Accessible via walker Home Care Services: No  Discharge Living Setting Plans for Discharge Living Setting: Patient's home, Lives with (comment)(wife) Type of Home at Discharge: House Discharge Home Layout: One level Discharge Home Access: Stairs to enter Entrance Stairs-Rails: None Entrance Stairs-Number of Steps: 1 Discharge Bathroom Shower/Tub: Tub/shower unit Discharge Bathroom Toilet: Standard Does the patient have any problems obtaining your medications?: No  Social/Family/Support Systems Patient Roles: Spouse(employee) Contact Information: Vickie, wife Anticipated Caregiver: wife Anticipated Industrial/product designer Information: see above Ability/Limitations of Caregiver: wife would take fmla if needed short term Caregiver Availability: Other (Comment) Discharge Plan Discussed with Primary Caregiver: Yes Is Caregiver In Agreement with Plan?: Yes Does Caregiver/Family have Issues with  Lodging/Transportation while Pt is in Rehab?: No  Goals/Additional Needs Patient/Family Goal for Rehab: Mod I to supervision with PT, OT, and SLP Expected length of stay: ELOS 14-18 days Pt/Family Agrees to Admission and willing to participate: Yes Program Orientation Provided & Reviewed with Pt/Caregiver Including Roles  & Responsibilities: Yes  Decrease burden of Care through IP rehab admission: n/a  Possible need for SNF placement upon discharge:not anticipated  Patient Condition: This patient's medical and functional status has changed since the consult dated: 02/13/2017 in which the Rehabilitation Physician determined and documented that the patient's condition is appropriate for intensive rehabilitative care in an inpatient rehabilitation facility. See "History of Present Illness" (above) for medical update. Functional changes are: mod assist. Patient's medical and functional status update has been discussed with the Rehabilitation physician and patient remains appropriate for inpatient rehabilitation. Will admit to inpatient rehab today.  Preadmission Screen Completed By:  Clois Dupes, 02/17/2017 11:22 AM ______________________________________________________________________   Discussed status with Dr. Riley Kill on 02/17/2017 at  1122 and received telephone approval for admission today.  Admission Coordinator:  Clois Dupes, time 1610 Date 02/17/2017

## 2017-02-16 NOTE — NC FL2 (Signed)
[place] MEDICAID FL2 LEVEL OF CARE SCREENING TOOL     IDENTIFICATION  Patient Name: Jesus IlesChester Larrabee III Birthdate: [birth year] Sex: male Admission Date (Current Location): 02/12/2017  [facility] [place]  [employer]   Facility and Address:  [facility] [place], [address], [place], [place] [address]      Provider Number: [id number]  Attending Physician Name and Address:  Micki RileySethi, Pramod S, MD  Relative Name and Phone Number:       Current Level of Care: Hospital Recommended Level of Care: Skilled Nursing Facility Prior Approval Number:    Date Approved/Denied:   PASRR Number: [id number]  Discharge Plan: SNF    Current Diagnoses: Patient Active Problem List   Diagnosis Date Noted  . Cryptogenic stroke (HCC) 02/12/2017  . Allergic rhinitis 11/07/2016  . BMI 39.0-39.9,adult 11/07/2016  . Benign essential HTN 10/18/2016    Orientation RESPIRATION BLADDER Height & Weight     Self, Time, Situation, Place  Normal, O2(O2 as needed; see DC summary) Continent Weight: 282 lb 10.1 oz (128.2 kg) Height:  5\' 11"  (180.3 cm)  BEHAVIORAL SYMPTOMS/MOOD NEUROLOGICAL BOWEL NUTRITION STATUS      Continent Diet(heart healthy)  AMBULATORY STATUS COMMUNICATION OF NEEDS Skin   Extensive Assist Verbally Normal                       Personal Care Assistance Level of Assistance  Bathing, Feeding, Dressing Bathing Assistance: Limited assistance Feeding assistance: Independent Dressing Assistance: Limited assistance     Functional Limitations Info  Sight, Hearing, Speech Sight Info: Adequate Hearing Info: Adequate Speech Info: Adequate    SPECIAL CARE FACTORS FREQUENCY  PT (By licensed PT), OT (By licensed OT)     PT Frequency: 5x/wk OT Frequency: 5x/wk            Contractures Contractures Info: Not present    Additional Factors Info  Code Status, Allergies Code Status Info: Full Allergies Info: Penicillins           Current  Medications (02/16/2017):  This is the current hospital active medication list Current Facility-Administered Medications  Medication Dose Route Frequency Provider Last Rate Last Dose  . acetaminophen (TYLENOL) tablet 650 mg  650 mg Oral Q4H PRN Aroor, Georgiana SpinnerSushanth R, MD      . amLODipine (NORVASC) tablet 10 mg  10 mg Oral Daily Ulice DashSmith, David R, PA-C   10 mg at 02/15/17 [id number]  . aspirin EC tablet 325 mg  325 mg Oral Daily Marvel PlanXu, Jindong, MD   325 mg at 02/15/17 0917  . atorvastatin (LIPITOR) tablet 40 mg  40 mg Oral q1800 Marvel PlanXu, Jindong, MD   40 mg at 02/14/17 1740  . docusate sodium (COLACE) capsule 200 mg  200 mg Oral QHS Naomie DeanAhern, Antonia B, MD   200 mg at 02/15/17 2216  . enoxaparin (LOVENOX) injection 40 mg  40 mg Subcutaneous Q24H Marvel PlanXu, Jindong, MD   40 mg at 02/15/17 1232  . labetalol (NORMODYNE,TRANDATE) injection 10-20 mg  10-20 mg Intravenous Q10 min PRN Marvel PlanXu, Jindong, MD      . lisinopril (PRINIVIL,ZESTRIL) tablet 20 mg  20 mg Oral Daily Marvel PlanXu, Jindong, MD   20 mg at 02/15/17 0917  . pantoprazole (PROTONIX) EC tablet 40 mg  40 mg Oral Daily Marvel PlanXu, Jindong, MD   40 mg at 02/15/17 [id number]     Discharge Medications: Please see discharge summary for a list of discharge medications.  Relevant Imaging Results:  Relevant Lab Results:  Additional Information SS#: [id number]  Baldemar [place], [place]  Delia Heady, MD Medical Director [facility] Pager: [phone] 02/17/2017 2:03 PM

## 2017-02-16 NOTE — Progress Notes (Signed)
Echocardiogram Echocardiogram Transesophageal has been performed.  Jesus PartridgeBrooke [name] 02/16/2017, 9:50 AM

## 2017-02-16 NOTE — Progress Notes (Signed)
I met with pt's wife at bedside for pt downstairs receiving a LOOP. We discussed goals and expectations of an inpt rehab admit.I have begun insurance approval for a possible inpt rehab admission pending Tuesday when bed available. I will follow up once I hear from insurance. [phone]

## 2017-02-16 NOTE — Consult Note (Signed)
ELECTROPHYSIOLOGY CONSULT NOTE  Patient ID: [name] MRN: [id number], DOB/AGE: May 07, 1965   Admit date: 02/12/2017 Date of Consult: 02/16/2017  Primary Physician: Porfirio Oar, PA-C Primary Cardiologist: none Reason for Consultation: Cryptogenic stroke ; recommendations regarding Implantable Loop Recorder requested by Dr. Lucia Gaskins  History of Present Illness [name] was admitted on 02/12/2017 with L arm/leg weakness/numbness, iitially treated with TPA.  They first developed symptoms while at home. PMHx noted for HTN, obesity only.   Imaging demonstrated right MCA including right insular cortex and right parietal cortex small infarcts as well as right punctate cerebellar infarct, embolic pattern, unclear source.  he has undergone workup for stroke including echocardiogram and carotid angio.  The patient has been monitored on telemetry which has demonstrated sinus rhythm with no arrhythmias.  Inpatient stroke work-up is to be completed with a TEE.   Echocardiogram this admission demonstrated   02/13/2017 Study Conclusions - Left ventricle: The cavity size was normal. There was mild concentric hypertrophy. Systolic function was vigorous. The estimated ejection fraction was in the range of 65% to 70%. Wall motion was normal; there were no regional wall motion abnormalities. Doppler parameters are consistent with abnormal left ventricular relaxation (grade 1 diastolic dysfunction). - Aortic valve: There was mild regurgitation. - Aorta: Aortic root dimension: 39 mm (ED). - Ascending aorta: The ascending aorta was mildly dilated. Impressions: - No cardiac source of emboli was indentified.  Lab work is reviewed.  Prior to admission, the patient denied chest pain, shortness of breath, dizziness, palpitations, or syncope.  They are recovering from their stroke with plans to CIR at discharge.  EP has been asked to evaluate for placement of an implantable loop recorder to  monitor for atrial fibrillation.     Past Medical History:  Diagnosis Date  . Fall from roof    with kidney trauma  . Hypertension 2018     Surgical History:  Past Surgical History:  Procedure Laterality Date  . NO PAST SURGERIES       Medications Prior to Admission  Medication Sig Dispense Refill Last Dose  . acetaminophen (TYLENOL) 325 MG tablet Take 650 mg by mouth every 6 (six) hours as needed for mild pain.   Past Month at Unknown time  . amLODipine (NORVASC) 10 MG tablet Take 1 tablet (10 mg total) by mouth daily. 90 tablet 3 02/12/2017 at Unknown time  . cetirizine (ZYRTEC) 10 MG tablet Take 10 mg by mouth daily as needed for allergies.   Past Week at Unknown time  . ibuprofen (ADVIL,MOTRIN) 200 MG tablet Take 200 mg by mouth every 6 (six) hours as needed for moderate pain.   Past Month at Unknown time    Inpatient Medications:  . [MAR Hold] amLODipine  10 mg Oral Daily  . [MAR Hold] aspirin EC  325 mg Oral Daily  . [MAR Hold] atorvastatin  40 mg Oral q1800  . [MAR Hold] docusate sodium  200 mg Oral QHS  . [MAR Hold] enoxaparin (LOVENOX) injection  40 mg Subcutaneous Q24H  . [MAR Hold] lisinopril  20 mg Oral Daily  . [MAR Hold] pantoprazole  40 mg Oral Daily    Allergies:  Allergies  Allergen Reactions  . Penicillins Other (See Comments)    Headache Has patient had a PCN reaction causing immediate rash, facial/tongue/throat swelling, SOB or lightheadedness with hypotension: No Has patient had a PCN reaction causing severe rash involving mucus membranes or skin necrosis: No Has patient had a PCN reaction  that required hospitalization: No Has patient had a PCN reaction occurring within the last 10 years:No If all of the above answers are "NO", then may proceed with Cephalosporin use.     Social History   Socioeconomic History  . Marital status: Married    Spouse name: Vickie  . Number of children: 0  . Years of education: College  . Highest education level:  Not on file  Social Needs  . Financial resource strain: Not on file  . Food insecurity - worry: Not on file  . Food insecurity - inability: Not on file  . Transportation needs - medical: Not on file  . Transportation needs - non-medical: Not on file  Occupational History  . Occupation: outside Airline pilot    Comment: [employer]  Tobacco Use  . Smoking status: Never Smoker  . Smokeless tobacco: Never Used  Substance and Sexual Activity  . Alcohol use: Yes    Alcohol/week: 0.0 oz    Comment: rarely, 1-2 times/year  . Drug use: No  . Sexual activity: Yes    Partners: Female  Other Topics Concern  . Not on file  Social History Narrative   Lives with his wife.     Family History  Problem Relation Age of Onset  . Hypertension Mother   . Hypertension Father   . Stroke Father   . Hypertension Sister   . Leukemia Paternal Uncle       Review of Systems: All other systems reviewed and are otherwise negative except as noted above.  Physical Exam: Vitals:   02/16/17 0925 02/16/17 0930 02/16/17 0935 02/16/17 0942  BP: (!) 181/86  (!) 151/83 (!) 150/85  Pulse: 92 88 96 90  Resp: 12 15 18 15   Temp:    98.2 F (36.8 C)  TempSrc:    Oral  SpO2: 95% 94% 96% 94%  Weight:      Height:        GEN- The patient is well appearing, alert and oriented x 3 today.   Head- normocephalic, atraumatic Eyes-  Sclera clear, conjunctiva pink Ears- hearing intact Oropharynx- clear Neck- supple Lungs- CTA b/l, normal work of breathing Heart- RRR, no murmurs, rubs or gallops  GI- soft, NT, ND Extremities- no clubbing, cyanosis, or edema MS- no significant deformity or atrophy Skin- no rash or lesion Psych- euthymic mood, full affect   Labs:   Lab Results  Component Value Date   WBC 9.8 02/15/2017   HGB 15.3 02/15/2017   HCT 44.7 02/15/2017   MCV 90.5 02/15/2017   PLT 267 02/15/2017    Recent Labs  Lab 02/12/17 1019  02/15/17 0328  NA 139   < > 137  K 3.8   < > 3.8  CL  110   < > 107  CO2 20*   < > 23  BUN 19   < > 15  CREATININE 1.07   < > 1.16  CALCIUM 9.4   < > 9.3  PROT 7.2  --   --   BILITOT 1.3*  --   --   ALKPHOS 85  --   --   ALT 34  --   --   AST 27  --   --   GLUCOSE 115*   < > 96   < > = values in this interval not displayed.   No results found for: CKTOTAL, CKMB, CKMBINDEX, TROPONINI Lab Results  Component Value Date   CHOL 185 02/13/2017   CHOL  205 (H) 11/26/2016   Lab Results  Component Value Date   HDL 28 (L) 02/13/2017   HDL 34 (L) 11/26/2016   Lab Results  Component Value Date   LDLCALC 137 (H) 02/13/2017   LDLCALC 143 (H) 11/26/2016   Lab Results  Component Value Date   TRIG 98 02/13/2017   TRIG 139 11/26/2016   Lab Results  Component Value Date   CHOLHDL 6.6 02/13/2017   CHOLHDL 6.0 (H) 11/26/2016   No results found for: LDLDIRECT  No results found for: DDIMER   Radiology/Studies:  Ct Angio Head W Or Wo Contrast Result Date: 02/12/2017 CLINICAL DATA:  Acute presentation with left-sided numbness an weakness. EXAM: CT ANGIOGRAPHY HEAD AND NECK TECHNIQUE: Multidetector CT imaging of the head and neck was performed using the standard protocol during bolus administration of intravenous contrast. Multiplanar CT image reconstructions and MIPs were obtained to evaluate the vascular anatomy. Carotid stenosis measurements (when applicable) are obtained utilizing NASCET criteria, using the distal internal carotid diameter as the denominator. CONTRAST:  50mL ISOVUE-370 IOPAMIDOL (ISOVUE-370) INJECTION 76% COMPARISON:  CT earlier same day FINDINGS: CTA NECK FINDINGS Aortic arch: The aorta is normal without atherosclerotic change, aneurysm or dissection. Branching pattern of the brachiocephalic vessels from the arch is normal. Right carotid system: Common carotid artery widely patent to the bifurcation. Carotid bifurcation is normal without soft or calcified plaque. Cervical ICA is tortuous but normal. Left carotid system: Common  carotid artery widely patent to the bifurcation region. Left carotid bifurcation is similarly normal without soft or calcified plaque. Cervical ICA is tortuous but widely patent. Vertebral arteries: Both vertebral artery origins are widely patent. The vertebral arteries are approximately equal in size and widely patent through the cervical region. Skeleton: Ordinary mild midcervical spondylosis. Other neck: No mass or lymphadenopathy. Upper chest: Few small areas of indistinct density in the right upper lobe, measuring about 5-8 mm in size. Differential diagnosis is inflammatory foci versus masses. Complete chest CT recommended at some point for complete evaluation. Review of the MIP images confirms the above findings CTA HEAD FINDINGS Anterior circulation: Both internal carotid arteries are widely patent through the skullbase and siphon regions. The anterior and middle cerebral vessels are patent without proximal stenosis, aneurysm or vascular malformation. No embolic disease identified. Posterior circulation: Both vertebral arteries are patent to the basilar. No basilar stenosis. Posterior circulation branch vessels appear patent. Venous sinuses: Patent and normal. Anatomic variants: None significant Delayed phase: Delayed phase not acquired. Review of the MIP images confirms the above findings IMPRESSION: Negative for large or medium vessel occlusion. No sign of atherosclerotic vascular disease, dissection or other focal vascular finding. Incidental detection of indistinct nodular shadows in the right upper lobe. These could represent inflammatory foci or nodules. Complete chest CT recommended at some point. These results were communicated by text at the time of interpretation on 02/12/2017 at 10:43 am to Dr. Arther Dames. Electronically Signed   By: Paulina Fusi M.D.   On: 02/12/2017 10:51   Ct Head Wo Contrast Result Date: 02/13/2017 CLINICAL DATA:  Stroke patient post tPA therapy. EXAM: CT HEAD WITHOUT  CONTRAST TECHNIQUE: Contiguous axial images were obtained from the base of the skull through the vertex without intravenous contrast. COMPARISON:  Brain MRI 02/12/2017, head CT 02/12/2017 FINDINGS: Brain: No evidence of intracranial hemorrhage. Small linear areas of acute/ subacute infarction in the right insula and right parietal lobe demonstrated by MRI are not seen by CT. The ventricular system is stable. Vascular: No hyperdense  vessel or unexpected calcification. Skull: Normal. Negative for fracture or focal lesion. Sinuses/Orbits: No acute finding. Other: None. IMPRESSION: No evidence of acute intracranial hemorrhage. Small linear areas of acute/ subacute infarction in the right insula and right parietal lobe are not demonstrated by CT. Electronically Signed   By: Ted Mcalpine M.D.   On: 02/13/2017 10:34    Mr Brain Wo Contrast Result Date: 02/12/2017 CLINICAL DATA:  52 y/o  M; left-sided weakness and numbness. EXAM: MRI HEAD WITHOUT CONTRAST TECHNIQUE: Multiplanar, multiecho pulse sequences of the brain and surrounding structures were obtained without intravenous contrast. COMPARISON:  02/13/2016 CT of the head. FINDINGS: Brain: Small area of reduced diffusion within right posterior insula and right lateral parietal compatible with acute/early subacute infarction. No associated hemorrhage or mass effect. Punctate focus of diffusion hyperintensity with intermediate diffusion and increased T2 FLAIR signal and right superior cerebellar hemisphere, likely subacute infarct. No focal mass effect, extra-axial collection, hydrocephalus, or effacement of basilar cisterns. Vascular: Normal flow voids. Skull and upper cervical spine: Normal marrow signal. Sinuses/Orbits: Negative. Other: None. IMPRESSION: 1. Small areas of acute/early subacute cortical infarction in right posterior insula and right lateral parietal lobe. No hemorrhage or mass effect. 2. Very small right superior cerebellar hemisphere subacute  infarct. These results Antonia Jicha be called to the ordering clinician or representative by the Radiologist Assistant, and communication documented in the PACS or zVision Dashboard. Electronically Signed   By: Mitzi Hansen M.D.   On: 02/12/2017 16:27   Ct Head Code Stroke Wo Contrast Result Date: 02/12/2017 CLINICAL DATA:  Code stroke. Acute presentation with left-sided numbness in weakness. Last seen normal 90 minutes ago. EXAM: CT HEAD WITHOUT CONTRAST TECHNIQUE: Contiguous axial images were obtained from the base of the skull through the vertex without intravenous contrast. COMPARISON:  None. FINDINGS: Brain: Normal appearance the supratentorial brain without evidence of old or acute infarction, mass lesion, hemorrhage, hydrocephalus or extra-axial collection. In the cerebellum, there are possible areas of low-density in the superior cerebellum on the right that could represent old or recent infarctions. No evidence of mass lesion, hemorrhage, hydrocephalus or extra-axial collection. Vascular:  No abnormal vascular findings. Skull: Normal Sinuses/Orbits: Clear/normal Other: None ASPECTS ([place] Stroke Program Early CT Score) - Ganglionic level infarction (caudate, lentiform nuclei, internal capsule, insula, M1-M3 cortex): 7 - Supraganglionic infarction (M4-M6 cortex): 3 Total score (0-10 with 10 being normal): 10 IMPRESSION: 1. No abnormal supratentorial finding. No cause of left-sided numbness and weakness is identified. 2. ASPECTS is 10. Question areas of low-density in the right cerebellum that could be recent or old infarctions. These results were communicated to at 10:29 amon 1/3/2019by text page via the [facility] messaging system. Electronically Signed   By: Paulina Fusi M.D.   On: 02/12/2017 10:31    12-lead ECG SR All prior EKG's in EPIC reviewed with no documented atrial fibrillation  Telemetry reviewed by Dr. Elberta Fortis, noting SR only  Assessment and Plan:  1. Cryptogenic stroke The patient  presents with cryptogenic stroke.  The patient has a TEE planned for this AM.  Dr. Elberta Fortis spoke at length with the patient about monitoring for afib with either a 30 day event monitor or an implantable loop recorder.  Risks, benefits, and alteratives to implantable loop recorder were discussed with the patient today.   At this time, the patient is very clear in their decision to proceed with implantable loop recorder.   Wound care was reviewed with the patient (keep incision clean and dry for 3 days).  Wound check Lanay Zinda be scheduled for the patient  Please call with questions.   Sheilah PigeonRenee Lynn Ursuy, PA-C 02/16/2017   I have seen and examined this patient with Francis Dowseenee Ursuy.  Agree with above, note added to reflect my findings.  On exam, RRR, no murmurs, lungs clear.  Had CVA. TEE negative. Plan for LINQ implant. Risks include bleeding, infection. The patient understands the risks and has agreed to the procedure.  Marleny Faller M. Jaison Petraglia MD 02/16/2017 11:00 AM

## 2017-02-16 NOTE — Progress Notes (Signed)
STROKE TEAM PROGRESS NOTE    HISTORY PER H&P - 02/12/2017 Jesus IlesChester Villasenor Li is an 52 y.o. male with PMH of HTN, obesity presents as stroke alert for sudden onset weakness and numbness in left side.  Patient states he was last known normal at 9 am and was working from home when he noticed his left arm and leg became numb and weak and called 911. EMS noted significant weakness on left side and unable to hold arm, unable to walk. BP was 160 systolic, FSG was normal. Patients symptoms improved on transport to [facility], however continued to have mild weakness, loss of sensation, dysarthria on arrival. Unable to walk without support on assessment. Ct head was negative and he received tPA after 10mg  labetolol for blood pressure. No recent GI bleed/surgery.  CTA head and neck was negative for LVO. Takes amlodipine for HTN.   Date last known well: 02/12/17 Time last known well: 9 am tPA Given: yes NIHSS: 5 Baseline MRS 0  SUBJECTIVE (INTERVAL HISTORY) No family is at bedside, He had TEE and Loop placement this morning. TEE negative. No events overnight, Voice no new concerns. Working with therapies. Hopeful for d/c to CIR in AM  OBJECTIVE Temp:  [98 F (36.7 C)-99 F (37.2 C)] 98.2 F (36.8 C) (01/07 0942) Pulse Rate:  [57-97] 90 (01/07 0942) Cardiac Rhythm: Normal sinus rhythm (01/07 0721) Resp:  [12-18] 15 (01/07 0942) BP: (126-197)/(71-106) 168/75 (01/07 0954) SpO2:  [94 %-100 %] 94 % (01/07 0942)  Recent Labs  Lab 02/12/17 1012  GLUCAP 116*   Recent Labs  Lab 02/12/17 1017  02/12/17 1019 02/13/17 0753 02/14/17 0354 02/15/17 0328  NA 141  --  139 136 137 137  K 3.8  --  3.8 3.7 3.6 3.8  CL 107  --  110 106 106 107  CO2  --   --  20* 22 21* 23  GLUCOSE 117*  --  115* 110* 91 96  BUN 21*  --  19 11 12 15   CREATININE 1.10  --  1.07 0.99 0.97 1.16  CALCIUM  --    < > 9.4 8.8* 9.6 9.3   < > = values in this interval not displayed.   Recent Labs  Lab 02/12/17 1019  AST 27  ALT 34   ALKPHOS 85  BILITOT 1.3*  PROT 7.2  ALBUMIN 3.9   Recent Labs  Lab 02/12/17 1017 02/12/17 1019 02/13/17 0753 02/14/17 0354 02/15/17 0328  WBC  --  8.2 8.1 6.5 9.8  NEUTROABS  --  4.7  --   --   --   HGB 16.0 16.4 14.8 14.7 15.3  HCT 47.0 46.6 43.3 43.4 44.7  MCV  --  89.6 89.6 91.4 90.5  PLT  --  245 248 248 267      Component Value Date/Time   CHOL 185 02/13/2017 0753   CHOL 205 (H) 11/26/2016 1040   TRIG 98 02/13/2017 0753   HDL 28 (L) 02/13/2017 0753   HDL 34 (L) 11/26/2016 1040   CHOLHDL 6.6 02/13/2017 0753   VLDL 20 02/13/2017 0753   LDLCALC 137 (H) 02/13/2017 0753   LDLCALC 143 (H) 11/26/2016 1040   Lab Results  Component Value Date   HGBA1C 5.5 02/13/2017      Component Value Date/Time   LABOPIA NONE DETECTED 02/12/2017 1250   COCAINSCRNUR NONE DETECTED 02/12/2017 1250   LABBENZ NONE DETECTED 02/12/2017 1250   AMPHETMU NONE DETECTED 02/12/2017 1250   THCU NONE DETECTED  02/12/2017 1250   LABBARB NONE DETECTED 02/12/2017 1250    Recent Labs  Lab 02/12/17 1019  ETH <10    IMAGING  I have personally reviewed the radiological images below and agree with the radiology interpretations.  Ct Angio Head W Or Wo Contrast Ct Angio Neck W Or Wo Contrast 02/12/2017 IMPRESSION:  Negative for large or medium vessel occlusion. No sign of atherosclerotic vascular disease, dissection or other focal vascular finding.  Incidental detection of indistinct nodular shadows in the right upper lobe. These could represent inflammatory foci or nodules. Complete chest CT recommended at some point.   Ct Head Wo Contrast 02/13/2017 IMPRESSION:  No evidence of acute intracranial hemorrhage.  Small linear areas of acute/ subacute infarction in the right insula and right parietal lobe are not demonstrated by CT.   Mr Brain Wo Contrast 02/12/2017 IMPRESSION:  1. Small areas of acute/early subacute cortical infarction in right posterior insula and right lateral parietal lobe. No  hemorrhage or mass effect.  2. Very small right superior cerebellar hemisphere subacute infarct.   Ct Head Code Stroke Wo Contrast 02/12/2017  IMPRESSION:  1. No abnormal supratentorial finding. No cause of left-sided numbness and weakness is identified.  2. ASPECTS is 10. Question areas of low-density in the right cerebellum that could be recent or old infarctions.   B/L LE Doppler - negative for DVT  TCD bubble study  02/13/2017 FINAL INTERPRETATION A vascular evaluation was performed. The left middle cerebral artery was studied. An IV was inserted into the patients' left Forearm using aseptic precautions. Verbal informed consent was obtained. There is no evidence of high intensity transient signals (HITS) at rest or with valsalva. Therefore, there is no obvious evidence of patent foramen ovale (PFO). *See table(s) above for measurements and observations.  TTE  02/13/2017 Study Conclusions - Left ventricle: The cavity size was normal. There was mild   concentric hypertrophy. Systolic function was vigorous. The   estimated ejection fraction was in the range of 65% to 70%. Wall   motion was normal; there were no regional wall motion   abnormalities. Doppler parameters are consistent with abnormal   left ventricular relaxation (grade 1 diastolic dysfunction). - Aortic valve: There was mild regurgitation. - Aorta: Aortic root dimension: 39 mm (ED). - Ascending aorta: The ascending aorta was mildly dilated. Impressions: - No cardiac source of emboli was indentified.  TEE 02/16/2017 Study Conclusions - Left ventricle: Systolic function was normal. The estimated   ejection fraction was in the range of 60% to 65%. Wall motion was   normal; there were no regional wall motion abnormalities. - Aortic valve: No evidence of vegetation. There was mild   regurgitation. - Mitral valve: No evidence of vegetation. - Left atrium: No evidence of thrombus in the atrial cavity or   appendage. No  evidence of thrombus in the appendage. - Right atrium: No evidence of thrombus in the atrial cavity or   appendage. No evidence of thrombus in the atrial cavity or   appendage. - Atrial septum: No defect or patent foramen ovale was identified.   Echo contrast study showed no right-to-left atrial level shunt,   following an increase in RA pressure induced by provocative   maneuvers. - Tricuspid valve: No evidence of vegetation. - Pulmonic valve: No evidence of vegetation. - Superior vena cava: The study excluded a thrombus. Impressions:- No cardiac source of emboli was indentified.  PHYSICAL EXAM  Temp:  [98 F (36.7 C)-99 F (37.2 C)] 98.2 F (36.8  C) (01/07 0942) Pulse Rate:  [57-97] 90 (01/07 0942) Resp:  [12-18] 15 (01/07 0942) BP: (126-197)/(71-106) 168/75 (01/07 0954) SpO2:  [94 %-100 %] 94 % (01/07 0942)  General - Well nourished, well developed, in no apparent distress.  Ophthalmologic - fundi not visualized due to noncooperation.  Cardiovascular - Regular rate and rhythm with no murmur.  Mental Status -  Level of arousal and orientation to time, place, and person were intact. Language including expression, naming, repetition, comprehension was assessed and found intact. Fund of Knowledge was assessed and was intact.  Cranial Nerves II - XII - II - Visual field intact OU. Li, IV, VI - Extraocular movements intact. V - Facial sensation intact bilaterally. VII - left facial droop. VIII - Hearing & vestibular intact bilaterally. X - Palate elevates symmetrically, mild dysarthria. XI - Chin turning & shoulder shrug intact bilaterally. XII - Tongue protrusion to the left  Motor Strength - The patient's strength was 5/5 RUE and RLE, 4/5 LUE and 5-/5 LLE. No pronator drift seen.  Bulk was normal and fasciculations were absent.   Motor Tone - Muscle tone was assessed at the neck and appendages and was normal.  Reflexes - The patient's reflexes were symmetrical in all  extremities and he had no pathological reflexes.  Sensory - Light touch, temperature/pinprick were assessed and were symmetrical.    Coordination - The patient had mild dysmetria on left FTN proportional to the weakness.  Tremor was absent.  Gait and Station - deferred.   ASSESSMENT/PLAN Mr. [name] is a 52 y.o. male with history of HTN, obesity admitted for left sided weakness, numbness, slurry speech. 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  TCD bubble study - no obvious evidence of patent foramen ovale (PFO)  LE venous doppler Negative  TEE/Loop 02/16/2017.  Hypercoagulable work up pending  LDL 137  HgbA1c 5.5  lovenox for VTE prophylaxis  Diet NPO time specified Except for: Sips with Meds   No antithrombotic prior to admission, now on aspirin 325 mg daily.   Patient counseled to be compliant with his antithrombotic medications  Ongoing aggressive stroke risk factor management  Therapy recommendations: CIR in AM, insurance pending  Disposition:  CIR  Hypertension Stable Permissive hypertension (OK if <180/105) for 24-48 hours post stroke and then gradually normalized within 5-7 days.  Long term BP goal normotensive  Hyperlipidemia  Home meds:  none   LDL 137, goal < 70  Now on lipitor 40  Continue statin at discharge  Other Stroke Risk Factors  ETOH use, educated to limit to less than 2 drinks per day  Obesity, Body mass index is 39.42 kg/m.   Other Active Problems  The patient needs an outpatient chest CT to evaluate right  upper lobe pulmonary nodules.  Patient will likely need an outpatient sleep study for OSA  Assessment Plan  CIR admission planned for tomorrow, insurance pending  Research nurse to discuss [facility] Stroke Prevention [facility] day # 4    Brita Romp Stroke Neurology Team I have personally examined this  patient, reviewed notes, independently viewed imaging studies, participated in medical decision making and plan of care.ROS completed by me personally and pertinent positives fully documented  I have made any additions or clarifications directly to the above note. Agree with note above.    Delia Heady, MD Medical Director [facility] Pager: [phone] 02/16/2017 3:12 PM  To contact Stroke Continuity provider, please refer to [email or web address].After hours, contact General Neurology

## 2017-02-16 NOTE — Progress Notes (Signed)
PT Cancellation Note  Patient Details Name: [name] MRN: [id number] DOB: 23-Jul-1965   Cancelled Treatment:    Reason Eval/Treat Not Completed: Patient at procedure or test/unavailable. Pt now off the floor for LOOP recorder placement. Pt to return as able to complete treatment.   Lewis ShockAshly Brailynn Breth, PT, DPT Pager #: [phone] Office #: [phone]   Rozell Searingshly M Tiombe Tomeo 02/16/2017, 10:48 AM

## 2017-02-16 NOTE — Discharge Instructions (Signed)
Implanted monitor site care Keep incision clean and dry for 3 days. You can remove outer dressing tomorrow. Leave steri-strips (little pieces of tape) on until seen in the office for wound check appointment. Call the office [phone] for redness, drainage, swelling, or fever.

## 2017-02-16 NOTE — Interval H&P Note (Signed)
History and Physical Interval Note:  02/16/2017 8:51 AM  [name]  has presented today for surgery, with the diagnosis of stroke  The various methods of treatment have been discussed with the patient and family. After consideration of risks, benefits and other options for treatment, the patient has consented to  Procedure(s): TRANSESOPHAGEAL ECHOCARDIOGRAM (TEE) WITH LOOP (N/A) as a surgical intervention .  The patient's history has been reviewed, patient examined, no change in status, stable for surgery.  I have reviewed the patient's chart and labs.  Questions were answered to the patient's satisfaction.     [employer] Skains

## 2017-02-16 NOTE — Progress Notes (Signed)
PT Cancellation Note  Patient Details Name: [name] MRN: [id number] DOB: 1965/03/15   Cancelled Treatment:    Reason Eval/Treat Not Completed: Patient at procedure or test/unavailable. Pt off the floor at TEE. Acute PT to return as able.   [name] [name] 02/16/2017, 8:37 AM  [name], PT, DPT Pager #: [phone] Office #: [phone]

## 2017-02-16 NOTE — CV Procedure (Signed)
    Transesophageal echocardiogram  Indications: Stroke  Timeout performed:During this procedure the patient is administered a total of Versed 4 mg and Fentanyl 50 mcg to achieve and maintain moderate conscious sedation.  The patient's heart rate, blood pressure, and oxygen saturation are monitored continuously during the procedure. The period of conscious sedation is 25 minutes, of which I was present face-to-face 100% of this time.  Findings: - Mild aortic regurgitation -Trace mitral regurgitation -No left atrial appendage thrombus. -Normal ejection fraction 55% -Negative bubble study, no shunt detected across the intra-atrial septum. -No embolic source identified.  Jesus SchultzMark Holland Kotter, MD

## 2017-02-16 NOTE — Progress Notes (Signed)
OT Cancellation Note  Patient Details Name: [name] MRN: [id number] DOB: Feb 12, 1965   Cancelled Treatment:       Reason Eval/Treat Not Completed: Patient at procedure or test/unavailable. Pt off the floor at TEE. Acute OT to return as able.    Charletta CousinBarnhill, Amy Beth Dixon, OTR/L 02/16/2017, 9:19 AM

## 2017-02-17 ENCOUNTER — Other Ambulatory Visit: Payer: Self-pay

## 2017-02-17 ENCOUNTER — Inpatient Hospital Stay (HOSPITAL_COMMUNITY)
Admission: RE | Admit: 2017-02-17 | Discharge: 2017-02-26 | DRG: 057 | Disposition: A | Discharge status: Home / Self Care | Payer: BLUE CROSS/BLUE SHIELD | Source: Intra-hospital | Attending: Physical Medicine & Rehabilitation | Admitting: Physical Medicine & Rehabilitation

## 2017-02-17 ENCOUNTER — Encounter (HOSPITAL_COMMUNITY): Payer: Self-pay | Admitting: Cardiology

## 2017-02-17 ENCOUNTER — Encounter (HOSPITAL_COMMUNITY): Payer: Self-pay | Admitting: *Deleted

## 2017-02-17 DIAGNOSIS — I10 Essential (primary) hypertension: Secondary | ICD-10-CM | POA: Diagnosis present

## 2017-02-17 DIAGNOSIS — I63511 Cerebral infarction due to unspecified occlusion or stenosis of right middle cerebral artery: Secondary | ICD-10-CM | POA: Diagnosis present

## 2017-02-17 DIAGNOSIS — R739 Hyperglycemia, unspecified: Secondary | ICD-10-CM | POA: Diagnosis present

## 2017-02-17 DIAGNOSIS — I69322 Dysarthria following cerebral infarction: Secondary | ICD-10-CM

## 2017-02-17 DIAGNOSIS — I69354 Hemiplegia and hemiparesis following cerebral infarction affecting left non-dominant side: Secondary | ICD-10-CM | POA: Diagnosis present

## 2017-02-17 DIAGNOSIS — I69318 Other symptoms and signs involving cognitive functions following cerebral infarction: Secondary | ICD-10-CM | POA: Diagnosis not present

## 2017-02-17 DIAGNOSIS — K219 Gastro-esophageal reflux disease without esophagitis: Secondary | ICD-10-CM | POA: Diagnosis present

## 2017-02-17 DIAGNOSIS — Z6839 Body mass index (BMI) 39.0-39.9, adult: Secondary | ICD-10-CM

## 2017-02-17 DIAGNOSIS — J302 Other seasonal allergic rhinitis: Secondary | ICD-10-CM | POA: Diagnosis present

## 2017-02-17 DIAGNOSIS — E669 Obesity, unspecified: Secondary | ICD-10-CM | POA: Diagnosis present

## 2017-02-17 DIAGNOSIS — I69392 Facial weakness following cerebral infarction: Secondary | ICD-10-CM | POA: Diagnosis not present

## 2017-02-17 DIAGNOSIS — R911 Solitary pulmonary nodule: Secondary | ICD-10-CM | POA: Diagnosis present

## 2017-02-17 DIAGNOSIS — Z713 Dietary counseling and surveillance: Secondary | ICD-10-CM

## 2017-02-17 DIAGNOSIS — Z880 Allergy status to penicillin: Secondary | ICD-10-CM

## 2017-02-17 DIAGNOSIS — Z823 Family history of stroke: Secondary | ICD-10-CM | POA: Diagnosis not present

## 2017-02-17 DIAGNOSIS — E639 Nutritional deficiency, unspecified: Secondary | ICD-10-CM | POA: Diagnosis not present

## 2017-02-17 DIAGNOSIS — E785 Hyperlipidemia, unspecified: Secondary | ICD-10-CM

## 2017-02-17 DIAGNOSIS — Z79899 Other long term (current) drug therapy: Secondary | ICD-10-CM | POA: Diagnosis not present

## 2017-02-17 DIAGNOSIS — Z8249 Family history of ischemic heart disease and other diseases of the circulatory system: Secondary | ICD-10-CM | POA: Diagnosis not present

## 2017-02-17 HISTORY — DX: Cerebral infarction due to unspecified occlusion or stenosis of right middle cerebral artery: I63.511

## 2017-02-17 LAB — ALPHA GALACTOSIDASE: Alpha galactosidase, serum: 32.5 nmol/hr/mg prt (ref 28.0–80.0)

## 2017-02-17 MED ORDER — LORATADINE 10 MG PO TABS
10.0000 mg | ORAL_TABLET | Freq: Every day | ORAL | Status: DC
  Administered 2017-02-17 – 2017-02-26 (×10): 10 mg via ORAL
  Filled 2017-02-17 (×10): qty 1

## 2017-02-17 MED ORDER — POLYETHYLENE GLYCOL 3350 17 G PO PACK: 17.0000 g | PACK | Freq: Every day | ORAL | Status: DC | PRN

## 2017-02-17 MED ORDER — DIPHENHYDRAMINE HCL 12.5 MG/5ML PO ELIX: 12.5000 mg | ORAL_SOLUTION | Freq: Four times a day (QID) | ORAL | Status: DC | PRN

## 2017-02-17 MED ORDER — ACETAMINOPHEN 325 MG PO TABS: 325.0000 mg | ORAL_TABLET | ORAL | Status: DC | PRN

## 2017-02-17 MED ORDER — ASPIRIN EC 325 MG PO TBEC
325.0000 mg | DELAYED_RELEASE_TABLET | Freq: Every day | ORAL | Status: DC
  Administered 2017-02-18 – 2017-02-26 (×9): 325 mg via ORAL
  Filled 2017-02-17 (×9): qty 1

## 2017-02-17 MED ORDER — DOCUSATE SODIUM 100 MG PO CAPS
200.0000 mg | ORAL_CAPSULE | Freq: Every day | ORAL | Status: DC
  Administered 2017-02-17 – 2017-02-25 (×9): 200 mg via ORAL
  Filled 2017-02-17 (×9): qty 2

## 2017-02-17 MED ORDER — FLUTICASONE PROPIONATE 50 MCG/ACT NA SUSP
1.0000 | Freq: Every day | NASAL | Status: DC | PRN
  Filled 2017-02-17: qty 16

## 2017-02-17 MED ORDER — ALUM & MAG HYDROXIDE-SIMETH 200-200-20 MG/5ML PO SUSP
30.0000 mL | ORAL | Status: DC | PRN
  Administered 2017-02-17: 30 mL via ORAL
  Filled 2017-02-17 (×2): qty 30

## 2017-02-17 MED ORDER — BISACODYL 10 MG RE SUPP: 10.0000 mg | Freq: Every day | RECTAL | Status: DC | PRN

## 2017-02-17 MED ORDER — PANTOPRAZOLE SODIUM 40 MG PO TBEC
40.0000 mg | DELAYED_RELEASE_TABLET | Freq: Every day | ORAL | Status: DC
  Filled 2017-02-17: qty 1

## 2017-02-17 MED ORDER — PROCHLORPERAZINE EDISYLATE 5 MG/ML IJ SOLN: 5.0000 mg | Freq: Four times a day (QID) | INTRAMUSCULAR | Status: DC | PRN

## 2017-02-17 MED ORDER — FLEET ENEMA 7-19 GM/118ML RE ENEM: 1.0000 | ENEMA | Freq: Once | RECTAL | Status: DC | PRN

## 2017-02-17 MED ORDER — PROCHLORPERAZINE MALEATE 5 MG PO TABS: 5.0000 mg | ORAL_TABLET | Freq: Four times a day (QID) | ORAL | Status: DC | PRN

## 2017-02-17 MED ORDER — ENOXAPARIN SODIUM 40 MG/0.4ML ~~LOC~~ SOLN
40.0000 mg | SUBCUTANEOUS | Status: DC
  Administered 2017-02-17 – 2017-02-25 (×9): 40 mg via SUBCUTANEOUS
  Filled 2017-02-17 (×9): qty 0.4

## 2017-02-17 MED ORDER — LISINOPRIL 20 MG PO TABS
20.0000 mg | ORAL_TABLET | Freq: Every day | ORAL | Status: DC
  Administered 2017-02-18 – 2017-02-26 (×9): 20 mg via ORAL
  Filled 2017-02-17 (×7): qty 1
  Filled 2017-02-17: qty 2
  Filled 2017-02-17: qty 1

## 2017-02-17 MED ORDER — GUAIFENESIN-DM 100-10 MG/5ML PO SYRP: 5.0000 mL | ORAL_SOLUTION | Freq: Four times a day (QID) | ORAL | Status: DC | PRN

## 2017-02-17 MED ORDER — PROCHLORPERAZINE 25 MG RE SUPP: 12.5000 mg | Freq: Four times a day (QID) | RECTAL | Status: DC | PRN

## 2017-02-17 MED ORDER — ATORVASTATIN CALCIUM 40 MG PO TABS
40.0000 mg | ORAL_TABLET | Freq: Every day | ORAL | Status: DC
  Administered 2017-02-17 – 2017-02-25 (×9): 40 mg via ORAL
  Filled 2017-02-17 (×9): qty 1

## 2017-02-17 MED ORDER — TRAZODONE HCL 50 MG PO TABS: 25.0000 mg | ORAL_TABLET | Freq: Every evening | ORAL | Status: DC | PRN

## 2017-02-17 MED ORDER — AMLODIPINE BESYLATE 10 MG PO TABS
10.0000 mg | ORAL_TABLET | Freq: Every day | ORAL | Status: DC
  Administered 2017-02-18 – 2017-02-26 (×9): 10 mg via ORAL
  Filled 2017-02-17 (×9): qty 1

## 2017-02-17 NOTE — Progress Notes (Signed)
(Current) from 02/12/2017 in [facility]      Signed           [] Hide copied text  [] Hover for details   PMR Admission Coordinator Pre-Admission Assessment  Patient: [name] is an 52 y.o., male MRN: [id number] DOB: 03/25/1965 Height: 5\' 11"  (180.3 cm) Weight: 128.2 kg (282 lb 10.1 oz)                                                                                                                                                  Insurance Information HMO:     PPO:      PCP:      IPA:      80/20:      OTHER:  PRIMARY: [employer] [place]      Policy#: [id number]      Subscriber: pt CM Name: Kendal Hymen     Phone#: [phone]     Fax#: [phone] approved until 1/14 when updates are due Pre-Cert#: [id number]      Employer:  Benefits:  Phone #: [phone]     Name: 1/7 Eff. Date: 02/10/17     Deduct: $3000      Out of Pocket Max: $3000 does not include deductible      Life Max: none CIR: 80%      SNF: 80% 120 days Outpatient: 80%     Co-Pay: 90 days combined Home Health: 80%      Co-Pay: 90 visits DME: 80%     Co-Pay: 20% Providers: in network  SECONDARY: none        Medicaid Application Date:       Case Manager:  Disability Application Date:       Case Worker:   Emergency Actuary Information    Name Relation Home Work Mobile   Evitts,Vickie Spouse [phone]    Zakir, Henner Mother [phone]       Current Medical History  Patient Admitting Diagnosis: right parietal and cerebellar infarcts  History of Present Illness:  Jesus Boomer IIIis a 51 y.o.malewith history of HTN who was admitted on 02/12/17 with left facial and left sided weakness with numbness. MRI brain done showing "small areas of acute/early subacute cortical infarction in right posterior insula and right lateral parietal lobeas well as very small right superior cerebellar subacute infarct". UDS negative and work up  underway.Dr. Roda Shutters recommended work up for embolic source and TEE  And LOOP completed 02/16/17..   Total: 4 NIHSS  Past Medical History      Past Medical History:  Diagnosis Date  . Fall from roof    with kidney trauma  . Hypertension 2018    Family History  family history includes Hypertension in his father, mother, and sister; Leukemia in his paternal uncle; Stroke in  his father.  Prior Rehab/Hospitalizations:  Has the patient had major surgery during 100 days prior to admission? No  Current Medications   Current Facility-Administered Medications:  .  acetaminophen (TYLENOL) tablet 650 mg, 650 mg, Oral, Q4H PRN **OR** [DISCONTINUED] acetaminophen (TYLENOL) solution 650 mg, 650 mg, Per Tube, Q4H PRN **OR** [DISCONTINUED] acetaminophen (TYLENOL) suppository 650 mg, 650 mg, Rectal, Q4H PRN, Aroor, Georgiana Spinner R, MD .  amLODipine (NORVASC) tablet 10 mg, 10 mg, Oral, Daily, Ulice Dash, PA-C, 10 mg at 02/17/17 0900 .  aspirin EC tablet 325 mg, 325 mg, Oral, Daily, Marvel Plan, MD, 325 mg at 02/17/17 0900 .  atorvastatin (LIPITOR) tablet 40 mg, 40 mg, Oral, q1800, Marvel Plan, MD, 40 mg at 02/16/17 1745 .  docusate sodium (COLACE) capsule 200 mg, 200 mg, Oral, QHS, Naomie Dean B, MD, 200 mg at 02/16/17 2252 .  enoxaparin (LOVENOX) injection 40 mg, 40 mg, Subcutaneous, Q24H, Marvel Plan, MD, 40 mg at 02/16/17 1330 .  labetalol (NORMODYNE,TRANDATE) injection 10-20 mg, 10-20 mg, Intravenous, Q10 min PRN, Marvel Plan, MD .  lisinopril (PRINIVIL,ZESTRIL) tablet 20 mg, 20 mg, Oral, Daily, Marvel Plan, MD, 20 mg at 02/17/17 0900 .  pantoprazole (PROTONIX) EC tablet 40 mg, 40 mg, Oral, Daily, Marvel Plan, MD, 40 mg at 02/17/17 0900  Patients Current Diet: Diet Heart Room service appropriate? Yes; Fluid consistency: Thin  Precautions / Restrictions Precautions Precautions: Fall Restrictions Weight Bearing Restrictions: No   Has the patient had 2 or more falls or a fall with  injury in the past year?No  Prior Activity Level Community (5-7x/wk): independent, working, and Museum/gallery exhibitions officer / Equipment Home Assistive Devices/Equipment: None Home Equipment: None  Prior Device Use: Indicate devices/aids used by the patient prior to current illness, exacerbation or injury? None of the above  Prior Functional Level Prior Function Level of Independence: Independent Comments: Works in Psychologist, clinical, works from home  Self Care: Did the patient need help bathing, dressing, using the toilet or eating?  Independent  Indoor Mobility: Did the patient need assistance with walking from room to room (with or without device)? Independent  Stairs: Did the patient need assistance with internal or external stairs (with or without device)? Independent  Functional Cognition: Did the patient need help planning regular tasks such as shopping or remembering to take medications? Independent  Current Functional Level Cognition  Arousal/Alertness: Awake/alert Overall Cognitive Status: Within Functional Limits for tasks assessed Orientation Level: Oriented X4 Safety/Judgement: Decreased awareness of deficits, Decreased awareness of safety General Comments: pt unaware of left lean in sitting or standing Attention: Focused, Sustained, Selective, Alternating Focused Attention: Appears intact Sustained Attention: Appears intact Selective Attention: Appears intact Alternating Attention: Appears intact Memory: Impaired Memory Impairment: Storage deficit Problem Solving: Appears intact Executive Function: Reasoning, Self Monitoring Reasoning: Impaired Reasoning Impairment: Verbal complex, Functional complex Self Monitoring: Impaired Self Monitoring Impairment: Verbal complex, Functional complex Safety/Judgment: Impaired    Extremity Assessment (includes Sensation/Coordination)  Upper Extremity Assessment: LUE deficits/detail LUE Deficits /  Details: Grossly 3/5, poor coordination  LUE Sensation: decreased light touch LUE Coordination: decreased fine motor, decreased gross motor  Lower Extremity Assessment: Defer to PT evaluation LLE Sensation: decreased light touch    ADLs  Overall ADL's : Needs assistance/impaired Grooming: Minimal assistance, Sitting Upper Body Bathing: Moderate assistance, Sitting Lower Body Bathing: Maximal assistance, Sit to/from stand Upper Body Dressing : Moderate assistance, Sitting Lower Body Dressing: Maximal assistance, Sit to/from stand Toilet Transfer: Moderate assistance, +2 for physical assistance,  Ambulation Toilet Transfer Details (indicate cue type and reason): Simulated Toileting- Clothing Manipulation and Hygiene: Moderate assistance, Sit to/from stand Toileting - Clothing Manipulation Details (indicate cue type and reason): for peri care in standing Functional mobility during ADLs: Moderate assistance, +2 for physical assistance(2 person HHA)    Mobility  Overal bed mobility: Needs Assistance Bed Mobility: Supine to Sit Supine to sit: Min assist General bed mobility comments: pt with rail, HOB 20 degrees with increased time and cues for balance and safety as pt leaning left and needing assist to achieve full midline and cues to scoot to EOB    Transfers  Overall transfer level: Needs assistance Equipment used: 2 person hand held assist Transfers: Sit to/from Stand Sit to Stand: Min assist, +2 safety/equipment General transfer comment: cues for hand placement and safety with cues to achieve midline in standing due to maintain left lean in standing    Ambulation / Gait / Stairs / Wheelchair Mobility  Ambulation/Gait Ambulation/Gait assistance: Mod assist, +2 physical assistance, +2 safety/equipment Ambulation Distance (Feet): 55 Feet Assistive device: 2 person hand held assist Gait Pattern/deviations: Step-to pattern, Decreased stride length, Narrow base of support, Drifts  right/left General Gait Details: pt with maintained left lean with decreased use of LUE and mod assist for balance throughout +2 with cues for safety and midline throughout. Pt with short step length with decreased clearance and awareness of left foot placement with stepping and cues to sequence Gait velocity interpretation: Below normal speed for age/gender    Posture / Balance Balance Overall balance assessment: Needs assistance Sitting-balance support: Feet supported Sitting balance-Leahy Scale: Fair Standing balance support: Bilateral upper extremity supported Standing balance-Leahy Scale: Poor Standing balance comment: mod +2 assist for balance    Special needs/care consideration BiPAP/CPAP  N/a CPM  N/a Continuous Drip IV  N/a Dialysis  N/a Life Vest  N/a Oxygen  N/a Special Bed  N/a Trach Size  N/a Wound Vac n/a Skin intact Bowel mgmt: continent; constipation, LBM 02/17/17 Bladder mgmt:  continent Diabetic mgmt  N/a   Previous Home Environment Living Arrangements: Spouse/significant other  Lives With: Spouse Available Help at Discharge: (wife would take fmla) Type of Home: House Home Layout: One level Home Access: Stairs to enter Secretary/administrator of Steps: 1 Bathroom Shower/Tub: Hydrographic surveyor, Sport and exercise psychologist: Standard Bathroom Accessibility: Yes How Accessible: Accessible via walker Home Care Services: No  Discharge Living Setting Plans for Discharge Living Setting: Patient's home, Lives with (comment)(wife) Type of Home at Discharge: House Discharge Home Layout: One level Discharge Home Access: Stairs to enter Entrance Stairs-Rails: None Entrance Stairs-Number of Steps: 1 Discharge Bathroom Shower/Tub: Tub/shower unit Discharge Bathroom Toilet: Standard Does the patient have any problems obtaining your medications?: No  Social/Family/Support Systems Patient Roles: Spouse(employee) Contact Information: Vickie, wife Anticipated Caregiver:  wife Anticipated Industrial/product designer Information: see above Ability/Limitations of Caregiver: wife would take fmla if needed short term Caregiver Availability: Other (Comment) Discharge Plan Discussed with Primary Caregiver: Yes Is Caregiver In Agreement with Plan?: Yes Does Caregiver/Family have Issues with Lodging/Transportation while Pt is in Rehab?: No  Goals/Additional Needs Patient/Family Goal for Rehab: Mod I to supervision with PT, OT, and SLP Expected length of stay: ELOS 14-18 days Pt/Family Agrees to Admission and willing to participate: Yes Program Orientation Provided & Reviewed with Pt/Caregiver Including Roles  & Responsibilities: Yes  Decrease burden of Care through IP rehab admission: n/a  Possible need for SNF placement upon discharge:not anticipated  Patient Condition: This patient's medical and functional  status has changed since the consult dated: 02/13/2017 in which the Rehabilitation Physician determined and documented that the patient's condition is appropriate for intensive rehabilitative care in an inpatient rehabilitation facility. See "History of Present Illness" (above) for medical update. Functional changes are: mod assist. Patient's medical and functional status update has been discussed with the Rehabilitation physician and patient remains appropriate for inpatient rehabilitation. Will admit to inpatient rehab today.  Preadmission Screen Completed By:  Clois DupesBoyette, Ahmaud Duthie Godwin, 02/17/2017 11:22 AM ______________________________________________________________________   Discussed status with Dr. Riley KillSwartz on 02/17/2017 at  1122 and received telephone approval for admission today.  Admission Coordinator:  Clois DupesBoyette, Ardythe Klute Godwin, time [id number] Date 02/17/2017             Cosigned by: Ranelle OysterSwartz, Zachary T, MD at 02/17/2017 11:27

## 2017-02-17 NOTE — H&P (Signed)
Physical Medicine and Rehabilitation Admission H&P       Chief Complaint  Patient presents with  . Left sided weakness with numbness.     HPI:  Jesus Prout IIIis a 52 y.o.malewith history of HTN who was admitted on 02/12/17 with left facial and left sided weakness with numbness. CT head negative with question areas of low density right cerebellum--recent or old infarcts. He received tPA and follow  MRI brain done showing "small areas of acute/early subacute cortical infarction in right posterior insula and right lateral parietal lobeas well as very small right superior cerebellar subacute infarct". UDS negative and hypercoagulopathy panel positive for lupus anticoagulant--question to be repeated.   Dr. Roda Shutters recommended work up for embolic source and TEE was negative for PFO or thrombus. EF 60-65% with no wall abnormality and loop recorder placed 01/7. On ASA for embolic stroke due to unknown source.  CTA head/neck without large for medium vessel occlusion, no carotid stenosis and showed indistinct nodular shadow RUL--CT chest recommended at some point.  BLE dopplers negative for DVT. Therapy ongoing and patient with limited by left sided weakness with sensory deficits, mild dysarthria, higher level cognitive deficits, balance deficits. CIR recommended due to functional deficits.   Review of Systems  Constitutional: Negative for chills and fever.  HENT: Negative for hearing loss and tinnitus.   Respiratory: Negative for cough and shortness of breath.        Stuffy nose/PND.   Cardiovascular: Negative for chest pain and palpitations.  Gastrointestinal: Negative for constipation, heartburn and nausea.  Genitourinary: Negative for dysuria and urgency.  Musculoskeletal: Positive for myalgias (due to hospital bed).  Skin: Negative for itching and rash.  Neurological: Positive for sensory change (left side feels asleep), speech change and focal weakness (left side.). Negative for  dizziness and headaches.  Psychiatric/Behavioral: Negative for memory loss. The patient does not have insomnia.           Past Medical History:  Diagnosis Date  . Environmental allergies   . Fall from roof    with kidney trauma  . Hypertension 2018         Past Surgical History:  Procedure Laterality Date  . LOOP RECORDER INSERTION N/A 02/16/2017   Procedure: LOOP RECORDER INSERTION;  Surgeon: Regan Lemming, MD;  Location: [facility]ASIVE CV [facility];  Service: Cardiovascular;  Laterality: N/A;  . NO PAST SURGERIES    . TEE WITHOUT CARDIOVERSION N/A 02/16/2017   Procedure: TRANSESOPHAGEAL ECHOCARDIOGRAM (TEE);  Surgeon: Jake Bathe, MD;  Location: [facility] ENDOSCOPY;  Service: Cardiovascular;  Laterality: N/A;         Family History  Problem Relation Age of Onset  . Hypertension Mother   . Hypertension Father   . Stroke Father   . Hypertension Sister   . Leukemia Paternal Uncle     Social History:  Married. Wife supportive and can assist as needed. 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 Does not use tobacco products or illicit drugs. Uses alcohol on rare occasions.         Allergies  Allergen Reactions  . Penicillins Other (See Comments)    Headache Has patient had a PCN reaction causing immediate rash, facial/tongue/throat swelling, SOB or lightheadedness with hypotension: No Has patient had a PCN reaction causing severe rash involving mucus membranes or skin necrosis: No Has patient had a PCN reaction that required hospitalization: No Has patient had a PCN reaction occurring within the last 10 years:No If all of the above  answers are "NO", then may proceed with Cephalosporin use.           Medications Prior to Admission  Medication Sig Dispense Refill  . acetaminophen (TYLENOL) 325 MG tablet Take 650 mg by mouth every 6 (six) hours as needed for mild pain.    Marland Kitchen. amLODipine (NORVASC) 10 MG tablet Take 1 tablet (10 mg total) by mouth daily. 90  tablet 3  . cetirizine (ZYRTEC) 10 MG tablet Take 10 mg by mouth daily as needed for allergies.    Marland Kitchen. ibuprofen (ADVIL,MOTRIN) 200 MG tablet Take 200 mg by mouth every 6 (six) hours as needed for moderate pain.      Drug Regimen Review  Drug regimen was reviewed and remains appropriate with no significant issues identified  Home: Home Living Family/patient expects to be discharged to:: Private residence Living Arrangements: Spouse/significant other Available Help at Discharge: (wife would take fmla) Type of Home: House Home Access: Stairs to enter Secretary/administratorntrance Stairs-Number of Steps: 1 Home Layout: One level Bathroom Shower/Tub: Hydrographic surveyorTub/shower unit, Sport and exercise psychologistDoor Bathroom Toilet: Pharmacist, communitytandard Bathroom Accessibility: Yes Home Equipment: None  Lives With: Spouse   Functional History: Prior Function Level of Independence: Independent Comments: Works in Psychologist, clinicalsales for construction, works from home  Functional Status:  Mobility: Bed Mobility Overal bed mobility: Needs Assistance Bed Mobility: Supine to Sit Supine to sit: Min assist General bed mobility comments: Pt OOB in chair upon PT arrival. Transfers Overall transfer level: Needs assistance Equipment used: 1 person hand held assist Transfers: Sit to/from Stand Sit to Stand: Min assist, +2 safety/equipment General transfer comment: STS from chair x2. VCs for hand positioning and to shift weight-forward for standing/bottom back further for sitting. Pt in chair post-ambulation.  Ambulation/Gait Ambulation/Gait assistance: Min assist, +2 safety/equipment(chair follow) Ambulation Distance (Feet): 50 Feet(+50) Assistive device: 1 person hand held assist(hand held assist on left, railing on right) Gait Pattern/deviations: Step-through pattern, Decreased stride length, Trunk flexed, Narrow base of support General Gait Details: Pt demonstrated step-through pattern during gait today and was able to increase cadence when cued. Pt able to maintain  upright posture with VCs. Pt demonstrating left knee instability. Gait velocity: decreased Gait velocity interpretation: Below normal speed for age/gender  ADL: ADL Overall ADL's : Needs assistance/impaired Grooming: Minimal assistance, Sitting Upper Body Bathing: Moderate assistance, Sitting Lower Body Bathing: Maximal assistance, Sit to/from stand Upper Body Dressing : Moderate assistance, Sitting Lower Body Dressing: Maximal assistance, Sit to/from stand Toilet Transfer: Moderate assistance, +2 for physical assistance, Ambulation Toilet Transfer Details (indicate cue type and reason): Simulated Toileting- Clothing Manipulation and Hygiene: Moderate assistance, Sit to/from stand Toileting - Clothing Manipulation Details (indicate cue type and reason): for peri care in standing Functional mobility during ADLs: Moderate assistance, +2 for physical assistance(2 person HHA)  Cognition: Cognition Overall Cognitive Status: Within Functional Limits for tasks assessed Arousal/Alertness: Awake/alert Orientation Level: Oriented X4 Attention: Focused, Sustained, Selective, Alternating Focused Attention: Appears intact Sustained Attention: Appears intact Selective Attention: Appears intact Alternating Attention: Appears intact Memory: Impaired Memory Impairment: Storage deficit Problem Solving: Appears intact Executive Function: Reasoning, Self Monitoring Reasoning: Impaired Reasoning Impairment: Verbal complex, Functional complex Self Monitoring: Impaired Self Monitoring Impairment: Verbal complex, Functional complex Safety/Judgment: Impaired Cognition Arousal/Alertness: Awake/alert Behavior During Therapy: WFL for tasks assessed/performed Overall Cognitive Status: Within Functional Limits for tasks assessed Area of Impairment: Safety/judgement Safety/Judgement: Decreased awareness of deficits, Decreased awareness of safety General Comments: pt unaware of left lean in sitting or  standing   Blood pressure 137/83, pulse (!) 102, temperature  99.1 F (37.3 C), temperature source Oral, resp. rate 20, height 5\' 11"  (1.803 m), weight 128.2 kg (282 lb 10.1 oz), SpO2 94 %. Physical Exam  Nursing note and vitals reviewed. Constitutional: He is oriented to person, place, and time. He appears well-developed and well-nourished.  HENT:  Head: Normocephalic and atraumatic.  Mouth/Throat: Oropharynx is clear and moist.  Eyes: Conjunctivae are normal. Pupils are equal, round, and reactive to light.  Neck: Normal range of motion. Neck supple.  Cardiovascular: Normal rate and regular rhythm.  Respiratory: Breath sounds normal. No stridor. He is in respiratory distress.  GI: Soft. Bowel sounds are normal. He exhibits no distension. There is no tenderness.  Neurological: He is alert and oriented to person, place, and time. A cranial nerve deficit is present.  Left facial weakness with mild dysarthria. Question mild left inattention. Able to follow one and two step commands without difficulty. Left sided weakness with sensory deficits.   Skin: Skin is warm and dry.  Psychiatric: He has a normal mood and affect. His behavior is normal. Thought content normal.    LabResultsLast48Hours  No results found for this or any previous visit (from the past 48 hour(s)).   ImagingResults(Last48hours)  No results found.       Medical Problem List and Plan: 1.  Left hemiparesis and functional deficits secondary to right insular and cerebellar infarcts              -admit to inpatient rehab 2.  DVT Prophylaxis/Anticoagulation: Pharmaceutical: Lovenox 3. Pain Management: N/A 4. Mood: LCSW to follow for evaluation and support.  5. Neuropsych: This patient is capable of making decisions on his own behalf. 6. Skin/Wound Care: routine pressure relief measures 7. Fluids/Electrolytes/Nutrition: Intake poor due to food choices. Will discussed dietary restrictions with patient and  wife. Will have dietician educate on [facility] diet. Liberalize diet for now.  8. Dyslipidemia: now on Lipitor.  9. Seasonal allergies: Will resume Claritin (for zyrtec) and nose spray to help manage PND. 10. HTN: Monitor BP bid. Continue Norvasc.  11. Question RUL nodule: Needs follow up CT chest for work up in the future.  12. Obesity: Dietician to educate on appropriate diet/food choices. Encourage weight loss to help decrease risk factors.   Post Admission Physician Evaluation: 1. Functional deficits secondary  to embolic right CVA's. 2. Patient is admitted to receive collaborative, interdisciplinary care between the physiatrist, rehab nursing staff, and therapy team. 3. Patient's level of medical complexity and substantial therapy needs in context of that medical necessity cannot be provided at a lesser intensity of care such as a SNF. 4. Patient has experienced substantial functional loss from his/her baseline which was documented above under the "Functional History" and "Functional Status" headings.  Judging by the patient's diagnosis, physical exam, and functional history, the patient has potential for functional progress which will result in measurable gains while on inpatient rehab.  These gains will be of substantial and practical use upon discharge  in facilitating mobility and self-care at the household level. 5. Physiatrist will provide 24 hour management of medical needs as well as oversight of the therapy plan/treatment and provide guidance as appropriate regarding the interaction of the two. 6. The Preadmission Screening has been reviewed and patient status is unchanged unless otherwise stated above. 7. 24 hour rehab nursing will assist with bladder management, bowel management, safety, skin/wound care, disease management, medication administration, pain management and patient education  and help integrate therapy concepts, techniques,education, etc. 8. PT will assess and  treat for/with:  Lower extremity strength, range of motion, stamina, balance, functional mobility, safety, adaptive techniques and equipment, NMR, stroke education.   Goals are: mod I. 9. OT will assess and treat for/with: ADL's, functional mobility, safety, upper extremity strength, adaptive techniques and equipment, NMR, stroke education.   Goals are: mod I. Therapy may proceed with showering this patient. 10. SLP will assess and treat for/with: speech, communication.  Goals are: mod I. 11. Case Management and Social Worker will assess and treat for psychological issues and discharge planning. 12. Team conference will be held weekly to assess progress toward goals and to determine barriers to discharge. 13. Patient will receive at least 3 hours of therapy per day at least 5 days per week. 14. ELOS: 8-12 days       15. Prognosis:  excellent     Ranelle Oyster, MD, [facility] Physical Medicine & Rehabilitation 02/17/2017  Jacquelynn Cree, PA-C 02/17/2017

## 2017-02-17 NOTE — H&P (Signed)
Physical Medicine and Rehabilitation Admission H&P    Chief Complaint  Patient presents with  . Left sided weakness with numbness.     HPI:  [name] is a 52 y.o. male with history of HTN who was admitted on 02/12/17 with left facial and left sided weakness with numbness. CT head negative with question areas of low density right cerebellum--recent or old infarcts. He received tPA and follow  MRI brain done showing "small areas of acute/early subacute cortical infarction in right posterior insula and right lateral parietal lobe as well as very small right superior cerebellar subacute infarct". UDS negative and hypercoagulopathy panel positive for lupus anticoagulant--question to be repeated.   Dr. Roda Shutters recommended work up for embolic source and TEE was negative for PFO or thrombus. EF 60-65% with no wall abnormality and loop recorder placed 01/7. On ASA for embolic stroke due to unknown source.  CTA head/neck without large for medium vessel occlusion, no carotid stenosis and showed indistinct nodular shadow RUL--CT chest recommended at some point.  BLE dopplers negative for DVT. Therapy ongoing and patient with limited by left sided weakness with sensory deficits, mild dysarthria, higher level cognitive deficits, balance deficits. CIR recommended due to functional deficits.   Review of Systems  Constitutional: Negative for chills and fever.  HENT: Negative for hearing loss and tinnitus.   Respiratory: Negative for cough and shortness of breath.        Stuffy nose/PND.   Cardiovascular: Negative for chest pain and palpitations.  Gastrointestinal: Negative for constipation, heartburn and nausea.  Genitourinary: Negative for dysuria and urgency.  Musculoskeletal: Positive for myalgias (due to hospital bed).  Skin: Negative for itching and rash.  Neurological: Positive for sensory change (left side feels asleep), speech change and focal weakness (left side.). Negative for dizziness and  headaches.  Psychiatric/Behavioral: Negative for memory loss. The patient does not have insomnia.       Past Medical History:  Diagnosis Date  . Environmental allergies   . Fall from roof    with kidney trauma  . Hypertension 2018    Past Surgical History:  Procedure Laterality Date  . LOOP RECORDER INSERTION N/A 02/16/2017   Procedure: LOOP RECORDER INSERTION;  Surgeon: Regan Lemming, MD;  Location: [facility] INVASIVE CV LAB;  Service: Cardiovascular;  Laterality: N/A;  . NO PAST SURGERIES    . TEE WITHOUT CARDIOVERSION N/A 02/16/2017   Procedure: TRANSESOPHAGEAL ECHOCARDIOGRAM (TEE);  Surgeon: Jake Bathe, MD;  Location[facility] ENDOSCOPY;  Service: Cardiovascular;  Laterality: N/A;    Family History  Problem Relation Age of Onset  . Hypertension Mother   . Hypertension Father   . Stroke Father   . Hypertension Sister   . Leukemia Paternal Uncle     Social History:  Married. Wife supportive and can assist as needed. 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 Does not use tobacco products or illicit drugs. Uses alcohol on rare occasions.    Allergies  Allergen Reactions  . Penicillins Other (See Comments)    Headache Has patient had a PCN reaction causing immediate rash, facial/tongue/throat swelling, SOB or lightheadedness with hypotension: No Has patient had a PCN reaction causing severe rash involving mucus membranes or skin necrosis: No Has patient had a PCN reaction that required hospitalization: No Has patient had a PCN reaction occurring within the last 10 years:No If all of the above answers are "NO", then may proceed with Cephalosporin use.     Medications Prior to Admission  Medication Sig Dispense Refill  . acetaminophen (TYLENOL) 325 MG tablet Take 650 mg by mouth every 6 (six) hours as needed for mild pain.    Marland Kitchen. amLODipine (NORVASC) 10 MG tablet Take 1 tablet (10 mg total) by mouth daily. 90 tablet 3  . cetirizine (ZYRTEC) 10 MG tablet Take 10 mg by mouth daily as  needed for allergies.    Marland Kitchen. ibuprofen (ADVIL,MOTRIN) 200 MG tablet Take 200 mg by mouth every 6 (six) hours as needed for moderate pain.      Drug Regimen Review  Drug regimen was reviewed and remains appropriate with no significant issues identified  Home: Home Living Family/patient expects to be discharged to:: Private residence Living Arrangements: Spouse/significant other Available Help at Discharge: (wife would take fmla) Type of Home: House Home Access: Stairs to enter Secretary/administratorntrance Stairs-Number of Steps: 1 Home Layout: One level Bathroom Shower/Tub: Hydrographic surveyorTub/shower unit, Sport and exercise psychologistDoor Bathroom Toilet: Pharmacist, communitytandard Bathroom Accessibility: Yes Home Equipment: None  Lives With: Spouse   Functional History: Prior Function Level of Independence: Independent Comments: Works in Psychologist, clinicalsales for construction, works from home  Functional Status:  Mobility: Bed Mobility Overal bed mobility: Needs Assistance Bed Mobility: Supine to Sit Supine to sit: Min assist General bed mobility comments: Pt OOB in chair upon PT arrival. Transfers Overall transfer level: Needs assistance Equipment used: 1 person hand held assist Transfers: Sit to/from Stand Sit to Stand: Min assist, +2 safety/equipment General transfer comment: STS from chair x2. VCs for hand positioning and to shift weight-forward for standing/bottom back further for sitting. Pt in chair post-ambulation.  Ambulation/Gait Ambulation/Gait assistance: Min assist, +2 safety/equipment(chair follow) Ambulation Distance (Feet): 50 Feet(+50) Assistive device: 1 person hand held assist(hand held assist on left, railing on right) Gait Pattern/deviations: Step-through pattern, Decreased stride length, Trunk flexed, Narrow base of support General Gait Details: Pt demonstrated step-through pattern during gait today and was able to increase cadence when cued. Pt able to maintain upright posture with VCs. Pt demonstrating left knee instability. Gait velocity:  decreased Gait velocity interpretation: Below normal speed for age/gender    ADL: ADL Overall ADL's : Needs assistance/impaired Grooming: Minimal assistance, Sitting Upper Body Bathing: Moderate assistance, Sitting Lower Body Bathing: Maximal assistance, Sit to/from stand Upper Body Dressing : Moderate assistance, Sitting Lower Body Dressing: Maximal assistance, Sit to/from stand Toilet Transfer: Moderate assistance, +2 for physical assistance, Ambulation Toilet Transfer Details (indicate cue type and reason): Simulated Toileting- Clothing Manipulation and Hygiene: Moderate assistance, Sit to/from stand Toileting - Clothing Manipulation Details (indicate cue type and reason): for peri care in standing Functional mobility during ADLs: Moderate assistance, +2 for physical assistance(2 person HHA)  Cognition: Cognition Overall Cognitive Status: Within Functional Limits for tasks assessed Arousal/Alertness: Awake/alert Orientation Level: Oriented X4 Attention: Focused, Sustained, Selective, Alternating Focused Attention: Appears intact Sustained Attention: Appears intact Selective Attention: Appears intact Alternating Attention: Appears intact Memory: Impaired Memory Impairment: Storage deficit Problem Solving: Appears intact Executive Function: Reasoning, Self Monitoring Reasoning: Impaired Reasoning Impairment: Verbal complex, Functional complex Self Monitoring: Impaired Self Monitoring Impairment: Verbal complex, Functional complex Safety/Judgment: Impaired Cognition Arousal/Alertness: Awake/alert Behavior During Therapy: WFL for tasks assessed/performed Overall Cognitive Status: Within Functional Limits for tasks assessed Area of Impairment: Safety/judgement Safety/Judgement: Decreased awareness of deficits, Decreased awareness of safety General Comments: pt unaware of left lean in sitting or standing   Blood pressure 137/83, pulse (!) 102, temperature 99.1 F (37.3 C),  temperature source Oral, resp. rate 20, height 5\' 11"  (1.803 m), weight 128.2 kg (282 lb 10.1 oz),  SpO2 94 %. Physical Exam  Nursing note and vitals reviewed. Constitutional: He is oriented to person, place, and time. He appears well-developed and well-nourished.  HENT:  Head: Normocephalic and atraumatic.  Mouth/Throat: Oropharynx is clear and moist.  Eyes: Conjunctivae are normal. Pupils are equal, round, and reactive to light.  Neck: Normal range of motion. Neck supple.  Cardiovascular: Normal rate and regular rhythm.  Respiratory: Breath sounds normal. No stridor. He is in respiratory distress.  GI: Soft. Bowel sounds are normal. He exhibits no distension. There is no tenderness.  Neurological: He is alert and oriented to person, place, and time. A cranial nerve deficit is present.  Left facial weakness with mild dysarthria. Question mild left inattention. Able to follow one and two step commands without difficulty. Left sided weakness with sensory deficits.   Skin: Skin is warm and dry.  Psychiatric: He has a normal mood and affect. His behavior is normal. Thought content normal.    No results found for this or any previous visit (from the past 48 hour(s)). No results found.     Medical Problem List and Plan: 1.  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  DVT Prophylaxis/Anticoagulation: Pharmaceutical: Lovenox 3. Pain Management: N/A 4. Mood: LCSW to follow for evaluation and support.  5. Neuropsych: This patient is capable of making decisions on his own behalf. 6. Skin/Wound Care: routine pressure relief measures 7. Fluids/Electrolytes/Nutrition: Intake poor due to food choices. Will discussed dietary restrictions with patient and wife. Will have dietician educate on [facility] diet. Liberalize diet for now.  8. Dyslipidemia: now on Lipitor.  9. Seasonal allergies: Will resume Claritin (for zyrtec) and  nose spray to help manage PND. 10. HTN: Monitor BP bid. Continue Norvasc.  11. Question RUL nodule: Needs follow up CT chest for work up in the future.  12. Obesity: Dietician to educate on appropriate diet/food choices. Encourage weight loss to help decrease risk factors.   Post Admission Physician Evaluation: 1. Functional deficits secondary  to embolic right CVA's. 2. Patient is admitted to receive collaborative, interdisciplinary care between the physiatrist, rehab nursing staff, and therapy team. 3. Patient's level of medical complexity and substantial therapy needs in context of that medical necessity cannot be provided at a lesser intensity of care such as a SNF. 4. Patient has experienced substantial functional loss from his/her baseline which was documented above under the "Functional History" and "Functional Status" headings.  Judging by the patient's diagnosis, physical exam, and functional history, the patient has potential for functional progress which will result in measurable gains while on inpatient rehab.  These gains will be of substantial and practical use upon discharge  in facilitating mobility and self-care at the household level. 5. Physiatrist will provide 24 hour management of medical needs as well as oversight of the therapy plan/treatment and provide guidance as appropriate regarding the interaction of the two. 6. The Preadmission Screening has been reviewed and patient status is unchanged unless otherwise stated above. 7. 24 hour rehab nursing will assist with bladder management, bowel management, safety, skin/wound care, disease management, medication administration, pain management and patient education  and help integrate therapy concepts, techniques,education, etc. 8. PT will assess and treat for/with: Lower extremity strength, range of motion, stamina, balance, functional mobility, safety, adaptive techniques and equipment, NMR, stroke education.   Goals are: mod  I. 9. OT will assess and treat for/with: ADL's, functional mobility, safety, upper extremity strength,  adaptive techniques and equipment, NMR, stroke education.   Goals are: mod I. Therapy may proceed with showering this patient. 10. SLP will assess and treat for/with: speech, communication.  Goals are: mod I. 11. Case Management and Social Worker will assess and treat for psychological issues and discharge planning. 12. Team conference will be held weekly to assess progress toward goals and to determine barriers to discharge. 13. Patient will receive at least 3 hours of therapy per day at least 5 days per week. 14. ELOS: 8-12 days       15. Prognosis:  excellent     Ranelle Oyster, MD, [facility] Physical Medicine & Rehabilitation 02/17/2017  Jacquelynn Cree, PA-C 02/17/2017

## 2017-02-17 NOTE — Discharge Summary (Signed)
Stroke Discharge Summary  Patient ID: [name]   MRN: [id number]      DOB: 1965-08-12  Date of Admission: 02/12/2017 Date of Discharge: 02/17/2017  Attending Physician:  Micki Riley, MD, Stroke MD Consultant(s):   cardiology and rehabilitation medicine Patient's PCP:  Porfirio Oar, PA-C  Discharge Diagnoses:  Active Problems:   Cryptogenic stroke (HCC) Hypertension Hyperlipidemia  Past Medical History:  Diagnosis Date  . Fall from roof    with kidney trauma  . Hypertension 2018   Past Surgical History:  Procedure Laterality Date  . LOOP RECORDER INSERTION N/A 02/16/2017   Procedure: LOOP RECORDER INSERTION;  Surgeon: Regan Lemming, MD;  Location: [facility];  Service: Cardiovascular;  Laterality: N/A;  . NO PAST SURGERIES    . TEE WITHOUT CARDIOVERSION N/A 02/16/2017   Procedure: TRANSESOPHAGEAL ECHOCARDIOGRAM (TEE);  Surgeon: Jake Bathe, MD;  Location: [facility];  Service: Cardiovascular;  Laterality: N/A;   Medications to be continued on Rehab . amLODipine  10 mg Oral Daily  . aspirin EC  325 mg Oral Daily  . atorvastatin  40 mg Oral q1800  . docusate sodium  200 mg Oral QHS  . enoxaparin (LOVENOX) injection  40 mg Subcutaneous Q24H  . lisinopril  20 mg Oral Daily  . pantoprazole  40 mg Oral Daily   LABORATORY STUDIES CBC    Component Value Date/Time   WBC 9.8 02/15/2017 0328   RBC 4.94 02/15/2017 0328   HGB 15.3 02/15/2017 0328   HGB 17.3 11/26/2016 1040   HCT 44.7 02/15/2017 0328   HCT 51.6 (H) 11/26/2016 1040   PLT 267 02/15/2017 0328   PLT 276 11/26/2016 1040   MCV 90.5 02/15/2017 0328   MCV 92 11/26/2016 1040   MCH 31.0 02/15/2017 0328   MCHC 34.2 02/15/2017 0328   RDW 12.7 02/15/2017 0328   RDW 13.0 11/26/2016 1040   LYMPHSABS 2.7 02/12/2017 1019   LYMPHSABS 2.7 11/26/2016 1040   MONOABS 0.7 02/12/2017 1019   EOSABS 0.1 02/12/2017 1019   EOSABS 0.2 11/26/2016 1040   BASOSABS 0.0 02/12/2017 1019   BASOSABS 0.0  11/26/2016 1040   CMP    Component Value Date/Time   NA 137 02/15/2017 0328   NA 140 11/26/2016 1040   K 3.8 02/15/2017 0328   CL 107 02/15/2017 0328   CO2 23 02/15/2017 0328   GLUCOSE 96 02/15/2017 0328   BUN 15 02/15/2017 0328   BUN 18 11/26/2016 1040   CREATININE 1.16 02/15/2017 0328   CALCIUM 9.3 02/15/2017 0328   PROT 7.2 02/12/2017 1019   PROT 7.7 11/26/2016 1040   ALBUMIN 3.9 02/12/2017 1019   ALBUMIN 4.6 11/26/2016 1040   AST 27 02/12/2017 1019   ALT 34 02/12/2017 1019   ALKPHOS 85 02/12/2017 1019   BILITOT 1.3 (H) 02/12/2017 1019   BILITOT 1.0 11/26/2016 1040   GFRNONAA >60 02/15/2017 0328   GFRAA >60 02/15/2017 0328   COAGS Lab Results  Component Value Date   INR 1.01 02/12/2017   Lipid Panel    Component Value Date/Time   CHOL 185 02/13/2017 0753   CHOL 205 (H) 11/26/2016 1040   TRIG 98 02/13/2017 0753   HDL 28 (L) 02/13/2017 0753   HDL 34 (L) 11/26/2016 1040   CHOLHDL 6.6 02/13/2017 0753   VLDL 20 02/13/2017 0753   LDLCALC 137 (H) 02/13/2017 0753   LDLCALC 143 (H) 11/26/2016 1040   HgbA1C  Lab Results  Component  Value Date   HGBA1C 5.5 02/13/2017   Urinalysis    Component Value Date/Time   COLORURINE YELLOW 02/12/2017 1250   APPEARANCEUR CLEAR 02/12/2017 1250   APPEARANCEUR Clear 11/26/2016 1040   LABSPEC 1.034 (H) 02/12/2017 1250   PHURINE 5.0 02/12/2017 1250   GLUCOSEU NEGATIVE 02/12/2017 1250   HGBUR MODERATE (A) 02/12/2017 1250   BILIRUBINUR NEGATIVE 02/12/2017 1250   BILIRUBINUR Negative 11/26/2016 1040   KETONESUR NEGATIVE 02/12/2017 1250   PROTEINUR NEGATIVE 02/12/2017 1250   NITRITE NEGATIVE 02/12/2017 1250   LEUKOCYTESUR NEGATIVE 02/12/2017 1250   LEUKOCYTESUR Negative 11/26/2016 1040   Urine Drug Screen     Component Value Date/Time   LABOPIA NONE DETECTED 02/12/2017 1250   COCAINSCRNUR NONE DETECTED 02/12/2017 1250   LABBENZ NONE DETECTED 02/12/2017 1250   AMPHETMU NONE DETECTED 02/12/2017 1250   THCU NONE DETECTED  02/12/2017 1250   LABBARB NONE DETECTED 02/12/2017 1250    Alcohol Level    Component Value Date/Time   ETH <10 02/12/2017 1019   SIGNIFICANT DIAGNOSTIC STUDIES Ct Angio Head W Or Wo Contrast Ct Angio Neck W Or Wo Contrast 02/12/2017 IMPRESSION:  Negative for large or medium vessel occlusion. No sign of atherosclerotic vascular disease, dissection or other focal vascular finding.  Incidental detection of indistinct nodular shadows in the right upper lobe. These could represent inflammatory foci or nodules. Complete chest CT recommended at some point.   Ct Head Wo Contrast 02/13/2017 IMPRESSION:  No evidence of acute intracranial hemorrhage.  Small linear areas of acute/ subacute infarction in the right insula and right parietal lobe are not demonstrated by CT.   Mr Brain Wo Contrast 02/12/2017 IMPRESSION:  1. Small areas of acute/early subacute cortical infarction in right posterior insula and right lateral parietal lobe. No hemorrhage or mass effect.  2. Very small right superior cerebellar hemisphere subacute infarct.   Ct Head Code Stroke Wo Contrast 02/12/2017  IMPRESSION:  1. No abnormal supratentorial finding. No cause of left-sided numbness and weakness is identified.  2. ASPECTS is 10. Question areas of low-density in the right cerebellum that could be recent or old infarctions.   B/L LE Doppler - negative for DVT  TCD bubble study  02/13/2017 FINAL INTERPRETATION A vascular evaluation was performed. The left middle cerebral artery was studied. An IV was inserted into the patients' left Forearm using aseptic precautions. Verbal informed consent was obtained. There is no evidence of high intensity transient signals (HITS) at rest or with valsalva. Therefore, there is no obvious evidence of patent foramen ovale (PFO). *See table(s) above for measurements and observations.  TTE  02/13/2017 Study Conclusions - Left ventricle: The cavity size was normal. There was  mild concentric hypertrophy. Systolic function was vigorous. The estimated ejection fraction was in the range of 65% to 70%. Wall motion was normal; there were no regional wall motion abnormalities. Doppler parameters are consistent with abnormal left ventricular relaxation (grade 1 diastolic dysfunction). - Aortic valve: There was mild regurgitation. - Aorta: Aortic root dimension: 39 mm (ED). - Ascending aorta: The ascending aorta was mildly dilated. Impressions: - No cardiac source of emboli was indentified.  TEE 02/16/2017 Study Conclusions - Left ventricle: Systolic function was normal. The estimated ejection fraction was in the range of 60% to 65%. Wall motion was normal; there were no regional wall motion abnormalities. - Aortic valve: No evidence of vegetation. There was mild regurgitation. - Mitral valve: No evidence of vegetation. - Left atrium: No evidence of thrombus in the atrial cavity  or appendage. No evidence of thrombus in the appendage. - Right atrium: No evidence of thrombus in the atrial cavity or appendage. No evidence of thrombus in the atrial cavity or appendage. - Atrial septum: No defect or patent foramen ovale was identified. Echo contrast study showed no right-to-left atrial level shunt, following an increase in RA pressure induced by provocative maneuvers. - Tricuspid valve: No evidence of vegetation. - Pulmonic valve: No evidence of vegetation. - Superior vena cava: The study excluded a thrombus. Impressions:- No cardiac source of emboli was indentified.    HISTORY OF PRESENT ILLNESS   &   HOSPITAL COURSE ASSESSMENT/PLAN Mr. [name] is a 52 y.o. male with history of HTN, obesity admitted for left sided weakness, numbness, slurry speech. TPA given    Stroke:  right MCA including right insular cortex and right parietal cortex small infarcts as well as right punctate cerebellar infarct, embolic pattern, unclear  source  Resultant left mild hemiparesis, left facial droop  MRI right MCA including right insular cortex and right parietal cortex small infarcts as well as right punctate cerebellar infarct  CTA negative except RUL lung nodule  2D Echo - No cardiac source of emboli was indentified.  TCD bubble study - no obvious evidence of patent foramen ovale (PFO)  LE venous doppler Negative  TEE/Loop 02/16/2017.  Hypercoagulable work up pending positive lupus anticoagulant bu tno h/o DVT /PE and LE venous dopplers negative hence recommend repeat in 6-8 weeks  LDL 137  HgbA1c 5.5  lovenox for VTE prophylaxis  Diet NPO time specified Except for: Sips with Meds   No antithrombotic prior to admission, now on aspirin 325 mg daily.   Patient counseled to be compliant with his antithrombotic medications  Ongoing aggressive stroke risk factor management  Therapy recommendations: CIR   Disposition:  CIR  Hypertension  Stable  Permissive hypertension (OK if <180/105) for 24-48 hours post stroke and then gradually normalized within 5-7 days.  Long term BP goal normotensive, Norvasc restarted  Hyperlipidemia  Home meds:  none,    LDL 137, goal < 70  Now on lipitor 40  Continue statin at discharge  Other Stroke Risk Factors  ETOH use, educated to limit to less than 2 drinks per day  Obesity, Body mass index is 39.42 kg/m.   Other Active Problems  The patient needs an outpatient chest CT to evaluate right upper lobe pulmonary nodules.  Patient will likely need an outpatient sleep study for OSA  Assessment Plan  CIR discharge today  Research nurse to discuss [facility] Stroke Prevention Study  DISCHARGE EXAM Blood pressure 137/83, pulse (!) 102, temperature 99.1 F (37.3 C), temperature source Oral, resp. rate 20, height 5\' 11"  (1.803 m), weight 128.2 kg (282 lb 10.1 oz), SpO2 94 %. General - Well nourished, well developed, in no apparent  distress.  Ophthalmologic - fundi not visualized due to noncooperation.  Cardiovascular - Regular rate and rhythm with no murmur.  Mental Status -  Level of arousal and orientation to time, place, and person were intact. Language including expression, naming, repetition, comprehension was assessed and found intact. Fund of Knowledge was assessed and was intact.  Cranial Nerves II - XII - II - Visual field intact OU. Li, IV, VI - Extraocular movements intact. V - Facial sensation intact bilaterally. VII - left facial droop. VIII - Hearing & vestibular intact bilaterally. X - Palate elevates symmetrically, mild dysarthria. XI - Chin turning & shoulder shrug intact bilaterally. XII -  Tongue protrusion to the left  Motor Strength - The patient's strength was 5/5 RUE and RLE, 4/5 LUE and 5-/5 LLE. No pronator drift seen.  Bulk was normal and fasciculations were absent.   Motor Tone - Muscle tone was assessed at the neck and appendages and was normal. Reflexes - The patient's reflexes were symmetrical in all extremities and he had no pathological reflexes. Sensory - Light touch, temperature/pinprick were assessed and were symmetrical.   Coordination - The patient had mild dysmetria on left FTN proportional to the weakness.  Tremor was absent. Gait and Station - deferred.  Discharge Diet  Diet Heart Room service appropriate? Yes; Fluid consistency: Thin liquids  DISCHARGE PLAN  Dis[facility]:  [facility] PT, OT and ST  aspirin 325 mg daily for secondary stroke prevention.  Recommend ongoing risk factor control by Primary Care Physician at time of discharge from inpatient rehabilitation.  Follow hypercoagulable panel and repeat lupus anticoagulant in 6-8 weeks  Follow-up Jeffery, Chelle, PA-C in 2 weeks following discharge from rehab.  Follow-up with Dr. Delia HeadyPramod Sethi, Stroke Clinic in 6 weeks, office to schedule an appointment.  Follow  up on hypercoagulable work up at outpatient Neurology appointment   Greater than 40minutes were spent preparing discharge.  Beryl MeagerMary A Costello, ANP-C 02/17/2017 11:20 AM I have personally examined this patient, reviewed notes, independently viewed imaging studies, participated in medical decision making and plan of care.ROS completed by me personally and pertinent positives fully documented  I have made any additions or clarifications directly to the above note. Agree with note above.   Delia HeadyPramod Sethi, MD Medical Director [facility] Pager: [phone] 02/17/2017 2:05 PM

## 2017-02-17 NOTE — Progress Notes (Signed)
Physical Therapy Treatment Patient Details Name: Jesus IlesChester Megill Li MRN: [id number] DOB: 28-Jul-1965 Today's Date: 02/17/2017    History of Present Illness 52 y.o. male with PMH of HTN, obesity admitted with sudden onset weakness and numbness in left side. MRI=right posterior insula and right lateral parietal lobe infarct with small right cerebellar infarct s/p tPA    PT Comments    Pt progresses towards PT goals today, ambulating 50-ft x2 with RUE support of wall railing, left UE with hand-held assist, and min guard assist. Pt does well with VCs and education. Pt able to stand statically for ~1 minute today with min guard only. Pt struggles to find "center" but is able to maintain standing without LOB. Current discharge plan remains appropriate. PT will follow acutely in order to ensure safe mobility while in hospital setting.    Follow Up Recommendations  CIR;Supervision/Assistance - 24 hour     Equipment Recommendations  Other (comment)(TBD at next venue of care)    Recommendations for Other Services       Precautions / Restrictions Precautions Precautions: Fall Restrictions Weight Bearing Restrictions: No    Mobility  Bed Mobility               General bed mobility comments: Pt OOB in chair upon PT arrival.  Transfers Overall transfer level: Needs assistance Equipment used: 1 person hand held assist Transfers: Sit to/from Stand Sit to Stand: Min assist;+2 safety/equipment         General transfer comment: STS from chair x2. VCs for hand positioning and to shift weight-forward for standing/bottom back further for sitting. Pt in chair post-ambulation.   Ambulation/Gait Ambulation/Gait assistance: Min assist;+2 safety/equipment(chair follow) Ambulation Distance (Feet): 50 Feet(+50) Assistive device: 1 person hand held assist(hand held assist on left, railing on right) Gait Pattern/deviations: Step-through pattern;Decreased stride length;Trunk flexed;Narrow base of  support Gait velocity: decreased Gait velocity interpretation: Below normal speed for age/gender General Gait Details: Pt demonstrated step-through pattern during gait today and was able to increase cadence when cued. Pt able to maintain upright posture with VCs. Pt demonstrating left knee instability.   Stairs            Wheelchair Mobility    Modified Rankin (Stroke Patients Only) Modified Rankin (Stroke Patients Only) Pre-Morbid Rankin Score: No symptoms Modified Rankin: Moderately severe disability     Balance Overall balance assessment: Needs assistance Sitting-balance support: Feet supported;No upper extremity supported Sitting balance-Leahy Scale: Fair Sitting balance - Comments: pt demonstrating sitting upright in chair with BUEs at 90 degrees flexion and ability to hold trunk upright.    Standing balance support: Bilateral upper extremity supported;During functional activity Standing balance-Leahy Scale: Poor Standing balance comment: Pt requiring min assist +1 to maintain static standing balance in general, but did demonstrate ability to stand satically with min guard only for ~ 1 minute during today's session. pt demonstrating dynamic standing balance (marching) with min guard assist.                             Cognition Arousal/Alertness: Awake/alert Behavior During Therapy: WFL for tasks assessed/performed Overall Cognitive Status: Within Functional Limits for tasks assessed                                        Exercises Other Exercises Other Exercises: standing marching x2 each leg with min assist  from PT.    General Comments General comments (skin integrity, edema, etc.): Pt's wife present throughout PT session and aids with chair follow.       Pertinent Vitals/Pain Pain Assessment: No/denies pain    Home Living                      Prior Function            PT Goals (current goals can now be found in the  care plan section) Progress towards PT goals: Progressing toward goals    Frequency    Min 4X/week      PT Plan      Co-evaluation              AM-PAC PT "6 Clicks" Daily Activity  Outcome Measure  Difficulty turning over in bed (including adjusting bedclothes, sheets and blankets)?: A Little Difficulty moving from lying on back to sitting on the side of the bed? : Unable Difficulty sitting down on and standing up from a chair with arms (e.g., wheelchair, bedside commode, etc,.)?: Unable Help needed moving to and from a bed to chair (including a wheelchair)?: A Little Help needed walking in hospital room?: A Little Help needed climbing 3-5 steps with a railing? : Total 6 Click Score: 12    End of Session Equipment Utilized During Treatment: Gait belt Activity Tolerance: Patient tolerated treatment well Patient left: in chair;with call bell/phone within reach;with family/visitor present   PT Visit Diagnosis: Other abnormalities of gait and mobility (R26.89);Unsteadiness on feet (R26.81);Other symptoms and signs involving the nervous system (R29.898)     Time: 1610-9604 PT Time Calculation (min) (ACUTE ONLY): 25 min  Charges:  $Gait Training: 8-22 mins $Neuromuscular Re-education: 8-22 mins                    G Codes:       Reina Fuse, SPT   Reina Fuse 02/17/2017, 11:45 AM

## 2017-02-17 NOTE — Progress Notes (Signed)
Patient is discharge to [facility] at this time. All belongings sent with patient. Family aware of disposition.  Sim BoastHavy, RN

## 2017-02-17 NOTE — Progress Notes (Signed)
Physical Medicine and Rehabilitation Consult   Reason for Consult:  Stroke with functional deficits Referring Physician:  Dr. Roda Shutters   HPI: [name] is a 52 y.o. male with history of HTN who was admitted on 02/12/17 with left facial and left sided weakness with numbness. MRI brain done showing "small areas of acute/early subacute cortical infarction in right posterior insula and right lateral parietal lobe as well as very small right superior cerebellar subacute infarct". UDS negative and work up underway.  Dr. Roda Shutters recommended work up for embolic source and TEE planned for Monday.  Therapy evaluations done today showing deficits in balance, mobility and ADLs. Functional deficits.    Review of Systems  Constitutional: Negative for fever.  HENT: Negative for hearing loss and tinnitus.   Eyes: Negative for blurred vision and double vision.  Respiratory: Negative for cough and hemoptysis.   Cardiovascular: Negative for chest pain and leg swelling.  Gastrointestinal: Negative for heartburn and nausea.  Genitourinary: Negative for dysuria and urgency.  Musculoskeletal: Negative for back pain and myalgias.  Neurological: Positive for sensory change, speech change and focal weakness. Negative for dizziness, weakness and headaches.  Psychiatric/Behavioral: The patient is not nervous/anxious and does not have insomnia.           Past Medical History:  Diagnosis Date  . Hypertension 2018         Past Surgical History:  Procedure Laterality Date  . NO PAST SURGERIES           Family History  Problem Relation Age of Onset  . Hypertension Mother   . Hypertension Father   . Stroke Father   . Hypertension Sister     Social History:  reports that  has never smoked. he has never used smokeless tobacco. He reports that he drinks alcohol. He reports that he does not use drugs.        Allergies  Allergen Reactions  . Penicillins Other (See  Comments)    Headache Has patient had a PCN reaction causing immediate rash, facial/tongue/throat swelling, SOB or lightheadedness with hypotension: No Has patient had a PCN reaction causing severe rash involving mucus membranes or skin necrosis: No Has patient had a PCN reaction that required hospitalization: No Has patient had a PCN reaction occurring within the last 10 years:No If all of the above answers are "NO", then may proceed with Cephalosporin use.           Medications Prior to Admission  Medication Sig Dispense Refill  . acetaminophen (TYLENOL) 325 MG tablet Take 650 mg by mouth every 6 (six) hours as needed for mild pain.    Marland Kitchen amLODipine (NORVASC) 10 MG tablet Take 1 tablet (10 mg total) by mouth daily. 90 tablet 3  . cetirizine (ZYRTEC) 10 MG tablet Take 10 mg by mouth daily as needed for allergies.    Marland Kitchen ibuprofen (ADVIL,MOTRIN) 200 MG tablet Take 200 mg by mouth every 6 (six) hours as needed for moderate pain.      Home: Home Living Family/patient expects to be discharged to:: Private residence Living Arrangements: Spouse/significant other Available Help at Discharge: Family, Available 24 hours/day(if needed, wife works typically) Type of Home: House Home Access: Stairs to enter Secretary/administrator of Steps: 1 Home Layout: One level Bathroom Shower/Tub: Hydrographic surveyor, Sport and exercise psychologist: Standard Home Equipment: None  Functional History: Prior Function Level of Independence: Independent Comments: Works in Psychologist, clinical, works from home Functional Status:  Mobility: Bed  Mobility Overal bed mobility: Needs Assistance Bed Mobility: Supine to Sit Supine to sit: Min assist General bed mobility comments: pt with rail, HOB 20 degrees with increased time and cues for balance and safety as pt leaning left and needing assist to achieve full midline and cues to scoot to EOB Transfers Overall transfer level: Needs assistance Equipment used:  2 person hand held assist Transfers: Sit to/from Stand Sit to Stand: Min assist, +2 safety/equipment General transfer comment: cues for hand placement and safety with cues to achieve midline in standing due to maintain left lean in standing Ambulation/Gait Ambulation/Gait assistance: Mod assist, +2 physical assistance, +2 safety/equipment Ambulation Distance (Feet): 55 Feet Assistive device: 2 person hand held assist Gait Pattern/deviations: Step-to pattern, Decreased stride length, Narrow base of support, Drifts right/left General Gait Details: pt with maintained left lean with decreased use of LUE and mod assist for balance throughout +2 with cues for safety and midline throughout. Pt with short step length with decreased clearance and awareness of left foot placement with stepping and cues to sequence Gait velocity interpretation: Below normal speed for age/gender  ADL: ADL Overall ADL's : Needs assistance/impaired Grooming: Minimal assistance, Sitting Upper Body Bathing: Moderate assistance, Sitting Lower Body Bathing: Maximal assistance, Sit to/from stand Upper Body Dressing : Moderate assistance, Sitting Lower Body Dressing: Maximal assistance, Sit to/from stand Toilet Transfer: Moderate assistance, +2 for physical assistance, Ambulation Toilet Transfer Details (indicate cue type and reason): Simulated Toileting- Clothing Manipulation and Hygiene: Moderate assistance, Sit to/from stand Toileting - Clothing Manipulation Details (indicate cue type and reason): for peri care in standing Functional mobility during ADLs: Moderate assistance, +2 for physical assistance(2 person HHA)  Cognition: Cognition Overall Cognitive Status: Impaired/Different from baseline Orientation Level: Oriented X4 Cognition Arousal/Alertness: Awake/alert Behavior During Therapy: WFL for tasks assessed/performed Overall Cognitive Status: Impaired/Different from baseline Area of Impairment:  Safety/judgement Safety/Judgement: Decreased awareness of deficits, Decreased awareness of safety General Comments: pt unaware of left lean in sitting or standing  Blood pressure (!) 119/108, pulse 99, temperature 99 F (37.2 C), temperature source Oral, resp. rate 18, height 5\' 11"  (1.803 m), weight 128.2 kg (282 lb 10.1 oz), SpO2 93 %. Physical Exam  Nursing note and vitals reviewed. Constitutional: He is oriented to person, place, and time. He appears well-developed and well-nourished.  HENT:  Head: Normocephalic and atraumatic.  Mouth/Throat: Oropharynx is clear and moist.  Eyes: Conjunctivae and EOM are normal. Pupils are equal, round, and reactive to light.  Neck: Normal range of motion. Neck supple.  Cardiovascular: Normal rate and regular rhythm.  Respiratory: Effort normal and breath sounds normal. No stridor. No respiratory distress. He has no wheezes.  GI: Soft. Bowel sounds are normal.  Musculoskeletal: He exhibits no edema or tenderness.  Left upper extremity 3- out of 5 proximal to distal.  Left lower extremity grossly 3 out of 5 proximal to distal.  Decreased sense of light touch and pain in left upper extremity.  Left lower extremity less effective from a sensory standpoint.  Toes are up and deep tendon reflexes are hyperactive in left arm and leg.  Patient with left central 7 and mild tongue deviation.  Visual fields are intact.  Cognitively displays reasonable insight and awareness.  Neurological: He is alert and oriented to person, place, and time. A cranial nerve deficit is present. Coordination abnormal.  Skin: Skin is warm and dry.  Psychiatric: He has a normal mood and affect. His behavior is normal. Thought content normal.    LabResultsLast24Hours  Results for orders placed or performed during the hospital encounter of 02/12/17 (from the past 24 hour(s))  MRSA PCR Screening     Status: None   Collection Time: 02/12/17  6:58 PM  Result Value Ref Range     MRSA by PCR NEGATIVE NEGATIVE  Hemoglobin A1c     Status: None   Collection Time: 02/13/17  7:53 AM  Result Value Ref Range   Hgb A1c MFr Bld 5.5 4.8 - 5.6 %   Mean Plasma Glucose 111.15 mg/dL  Lipid panel     Status: Abnormal   Collection Time: 02/13/17  7:53 AM  Result Value Ref Range   Cholesterol 185 0 - 200 mg/dL   Triglycerides 98 <952 mg/dL   HDL 28 (L) >84 mg/dL   Total CHOL/HDL Ratio 6.6 RATIO   VLDL 20 0 - 40 mg/dL   LDL Cholesterol 132 (H) 0 - 99 mg/dL  CBC     Status: None   Collection Time: 02/13/17  7:53 AM  Result Value Ref Range   WBC 8.1 4.0 - 10.5 K/uL   RBC 4.83 4.22 - 5.81 MIL/uL   Hemoglobin 14.8 13.0 - 17.0 g/dL   HCT 44.0 10.2 - 72.5 %   MCV 89.6 78.0 - 100.0 fL   MCH 30.6 26.0 - 34.0 pg   MCHC 34.2 30.0 - 36.0 g/dL   RDW 36.6 44.0 - 34.7 %   Platelets 248 150 - 400 K/uL  Basic metabolic panel     Status: Abnormal   Collection Time: 02/13/17  7:53 AM  Result Value Ref Range   Sodium 136 135 - 145 mmol/L   Potassium 3.7 3.5 - 5.1 mmol/L   Chloride 106 101 - 111 mmol/L   CO2 22 22 - 32 mmol/L   Glucose, Bld 110 (H) 65 - 99 mg/dL   BUN 11 6 - 20 mg/dL   Creatinine, Ser 4.25 0.61 - 1.24 mg/dL   Calcium 8.8 (L) 8.9 - 10.3 mg/dL   GFR calc non Af Amer >60 >60 mL/min   GFR calc Af Amer >60 >60 mL/min   Anion gap 8 5 - 15  HIV antibody     Status: None   Collection Time: 02/13/17  7:53 AM  Result Value Ref Range   HIV Screen 4th Generation wRfx Non Reactive Non Reactive      ImagingResults(Last48hours)  Ct Angio Head W Or Wo Contrast  Result Date: 02/12/2017 CLINICAL DATA:  Acute presentation with left-sided numbness an weakness. EXAM: CT ANGIOGRAPHY HEAD AND NECK TECHNIQUE: Multidetector CT imaging of the head and neck was performed using the standard protocol during bolus administration of intravenous contrast. Multiplanar CT image reconstructions and MIPs were obtained to evaluate the vascular anatomy.  Carotid stenosis measurements (when applicable) are obtained utilizing NASCET criteria, using the distal internal carotid diameter as the denominator. CONTRAST:  50mL ISOVUE-370 IOPAMIDOL (ISOVUE-370) INJECTION 76% COMPARISON:  CT earlier same day FINDINGS: CTA NECK FINDINGS Aortic arch: The aorta is normal without atherosclerotic change, aneurysm or dissection. Branching pattern of the brachiocephalic vessels from the arch is normal. Right carotid system: Common carotid artery widely patent to the bifurcation. Carotid bifurcation is normal without soft or calcified plaque. Cervical ICA is tortuous but normal. Left carotid system: Common carotid artery widely patent to the bifurcation region. Left carotid bifurcation is similarly normal without soft or calcified plaque. Cervical ICA is tortuous but widely patent. Vertebral arteries: Both vertebral artery origins are widely patent. The vertebral arteries are approximately  equal in size and widely patent through the cervical region. Skeleton: Ordinary mild midcervical spondylosis. Other neck: No mass or lymphadenopathy. Upper chest: Few small areas of indistinct density in the right upper lobe, measuring about 5-8 mm in size. Differential diagnosis is inflammatory foci versus masses. Complete chest CT recommended at some point for complete evaluation. Review of the MIP images confirms the above findings CTA HEAD FINDINGS Anterior circulation: Both internal carotid arteries are widely patent through the skullbase and siphon regions. The anterior and middle cerebral vessels are patent without proximal stenosis, aneurysm or vascular malformation. No embolic disease identified. Posterior circulation: Both vertebral arteries are patent to the basilar. No basilar stenosis. Posterior circulation branch vessels appear patent. Venous sinuses: Patent and normal. Anatomic variants: None significant Delayed phase: Delayed phase not acquired. Review of the MIP images confirms the  above findings IMPRESSION: Negative for large or medium vessel occlusion. No sign of atherosclerotic vascular disease, dissection or other focal vascular finding. Incidental detection of indistinct nodular shadows in the right upper lobe. These could represent inflammatory foci or nodules. Complete chest CT recommended at some point. These results were communicated by text at the time of interpretation on 02/12/2017 at 10:43 am to Dr. Arther Dames. Electronically Signed   By: Paulina Fusi M.D.   On: 02/12/2017 10:51   Ct Head Wo Contrast  Result Date: 02/13/2017 CLINICAL DATA:  Stroke patient post tPA therapy. EXAM: CT HEAD WITHOUT CONTRAST TECHNIQUE: Contiguous axial images were obtained from the base of the skull through the vertex without intravenous contrast. COMPARISON:  Brain MRI 02/12/2017, head CT 02/12/2017 FINDINGS: Brain: No evidence of intracranial hemorrhage. Small linear areas of acute/ subacute infarction in the right insula and right parietal lobe demonstrated by MRI are not seen by CT. The ventricular system is stable. Vascular: No hyperdense vessel or unexpected calcification. Skull: Normal. Negative for fracture or focal lesion. Sinuses/Orbits: No acute finding. Other: None. IMPRESSION: No evidence of acute intracranial hemorrhage. Small linear areas of acute/ subacute infarction in the right insula and right parietal lobe are not demonstrated by CT. Electronically Signed   By: Ted Mcalpine M.D.   On: 02/13/2017 10:34   Ct Angio Neck W Or Wo Contrast  Result Date: 02/12/2017 CLINICAL DATA:  Acute presentation with left-sided numbness an weakness. EXAM: CT ANGIOGRAPHY HEAD AND NECK TECHNIQUE: Multidetector CT imaging of the head and neck was performed using the standard protocol during bolus administration of intravenous contrast. Multiplanar CT image reconstructions and MIPs were obtained to evaluate the vascular anatomy. Carotid stenosis measurements (when applicable) are  obtained utilizing NASCET criteria, using the distal internal carotid diameter as the denominator. CONTRAST:  50mL ISOVUE-370 IOPAMIDOL (ISOVUE-370) INJECTION 76% COMPARISON:  CT earlier same day FINDINGS: CTA NECK FINDINGS Aortic arch: The aorta is normal without atherosclerotic change, aneurysm or dissection. Branching pattern of the brachiocephalic vessels from the arch is normal. Right carotid system: Common carotid artery widely patent to the bifurcation. Carotid bifurcation is normal without soft or calcified plaque. Cervical ICA is tortuous but normal. Left carotid system: Common carotid artery widely patent to the bifurcation region. Left carotid bifurcation is similarly normal without soft or calcified plaque. Cervical ICA is tortuous but widely patent. Vertebral arteries: Both vertebral artery origins are widely patent. The vertebral arteries are approximately equal in size and widely patent through the cervical region. Skeleton: Ordinary mild midcervical spondylosis. Other neck: No mass or lymphadenopathy. Upper chest: Few small areas of indistinct density in the right upper lobe,  measuring about 5-8 mm in size. Differential diagnosis is inflammatory foci versus masses. Complete chest CT recommended at some point for complete evaluation. Review of the MIP images confirms the above findings CTA HEAD FINDINGS Anterior circulation: Both internal carotid arteries are widely patent through the skullbase and siphon regions. The anterior and middle cerebral vessels are patent without proximal stenosis, aneurysm or vascular malformation. No embolic disease identified. Posterior circulation: Both vertebral arteries are patent to the basilar. No basilar stenosis. Posterior circulation branch vessels appear patent. Venous sinuses: Patent and normal. Anatomic variants: None significant Delayed phase: Delayed phase not acquired. Review of the MIP images confirms the above findings IMPRESSION: Negative for large or  medium vessel occlusion. No sign of atherosclerotic vascular disease, dissection or other focal vascular finding. Incidental detection of indistinct nodular shadows in the right upper lobe. These could represent inflammatory foci or nodules. Complete chest CT recommended at some point. These results were communicated by text at the time of interpretation on 02/12/2017 at 10:43 am to Dr. Arther DamesSUSHANTH AROOR. Electronically Signed   By: Paulina FusiMark  Shogry M.D.   On: 02/12/2017 10:51   Mr Brain Wo Contrast  Result Date: 02/12/2017 CLINICAL DATA:  52 y/o  M; left-sided weakness and numbness. EXAM: MRI HEAD WITHOUT CONTRAST TECHNIQUE: Multiplanar, multiecho pulse sequences of the brain and surrounding structures were obtained without intravenous contrast. COMPARISON:  02/13/2016 CT of the head. FINDINGS: Brain: Small area of reduced diffusion within right posterior insula and right lateral parietal compatible with acute/early subacute infarction. No associated hemorrhage or mass effect. Punctate focus of diffusion hyperintensity with intermediate diffusion and increased T2 FLAIR signal and right superior cerebellar hemisphere, likely subacute infarct. No focal mass effect, extra-axial collection, hydrocephalus, or effacement of basilar cisterns. Vascular: Normal flow voids. Skull and upper cervical spine: Normal marrow signal. Sinuses/Orbits: Negative. Other: None. IMPRESSION: 1. Small areas of acute/early subacute cortical infarction in right posterior insula and right lateral parietal lobe. No hemorrhage or mass effect. 2. Very small right superior cerebellar hemisphere subacute infarct. These results will be called to the ordering clinician or representative by the Radiologist Assistant, and communication documented in the PACS or zVision Dashboard. Electronically Signed   By: Mitzi HansenLance  Furusawa-Stratton M.D.   On: 02/12/2017 16:27   Ct Head Code Stroke Wo Contrast  Result Date: 02/12/2017 CLINICAL DATA:  Code stroke. Acute  presentation with left-sided numbness in weakness. Last seen normal 90 minutes ago. EXAM: CT HEAD WITHOUT CONTRAST TECHNIQUE: Contiguous axial images were obtained from the base of the skull through the vertex without intravenous contrast. COMPARISON:  None. FINDINGS: Brain: Normal appearance the supratentorial brain without evidence of old or acute infarction, mass lesion, hemorrhage, hydrocephalus or extra-axial collection. In the cerebellum, there are possible areas of low-density in the superior cerebellum on the right that could represent old or recent infarctions. No evidence of mass lesion, hemorrhage, hydrocephalus or extra-axial collection. Vascular:  No abnormal vascular findings. Skull: Normal Sinuses/Orbits: Clear/normal Other: None ASPECTS ([place] Stroke Program Early CT Score) - Ganglionic level infarction (caudate, lentiform nuclei, internal capsule, insula, M1-M3 cortex): 7 - Supraganglionic infarction (M4-M6 cortex): 3 Total score (0-10 with 10 being normal): 10 IMPRESSION: 1. No abnormal supratentorial finding. No cause of left-sided numbness and weakness is identified. 2. ASPECTS is 10. Question areas of low-density in the right cerebellum that could be recent or old infarctions. These results were communicated to at 10:29 amon 1/3/2019by text page via the [facility] messaging system. Electronically Signed   By: Loraine LericheMark  Shogry M.D.   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 Does the need for close, 24 hr/day medical supervision in concert with the patient's rehab needs make it unreasonable for this patient to be served in a less intensive setting? Yes 2. Co-Morbidities requiring supervision/potential complications: Hypertension and post stroke sequelae 3. Due to bladder management, bowel management, safety, skin/wound care, disease management, medication administration, pain management and patient education, does the  patient require 24 hr/day rehab nursing? Yes 4. Does the patient require coordinated care of a physician, rehab nurse, PT (1-2 hrs/day, 5 days/week), OT (1-2 hrs/day, 5 days/week) and SLP (1-2 hrs/day, 5 days/week) to address physical and functional deficits in the context of the above medical diagnosis(es)? Yes Addressing deficits in the following areas: balance, endurance, locomotion, strength, transferring, bowel/bladder control, bathing, dressing, feeding, grooming, toileting, cognition, speech, swallowing and psychosocial support 5. Can the patient actively participate in an intensive therapy program of at least 3 hrs of therapy per day at least 5 days per week? Yes 6. The potential for patient to make measurable gains while on inpatient rehab is excellent 7. Anticipated functional outcomes upon discharge from inpatient rehab are modified independent and supervision  with PT, modified independent and supervision with OT, modified independent with SLP. 8. Estimated rehab length of stay to reach the above functional goals is: 14-18 days 9. Anticipated D/C setting: Home 10. Anticipated post D/C treatments: HH therapy 11. Overall Rehab/Functional Prognosis: excellent  RECOMMENDATIONS: This patient's condition is appropriate for continued rehabilitative care in the following setting: CIR Patient has agreed to participate in recommended program. Yes Note that insurance prior authorization may be required for reimbursement for recommended care.  Comment: Rehab Admissions Coordinator to follow up.  Thanks,  Ranelle Oyster, MD, [place] [facility]    Jacquelynn Cree, PA-C 02/13/2017          Revision History

## 2017-02-17 NOTE — IPOC Note (Signed)
Overall Plan of Care [facility](IPOC) Patient Details Name: Jesus IlesChester Fogal III MRN: [id number] DOB: 1965-11-22  Admitting Diagnosis: Right parietal and cerebellar infarct   Hospital Problems: Active Problems:   Essential hypertension   Acute ischemic right MCA stroke (HCC)   Hyperlipidemia   Morbid obesity (HCC)     Functional Problem List: Nursing Endurance, Edema, Motor, Medication Management, Sensory, Skin Integrity  PT Balance, Endurance, Motor, Sensory, Safety  OT Balance, Cognition, Endurance, Motor, Sensory, Safety, Perception  SLP Cognition  TR         Basic ADL's: OT Eating, Grooming, Bathing, Dressing, Toileting     Advanced  ADL's: OT Simple Meal Preparation, Laundry, Light Housekeeping     Transfers: PT Bed Mobility, Bed to Chair, Set designerCar, Occupational psychologisturniture  OT Toilet, Research scientist (life sciences)Tub/Shower     Locomotion: PT Ambulation, Psychologist, prison and probation servicesWheelchair Mobility, Stairs     Additional Impairments: OT Fuctional Use of Upper Extremity  SLP Social Cognition, Communication expression Memory, Awareness  TR      Anticipated Outcomes Item Anticipated Outcome  Self Feeding modified independent  Swallowing      Basic self-care  modified independent  Toileting  modified independent   Bathroom Transfers modified independent to supervision  Bowel/Bladder  Pt will manage bowel and bladder with Mod  I assist at discharge   Transfers  modified independent basic transfers, supervision, car  Locomotion  supervision w/c propulsion x 150' controlled env and gait x 150' with LRAD controlled and community , x 50' household and up/down 12 steps 2 rails and (1)10" high step no rails   Communication  mod I   Cognition  mod I   Pain  Pt will manage pain at 3 or less on a scale of 0-10 while in rehab.   Safety/Judgment  Pt will remain free of falls with injury while in rehab with min assist    Therapy Plan: PT Intensity: Minimum of 1-2 x/day ,45 to 90 minutes PT Frequency: 5 out of 7 days PT Duration Estimated  Length of Stay: 7-10 OT Intensity: Minimum of 1-2 x/day, 45 to 90 minutes OT Frequency: 5 out of 7 days OT Duration/Estimated Length of Stay: 9-12 days SLP Intensity: Minumum of 1-2 x/day, 30 to 90 minutes SLP Frequency: 3 to 5 out of 7 days SLP Duration/Estimated Length of Stay: 7-10 days     Team Interventions: Nursing Interventions Patient/Family Education, Disease Management/Prevention, Skin Care/Wound Management, Medication Management, Cognitive Remediation/Compensation, Discharge Planning  PT interventions Ambulation/gait training, Balance/vestibular training, Discharge planning, Community reintegration, DME/adaptive equipment instruction, Patient/family education, Functional mobility training, Functional electrical stimulation, Neuromuscular re-education, Psychosocial support, Splinting/orthotics, Therapeutic Exercise, Therapeutic Activities, Stair training, UE/LE Strength taining/ROM, UE/LE Coordination activities, Visual/perceptual remediation/compensation, Wheelchair propulsion/positioning  OT Interventions Warden/rangerBalance/vestibular training, Cognitive remediation/compensation, FirefighterCommunity reintegration, Fish farm managerDME/adaptive equipment instruction, Disease mangement/prevention, Discharge planning, Functional electrical stimulation, Self Care/advanced ADL retraining, Therapeutic Activities, UE/LE Coordination activities, Therapeutic Exercise, Patient/family education, Functional mobility training, Neuromuscular re-education, UE/LE Strength taining/ROM  SLP Interventions Cognitive remediation/compensation, Cueing hierarchy, Environmental controls, Functional tasks, Internal/external aids, Patient/family education, Speech/Language facilitation  TR Interventions    SW/CM Interventions Discharge Planning, Psychosocial Support, Patient/Family Education   Barriers to Discharge MD  Medical stability  Nursing      PT      OT      SLP      SW       Team Discharge Planning: Destination: PT-Home ,OT-   ,  SLP-Home Projected Follow-up: PT-Outpatient PT, OT-   , SLP-Outpatient SLP Projected Equipment Needs: PT-To be determined, OT-  ,  SLP-None recommended by SLP Equipment Details: PT- , OT-  Patient/family involved in discharge planning: PT- Patient, Family member/caregiver,  OT- , SLP-Patient  MD ELOS: 6-9 days. Medical Rehab Prognosis:  Good  Assessment: 52 y.o.malewith history of HTN who was admitted on 02/12/17 with left facial and left sided weakness with numbness.CT head negative with question areas of low density right cerebellum--recent or old infarcts. He received tPA and followMRI brain done showing "small areas of acute/early subacute cortical infarction in right posterior insula and right lateral parietal lobeas well as very small right superior cerebellar subacute infarct".UDS negative and hypercoagulopathy panelpositive for lupus anticoagulant--question to be repeated. Dr. Roda Shutters recommended work up for embolic source and TEEwas negative for PFO or thrombus. EF 60-65% with no wall abnormality and loop recorder placed01/7. On ASA for embolic strokedue to unknown source. CTA head/neck without large for medium vessel occlusion, no carotid stenosis and showed indistinct nodular shadow RUL--CT chest recommended at some point. BLE dopplers negative for DVT. Therapy ongoing and patient with limited by left sided weakness with sensory deficits, mild dysarthria, higher level cognitive deficits, balance deficits. Will set goals for Mod I with PT/OT and SLP.  See Team Conference Notes for weekly updates to the plan of care

## 2017-02-17 NOTE — Progress Notes (Signed)
CSW updated by Rehab Admissions Coordinator that patient will admit to CIR today; no further CSW needs.  CSW signing off.  Blenda NicelyElizabeth Jamar Casagrande, KentuckyLCSW Clinical Social Worker [phone]

## 2017-02-17 NOTE — Care Management Note (Signed)
Case Management Note  Patient Details  Name: [name] MRN: [id number] Date of Birth: 12-Apr-1965  Subjective/Objective:    Pt admitted with CVA. He is from home with spouse.             Action/Plan: Pt discharging to CIR today. No further needs per CM.  Expected Discharge Date:  02/17/17               Expected Discharge Plan:  IP Rehab Facility  In-House Referral:     Discharge planning Services  CM Consult  Post Acute Care Choice:    Choice offered to:     DME Arranged:    DME Agency:     HH Arranged:    HH Agency:     Status of Service:  Completed, signed off  If discussed at [employer] Length of [employer] Meetings, dates discussed:    Additional Comments:  Kermit BaloKelli F Tykiera Raven, RN 02/17/2017, 11:24 AM

## 2017-02-17 NOTE — Progress Notes (Signed)
Pt admitted to 4M06. Pt with no pain and vitals are WNL. All belongings are at bedside. Pt oriented to unit. Continue plan of care.

## 2017-02-17 NOTE — Progress Notes (Signed)
I have insurance approval and bed available to admit pt to today. I met with pt and his wife and they are in agreement. Dr. Leonie Man, RN CM and SW are aware. I will make the arrangements to admit today. [phone]

## 2017-02-18 ENCOUNTER — Inpatient Hospital Stay (HOSPITAL_COMMUNITY): Payer: BLUE CROSS/BLUE SHIELD | Admitting: Occupational Therapy

## 2017-02-18 ENCOUNTER — Inpatient Hospital Stay (HOSPITAL_COMMUNITY): Payer: BLUE CROSS/BLUE SHIELD | Admitting: Speech Pathology

## 2017-02-18 ENCOUNTER — Inpatient Hospital Stay (HOSPITAL_COMMUNITY): Payer: BLUE CROSS/BLUE SHIELD

## 2017-02-18 DIAGNOSIS — E785 Hyperlipidemia, unspecified: Secondary | ICD-10-CM

## 2017-02-18 LAB — CBC WITH DIFFERENTIAL/PLATELET
Basophils Absolute: 0 10*3/uL (ref 0.0–0.1)
Basophils Relative: 0 %
Eosinophils Absolute: 0 10*3/uL (ref 0.0–0.7)
Eosinophils Relative: 0 %
HCT: 45.1 % (ref 39.0–52.0)
Hemoglobin: 15 g/dL (ref 13.0–17.0)
Lymphocytes Relative: 22 %
Lymphs Abs: 1.3 10*3/uL (ref 0.7–4.0)
MCH: 30.2 pg (ref 26.0–34.0)
MCHC: 33.3 g/dL (ref 30.0–36.0)
MCV: 90.9 fL (ref 78.0–100.0)
Monocytes Absolute: 0.8 10*3/uL (ref 0.1–1.0)
Monocytes Relative: 13 %
Neutro Abs: 4 10*3/uL (ref 1.7–7.7)
Neutrophils Relative %: 65 %
Platelets: 286 10*3/uL (ref 150–400)
RBC: 4.96 MIL/uL (ref 4.22–5.81)
RDW: 12.7 % (ref 11.5–15.5)
WBC: 6.1 10*3/uL (ref 4.0–10.5)

## 2017-02-18 LAB — COMPREHENSIVE METABOLIC PANEL
ALT: 45 U/L (ref 17–63)
AST: 35 U/L (ref 15–41)
Albumin: 3.5 g/dL (ref 3.5–5.0)
Alkaline Phosphatase: 84 U/L (ref 38–126)
Anion gap: 9 (ref 5–15)
BUN: 25 mg/dL — ABNORMAL HIGH (ref 6–20)
CO2: 24 mmol/L (ref 22–32)
Calcium: 9.1 mg/dL (ref 8.9–10.3)
Chloride: 105 mmol/L (ref 101–111)
Creatinine, Ser: 1.18 mg/dL (ref 0.61–1.24)
GFR calc Af Amer: >60 mL/min (ref >60)
GFR calc non Af Amer: >60 mL/min (ref >60)
Glucose, Bld: 100 mg/dL — ABNORMAL HIGH (ref 65–99)
Potassium: 3.8 mmol/L (ref 3.5–5.1)
Sodium: 138 mmol/L (ref 135–145)
Total Bilirubin: 2.3 mg/dL — ABNORMAL HIGH (ref 0.3–1.2)
Total Protein: 7.2 g/dL (ref 6.5–8.1)

## 2017-02-18 MED ORDER — FAMOTIDINE 20 MG PO TABS
20.0000 mg | ORAL_TABLET | Freq: Every day | ORAL | Status: DC
  Administered 2017-02-18 – 2017-02-26 (×9): 20 mg via ORAL
  Filled 2017-02-18 (×9): qty 1

## 2017-02-18 NOTE — Evaluation (Signed)
Occupational Therapy Assessment and Plan  Patient Details  Name: [name] MRN: [id number] Date of Birth: 01-Jul-1965  OT Diagnosis: cognitive deficits, hemiplegia affecting non-dominant side and muscle weakness (generalized) Rehab Potential: Rehab Potential (ACUTE ONLY): Excellent ELOS: 9-12 days   Today's Date: 02/18/2017 OT Individual Time: 1300-1403 OT Individual Time Calculation (min): 63 min     Problem List:  Patient Active Problem List   Diagnosis Date Noted  . Hyperlipidemia   . Morbid obesity ([place])   . Acute ischemic right MCA stroke ([place]) 02/17/2017  . Cryptogenic stroke ([place]) 02/12/2017  . Allergic rhinitis 11/07/2016  . BMI 39.0-39.9,adult 11/07/2016  . Essential hypertension 10/18/2016    Past Medical History:  Past Medical History:  Diagnosis Date  . Environmental allergies   . Fall from roof    with kidney trauma  . Hypertension 2018   Past Surgical History:  Past Surgical History:  Procedure Laterality Date  . LOOP RECORDER INSERTION N/A 02/16/2017   Procedure: LOOP RECORDER INSERTION;  Surgeon: Constance Haw, MD;  Location: [facility];  Service: Cardiovascular;  Laterality: N/A;  . NO PAST SURGERIES    . TEE WITHOUT CARDIOVERSION N/A 02/16/2017   Procedure: TRANSESOPHAGEAL ECHOCARDIOGRAM (TEE);  Surgeon: Jerline Pain, MD;  [facility]: [facility] ENDOSCOPY;  Service: Cardiovascular;  Laterality: N/A;    Assessment & Plan Clinical Impression: Patient is a 52 y.o. year old male with recent admission to the hospital on 02/12/17 with left facial and left sided weakness with numbness.CT head negative with question areas of low density right cerebellum--recent or old infarcts. He received tPA and followMRI brain done showing "small areas of acute/early subacute cortical infarction in right posterior insula and right lateral parietal lobeas well as very small right superior cerebellar subacute infarct".  Patient transferred to CIR on 02/17/2017 .     Patient currently requires min with basic self-care skills secondary to muscle weakness, decreased cardiorespiratoy endurance, decreased attention to left, decreased problem solving and decreased memory and decreased standing balance, hemiplegia and decreased balance strategies.  Prior to hospitalization, patient could complete ADLs with independent .  Patient will benefit from skilled intervention to decrease level of assist with basic self-care skills and increase independence with basic self-care skills prior to discharge home with care partner.  Anticipate patient will require intermittent supervision and follow up outpatient.  OT - End of Session Activity Tolerance: Tolerates 30+ min activity with multiple rests Endurance Deficit: Yes OT Assessment Rehab Potential (ACUTE ONLY): Excellent OT Patient demonstrates impairments in the following area(s): Balance;Cognition;Endurance;Motor;Sensory;Safety;Perception OT Basic ADL's Functional Problem(s): Eating;Grooming;Bathing;Dressing;Toileting OT Advanced ADL's Functional Problem(s): Simple Meal Preparation;Laundry;Light Housekeeping OT Transfers Functional Problem(s): Toilet;Tub/Shower OT Additional Impairment(s): Fuctional Use of Upper Extremity OT Plan OT Intensity: Minimum of 1-2 x/day, 45 to 90 minutes OT Frequency: 5 out of 7 days OT Duration/Estimated Length of Stay: 9-12 days OT Treatment/Interventions: Balance/vestibular training;Cognitive remediation/compensation;Community reintegration;DME/adaptive equipment instruction;Disease mangement/prevention;Discharge planning;Functional electrical stimulation;Self Care/advanced ADL retraining;Therapeutic Activities;UE/LE Coordination activities;Therapeutic Exercise;Patient/family education;Functional mobility training;Neuromuscular re-education;UE/LE Strength taining/ROM OT Self Feeding Anticipated Outcome(s): modified independent OT Basic Self-Care Anticipated Outcome(s): modified  independent OT Toileting Anticipated Outcome(s): modified independent OT Bathroom Transfers Anticipated Outcome(s): modified independent to supervision   Skilled Therapeutic Intervention Pt began working on selfcare retraining at shower level.  He was able to complete all transfers into and out of the shower with min hand held assist.  He demonstrated decreased sequencing and problem solving when attempting to perform washing and being sure to place water and  soap on the washcloth.  He needed mod instructional cueing to wash the right arm and initiate use of the LUE to do so.  On many occasions he would have the left arm sitting in his lap or off to the side, without attempts to use it such as needed for tying shoes and donning socks.  Noted shirt sleeve and shorts leg on the left were getting caught causing a disruption in his ability to donn them completely.  He was not able to recognize it without cueing from therapist.  Mod assist to tie shoes.   Pt left at bedside with call button and phone in reach.    OT Evaluation Precautions/Restrictions  Precautions Precautions: Fall Precaution Comments: left neglect/inattention Restrictions Weight Bearing Restrictions: No  Pain Pain Assessment Pain Assessment: No/denies pain Home Living/Prior Functioning Home Living Family/patient expects to be discharged to:: Private residence Living Arrangements: Spouse/significant other Available Help at Discharge: Family, Available 24 hours/day Type of Home: House Home Access: Stairs to enter [employer] of Steps: 1 Entrance Stairs-Rails: None Home Layout: One level Bathroom Shower/Tub: Public librarian, Charity fundraiser: Standard Bathroom Accessibility: Yes  Lives With: Spouse IADL History Homemaking Responsibilities: Yes Meal Prep Responsibility: Secondary Laundry Responsibility: Secondary Bill Paying/Finance Responsibility: Primary Mode of Transportation: Car Occupation: Full  time employment 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 Prior Function Level of Independence: Independent with basic ADLs  Able to Take Stairs?: Yes Driving: Yes Vocation: Full time employment Leisure: Hobbies-yes (Comment)(2 dogs, follows local hockey and basketball) Comments: Works in Educational psychologist, works from home ADL  See Function Section of chart for [place] Baseline Vision/History: Wears glasses Wears Glasses: At all times Patient Visual Report: No change from baseline Vision Assessment?: Vision impaired- to be further tested in functional context Perception  Perception: Impaired Inattention/Neglect: Does not attend to left side of body(Pt leaves the LUE in his lap or to the side of his body and does not spontaneously attempt functional use with tasks that would normally require use of both hands.  ) Praxis Praxis: Intact Cognition Overall Cognitive Status: Impaired/Different from baseline Arousal/Alertness: Awake/alert Orientation Level: Person;Place;Situation Person: Oriented Place: Oriented Situation: Oriented Year: 2019 Month: January Day of Week: Correct Memory: Impaired Memory Impairment: Decreased recall of new information Immediate Memory Recall: Sock;Blue;Bed Memory Recall: Sock;Blue;Bed Memory Recall Sock: Without Cue Memory Recall Blue: Without Cue Memory Recall Bed: Without Cue Attention: Sustained;Selective Focused Attention: Appears intact Sustained Attention: Appears intact Selective Attention: Appears intact Awareness: Impaired Awareness Impairment: Anticipatory impairment;Emergent impairment Problem Solving: Appears intact Executive Function: Self Monitoring;Self Correcting Self Monitoring: Impaired Self Monitoring Impairment: Functional complex Self Correcting: Impaired Self Correcting Impairment: Functional complex Safety/Judgment: Appears  intact Sensation Sensation Light Touch: Impaired Detail Light Touch Impaired Details: Impaired LUE Stereognosis: Impaired Detail Stereognosis Impaired Details: Impaired LUE Hot/Cold: Impaired Detail Hot/Cold Impaired Details: Impaired LUE Proprioception: Impaired Detail Proprioception Impaired Details: Impaired LUE Additional Comments: Pt with decreased light touch throughout UE with severely impaired proprioception in the left hand. Coordination Gross Motor Movements are Fluid and Coordinated: No Fine Motor Movements are Fluid and Coordinated: No Coordination and Movement Description: Pt demonstrates decreased gross and FM coordination in the LUE, requiring min assist to integrate into functional use. Motor  Motor Motor: Hemiplegia Mobility    See Function Section of chart for details  Trunk/Postural Assessment  Cervical Assessment Cervical Assessment: Within Functional Limits Thoracic Assessment Thoracic Assessment: Within Functional Limits  Lumbar Assessment Lumbar Assessment: Within Functional Limits Postural Control Postural Control: Deficits on evaluation Righting Reactions: leans L and forward during ambulation  Balance Balance Balance Assessed: Yes Static Sitting Balance Static Sitting - Balance Support: No upper extremity supported;Feet supported Static Sitting - Level of Assistance: 6: Modified independent (Device/Increase time) Dynamic Sitting Balance Dynamic Sitting - Balance Support: During functional activity Dynamic Sitting - Level of Assistance: 5: Stand by assistance Static Standing Balance Static Standing - Balance Support: During functional activity;Left upper extremity supported Static Standing - Level of Assistance: 4: Min assist Dynamic Standing Balance Dynamic Standing - Balance Support: During functional activity Dynamic Standing - Level of Assistance: 3: Mod assist Extremity/Trunk Assessment RUE Assessment RUE Assessment: Within Functional  Limits LUE Assessment LUE Assessment: Exceptions to [facility] LUE Strength LUE Overall Strength Comments: Pt exhibits AROM WFLS for all joints.  Decreased finger to nose gross coordination as well as decreased ability to accurately oppose thumb to digits 4 and 5.  Strength 4/5 throughout for shoulder flexion, elbow flexion/extension, and gross grasp.     See Function Navigator for Current Functional Status.   Refer to Care Plan for Long Term Goals  Recommendations for other services: None    Discharge Criteria: Patient will be discharged from OT if patient refuses treatment 3 consecutive times without medical reason, if treatment goals not met, if there is a change in medical status, if patient makes no progress towards goals or if patient is discharged from hospital.  The above assessment, treatment plan, treatment alternatives and goals were discussed and mutually agreed upon: by patient  MCGUIRE,JAMES OTR/L 02/18/2017, 4:22 PM

## 2017-02-18 NOTE — Plan of Care (Signed)
Nutrition Education Note  RD consulted for nutrition education regarding a Heart Healthy diet.   Lipid Panel     Component Value Date/Time   CHOL 185 02/13/2017 0753   CHOL 205 (H) 11/26/2016 1040   TRIG 98 02/13/2017 0753   HDL 28 (L) 02/13/2017 0753   HDL 34 (L) 11/26/2016 1040   CHOLHDL 6.6 02/13/2017 0753   VLDL 20 02/13/2017 0753   LDLCALC 137 (H) 02/13/2017 0753   LDLCALC 143 (H) 11/26/2016 1040    RD provided "Heart Healthy Nutrition Therapy" handout from the Academy of Nutrition and Dietetics. Reviewed patient's dietary recall. Provided examples on ways to decrease sodium and fat intake in diet. Discouraged intake of processed foods and use of salt shaker. Encouraged fresh fruits and vegetables to maximize fiber intake. Teach back method used.  Expect good compliance.  Body mass index is 39.66 kg/m. Pt meets criteria for class II obesity based on current BMI.  Current diet order is regular, patient is consuming approximately 70% of meals at this time. Labs and medications reviewed. No further nutrition interventions warranted at this time. If additional nutrition issues arise, please re-consult RD.  Roslyn SmilingStephanie Gracielynn Birkel, MS, RD, LDN Pager # [phone] After hours/ weekend pager # [phone]

## 2017-02-18 NOTE — Progress Notes (Addendum)
Physical Therapy Assessment and Plan  Patient Details  Name: [name] MRN: [id number] Date of Birth: 1965-02-25  PT Diagnosis: Abnormality of gait, Dizziness and giddiness, Hemiparesis non-dominant, Hypotonia, Impaired sensation and Muscle spasms Rehab Potential: Good ELOS: 7-10   Today's Date: 02/18/2017 PT Individual Time: 0900-1020 PT Individual Time Calculation (min): 80 min    Problem List:  Patient Active Problem List   Diagnosis Date Noted  . Hyperlipidemia   . Morbid obesity ([place])   . Acute ischemic right MCA stroke ([place]) 02/17/2017  . Cryptogenic stroke ([place]) 02/12/2017  . Allergic rhinitis 11/07/2016  . BMI 39.0-39.9,adult 11/07/2016  . Essential hypertension 10/18/2016    Past Medical History:  Past Medical History:  Diagnosis Date  . Environmental allergies   . Fall from roof    with kidney trauma  . Hypertension 2018   Past Surgical History:  Past Surgical History:  Procedure Laterality Date  . LOOP RECORDER INSERTION N/A 02/16/2017   Procedure: LOOP RECORDER INSERTION;  Surgeon: Constance Haw, MD;  Location: [facility];  Service: Cardiovascular;  Laterality: N/A;  . NO PAST SURGERIES    . TEE WITHOUT CARDIOVERSION N/A 02/16/2017   Procedure: TRANSESOPHAGEAL ECHOCARDIOGRAM (TEE);  Surgeon: Jerline Pain, MD;  Location: [facility] ENDOSCOPY;  Service: Cardiovascular;  Laterality: N/A;    Assessment & Plan Clinical Impression: Jesus Blanford IIIis a 52 y.o.malewith history of HTN who was admitted on 02/12/17 with left facial and left sided weakness with numbness.CT head negative with question areas of low density right cerebellum--recent or old infarcts. He received tPA and followMRI brain done showing "small areas of acute/early subacute cortical infarction in right posterior insula and right lateral parietal lobeas well as very small right superior cerebellar subacute infarct".UDS negative and hypercoagulopathy panelpositive for lupus  anticoagulant--question to be repeated.  Dr. Erlinda Hong recommended work up for embolic source and TEEwas negative for PFO or thrombus. EF 60-65% with no wall abnormality and loop recorder placed01/7. On ASA for embolic strokedue to unknown source. CTA head/neck without large for medium vessel occlusion, no carotid stenosis and showed indistinct nodular shadow RUL--CT chest recommended at some point.    Patient transferred to CIR on 02/17/2017 .   Patient currently requires total with mobility secondary to decreased cardiorespiratoy endurance, impaired timing and sequencing, abnormal tone, unbalanced muscle activation and decreased coordination, blurriness and decreased sitting balance, decreased standing balance, decreased postural control, hemiplegia and decreased balance strategies.  Prior to hospitalization, patient was independent  with mobility and lived with Spouse in a House home.  Home access is 1Stairs to enter.  Patient will benefit from skilled PT intervention to maximize safe functional mobility, minimize fall risk and decrease caregiver burden for planned discharge home with 24 hour supervision.  Anticipate patient will benefit from follow up OP at discharge.  PT - End of Session Activity Tolerance: Tolerates < 10 min activity with changes in vital signs Endurance Deficit: Yes Endurance Deficit Description: fatigued, DOE after gait x 50' PT Assessment Rehab Potential (ACUTE/IP ONLY): Good PT Patient demonstrates impairments in the following area(s): Balance;Endurance;Motor;Sensory;Safety PT Transfers Functional Problem(s): Bed Mobility;Bed to Chair;Car;Furniture PT Locomotion Functional Problem(s): Ambulation;Wheelchair Mobility;Stairs PT Plan PT Intensity: Minimum of 1-2 x/day ,45 to 90 minutes PT Frequency: 5 out of 7 days PT Duration Estimated Length of Stay: 7-10 PT Treatment/Interventions: Ambulation/gait training;Balance/vestibular training;Discharge planning;Community  reintegration;DME/adaptive equipment instruction;Patient/family education;Functional mobility training;Functional electrical stimulation;Neuromuscular re-education;Psychosocial support;Splinting/orthotics;Therapeutic Exercise;Therapeutic Activities;Stair training;UE/LE Strength taining/ROM;UE/LE Coordination activities;Visual/perceptual remediation/compensation;Wheelchair propulsion/positioning PT Transfers Anticipated Outcome(s):  modified independent basic transfers, supervision, car PT Locomotion Anticipated Outcome(s): supervision w/c propulsion x 150' controlled env and gait x 150' with LRAD controlled and community , x 50' household and up/down 12 steps 2 rails and (1)10" high step no rails  PT Recommendation Follow Up Recommendations: Outpatient PT Patient destination: Home Equipment Recommended: To be determined  Skilled Therapeutic Intervention Pt remembered 2/3 words over 15 min.  He did not recognize his hall or know room number.  Pt made limited eye contact initially, but it improved during session.  Pt is somewhat unsafe due to ballistic motions, due to body habitus, confirmed by wife.  No other impulsive behaviors noted.  Pt attempted to use RUE spontaneously often, with poor success due to sensory deficits.  When asked to turn R to sit in amrchair when ambulating, pt unable, turning L in order to place R hand on armrest of chair.  He stated he tried to " go turn that way, but just couldn't.  W/c propulsion using bil UEs and/or bil LEs for LUE and LLE neuro re-ed; pt needed visual feedback for safety and success.  Reviewed use of call bell and falls precautions.  Pt left resting in w/c with  all needs within reach. Wife and parents present.  PT Evaluation Precautions/Restrictions Precautions Precautions: Fall Restrictions Weight Bearing Restrictions: No General   Vital SignsTherapy Vitals Pulse Rate: (!) 102 BP:145/83 Patient Position (if appropriate): Sitting Oxygen Therapy SpO2:  96 % O2 Device: Not Delivered Pain Pain Assessment Pain Assessment: No/denies pain Home Living/Prior Functioning Home Living Available Help at Discharge: Family;Available 24 hours/day Type of Home: House Home Access: Stairs to enter [employer] of Steps: 1 Entrance Stairs-Rails: None Home Layout: One level Bathroom Shower/Tub: Tub/shower unit;Door [employer] Toilet: Programmer, systems: Yes  Lives With: Spouse Prior Function Level of Independence: Independent with gait  Able to Take Stairs?: Yes Driving: Yes Vocation: Full time employment Leisure: Hobbies-yes (Comment)(2 dogs, follows local hockey and basketball) Comments: Works in Educational psychologist, works from home Vision/Perception describes blurriness; tracks in all quadrants and converges normally during brief assessment    Cognition Overall Cognitive Status: Within Functional Limits for tasks assessed Arousal/Alertness: Awake/alert Orientation Level: Oriented X4 Sensation Sensation Light Touch: Impaired Detail Light Touch Impaired Details: Absent LLE;Impaired LLE(absent from mid LL to groin) Proprioception: Impaired Detail Proprioception Impaired Details: Absent LLE Additional Comments: has some kinesthesia but not proprioception Coordination Fine Motor Movements are Fluid and Coordinated: No Heel Shin Test: limited speed LLe` Motor  Motor Motor: Hemiplegia;Abnormal tone Motor - Skilled Clinical Observations: hypotonic LLE  Mobility Bed Mobility Bed Mobility: Rolling Right;Rolling Left;Left Sidelying to Sit Rolling Right: 5: Supervision Rolling Right Details: Verbal cues for precautions/safety Rolling Left: 5: Supervision Rolling Left Details: Verbal cues for precautions/safety Left Sidelying to Sit: 5: Supervision;With rails;HOB flat Left Sidelying to Sit Details: Verbal cues for precautions/safety Transfers Transfers: Yes Stand Pivot Transfers: 4: Min assist Stand Pivot Transfer  Details: Manual facilitation for weight shifting Squat Pivot Transfers: Not tested (comment) Locomotion  Ambulation Ambulation: Yes Ambulation/Gait Assistance: 1: +2 Total assist Ambulation Distance (Feet): 50 Feet Assistive device: 2 person hand held assist Ambulation/Gait Assistance Details: Verbal cues for technique;Manual facilitation for weight shifting Gait Gait: Yes Gait Pattern: Decreased stride length;Decreased step length - left;Step-to pattern;Decreased trunk rotation;Narrow base of support;Decreased hip/knee flexion - left;Left flexed knee in stance Stairs / Additional Locomotion Ramp: Not tested (comment) Product manager Mobility: Yes Wheelchair Assistance: 3: Mod assist Wheelchair Propulsion: Right upper extremity;Both  upper extremities Wheelchair Parts Management: Needs assistance Distance: 150  Trunk/Postural Assessment  Cervical Assessment Cervical Assessment: Within Functional Limits Thoracic Assessment Thoracic Assessment: Within Functional Limits Lumbar Assessment Lumbar Assessment: Within Functional Limits Postural Control Postural Control: Deficits on evaluation Righting Reactions: leans L and forward during ambulation  Balance Balance Balance Assessed: Yes Static Sitting Balance Static Sitting - Level of Assistance: 5: Stand by assistance Dynamic Sitting Balance Dynamic Sitting - Level of Assistance: 4: Min assist Static Standing Balance Static Standing - Level of Assistance: 4: Min assist Dynamic Standing Balance Dynamic Standing - Level of Assistance: 1: +2 Total assist(for safety during gait) Extremity Assessment      RLE Assessment RLE Assessment: Within Functional Limits LLE Assessment LLE Assessment: Exceptions to [facility] LLE Strength LLE Overall Strength Comments: grossly in sitting: 4/5 hip flex, knee ext/flex, ankle DF   See Function Navigator for Current Functional Status.   Refer to Care Plan for Long Term  Goals  Recommendations for other services: Neuropsych For subtle cognitive deficits Discharge Criteria: Patient will be discharged from PT if patient refuses treatment 3 consecutive times without medical reason, if treatment goals not met, if there is a change in medical status, if patient makes no progress towards goals or if patient is discharged from hospital.  The above assessment, treatment plan, treatment alternatives and goals were discussed and mutually agreed upon: by patient and by family  Collis Thede 02/18/2017, 10:42 AM

## 2017-02-18 NOTE — Progress Notes (Signed)
Social Work Assessment and Plan Social Work Assessment and Plan  Patient Details  Name: [name] MRN: [id number] Date of Birth: 1965/03/29  Today's Date: 02/18/2017  Problem List:  Patient Active Problem List   Diagnosis Date Noted  . Hyperlipidemia   . Morbid obesity ([facility])   . 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 Cryptogenic stroke ([facility]) 02/12/2017  . Allergic rhinitis 11/07/2016  . BMI 39.0-39.9,adult 11/07/2016  . Essential hypertension 10/18/2016   Past Medical History:  Past Medical History:  Diagnosis Date  . Environmental allergies   . Fall from roof    with kidney trauma  . Hypertension 2018   Past Surgical History:  Past Surgical History:  Procedure Laterality Date  . LOOP RECORDER INSERTION N/A 02/16/2017   Procedure: LOOP RECORDER INSERTION;  Surgeon: Regan Lemmingamnitz, Will Martin, MD;  Location: [facility];  Service: Cardiovascular;  Laterality: N/A;  . NO PAST SURGERIES    . TEE WITHOUT CARDIOVERSION N/A 02/16/2017   Procedure: TRANSESOPHAGEAL ECHOCARDIOGRAM (TEE);  Surgeon: Jake BatheSkains, Mark C, MD;  [facility];  Service: Cardiovascular;  Laterality: N/A;   Social History:  reports that  has never smoked. he has never used smokeless tobacco. He reports that he drinks alcohol. He reports that he does not use drugs.  Family / Support Systems Marital Status: Married Patient Roles: Spouse, Other (Comment)(son & employee) Spouse/Significant Other: Chip BoerVicki [phone] Other Supports: Parents who are retired [place] Anticipated Caregiver: Wife Ability/Limitations of Caregiver: Wife can change to work at home and also take FMLA-if needed Caregiver Availability: 24/7 Family Dynamics: Close with wif eand his parent's they are always there for one another and will continue to be. Pt is hopeful he will reach mod/i level so not to burden his wife and parent's. They have colleagues and friends who are also supportive.    Social History Preferred language: English Religion:  Cultural Background: No issues Education: Automotive engineerCollege educated Read: Yes Write: Yes Employment Status: Employed Name of Employer: [employer] Length of Employment: 20 Return to Work Plans: Plans to return to work when able Fish farm managerLegal Hisotry/Current Legal Issues: No issues Guardian/Conservator: None-according to MD pt is capable of making his own decisions while here   Abuse/Neglect Abuse/Neglect Assessment Can Be Completed: Yes Physical Abuse: Denies Verbal Abuse: Denies Sexual Abuse: Denies Exploitation of patient/patient's resources: Denies Self-Neglect: Denies  Emotional Status Pt's affect, behavior adn adjustment status: Pt is motivated to do well here and make progress, he is hoping he will reach mod/i level so not to burden his wife. He has always been independent and taken care of himself until this. He feels good about how today has gone and can see progress from when he first got her. Recent Psychosocial Issues: other health issues thought were managed with going to MD and medications Pyschiatric History: No history deferred depression screen due to pt able to verbalize his feelings and concerns. Do feel he would benefit from seeing neuro-psych while here due to young age and for coping. Will ask team for input also. Substance Abuse History: No issues  Patient / Family Perceptions, Expectations & Goals Pt/Family understanding of illness & functional limitations: Pt and wife can explain his strokes and deficits. They both do talk with the MD's and feel they have a good understanding of his treatment going forward. Pt is one who will ask questions and wants the straight answer even if not good. Premorbid pt/family roles/activities: husband, son, employee, home owner, etc  Anticipated changes in roles/activities/participation: resume Pt/family expectations/goals: Pt states: " I want to be able to take care of myself before  I leave here."  Wife states: " I hope he does well but will do whatever he needs."  [employer]: None Premorbid Home Care/DME Agencies: None Transportation available at discharge: wife Resource referrals recommended: Neuropsychology, Support group (specify)  Discharge Planning Living Arrangements: Spouse/significant other Support Systems: Spouse/significant other, Parent, Friends/neighbors Type of Residence: Private residence Civil engineer, contracting: Media planner (specify)(BCBS) Surveyor, quantity Resources: Employment, Garment/textile technologist Screen Referred: No Living Expenses: Database administrator Management: Patient, Spouse Does the patient have any problems obtaining your medications?: No Home Management: Both he and wife Patient/Family Preliminary Plans: Return home with wife who can adjust her schedule to be at hoe to provide care or take FMLA. Pt's parent's are supportive and involved and can assist also if needed. WIll await therapy team evaluations and work on discharge needs. Social Work Anticipated Follow Up Needs: HH/OP, Support Group  Clinical Impression Pleasant talkative gentleman who is motivated to do well and will work hard to achieve his goals of mod/i level. His wife and parent's are supportive and involved and will assist if needed. Will await team's evaluations and work on discharge needs, do feel he would benefit form seeing neuro-psych while here. Will make referral.  Lucy Chris 02/18/2017, 3:17 PM

## 2017-02-18 NOTE — Progress Notes (Signed)
[place] PHYSICAL MEDICINE & REHABILITATION     PROGRESS NOTE  Subjective/Complaints:  Pt seen lying in bed this AM.  He slept well overnight.  He states he is ready to begin therapies.   ROS: Denies CP, SOB, N/V/D.  Objective: Vital Signs: Blood pressure (!) 146/79, pulse 97, temperature 98.9 F (37.2 C), temperature source Oral, resp. rate 16, height 5\' 11"  (1.803 m), weight 129 kg (284 lb 6.3 oz), SpO2 96 %. No results found. Recent Labs    02/18/17 0555  WBC 6.1  HGB 15.0  HCT 45.1  PLT 286   Recent Labs    02/18/17 0555  NA 138  K 3.8  CL 105  GLUCOSE 100*  BUN 25*  CREATININE 1.18  CALCIUM 9.1   CBG (last 3)  No results for input(s): GLUCAP in the last 72 hours.  Wt Readings from Last 3 Encounters:  02/18/17 129 kg (284 lb 6.3 oz)  02/12/17 128.2 kg (282 lb 10.1 oz)  11/26/16 129.6 kg (285 lb 12.8 oz)    Physical Exam:  BP (!) 146/79 (BP Location: Right Arm)   Pulse 97   Temp 98.9 F (37.2 C) (Oral)   Resp 16   Ht 5\' 11"  (1.803 m)   Wt 129 kg (284 lb 6.3 oz)   SpO2 96%   BMI 39.66 kg/m  Constitutional: He appearswell-developedand Obese.  HENT: Normocephalicand atraumatic.  Eyes: EOMI. No discharge. Cardiovascular:Normal rateand regular rhythm. No JVD. Respiratory:Breath sounds normal. Unlabored. WU:[id number]:Bowel sounds are normal. He exhibitsno distension.  Neurological: He isalertand oriented.  Mild left facial weakness with mild dysarthria.  Mild left inattention.  Able to follow one and two step commands without difficulty.  Motor: RUE/RLE: 5/5 proximal to distal LUE/LLE: 4-4+/5 proximal to distal Sensation intact to light touch Skin: Skin iswarmand dry.  Psychiatric: He has anormal mood and affect. Hisbehavior is normal.Thought contentnormal.    Assessment/Plan: 1. Functional deficits secondary to right insular, parietal and cerebellar infarcts which require 3+ hours per day of interdisciplinary therapy in a comprehensive  inpatient rehab setting. Physiatrist is providing close team supervision and 24 hour management of active medical problems listed below. Physiatrist and rehab team continue to assess barriers to discharge/monitor patient progress toward functional and medical goals.  Function:  Bathing Bathing position      Bathing parts      Bathing assist        Upper Body Dressing/Undressing Upper body dressing                    Upper body assist        Lower Body Dressing/Undressing Lower body dressing                                  Lower body assist        Toileting Toileting          Toileting assist     Transfers Chair/bed transfer             Locomotion Ambulation           Wheelchair          Cognition Comprehension    Expression    Social Interaction    Problem Solving    Memory      Medical Problem List and Plan: 1.Left hemiparesis and functional deficitssecondary to right insular, parietal and cerebellar infarcts    Begin CIR  Notes reviewed, images reviewed. 2. DVT Prophylaxis/Anticoagulation: Pharmaceutical:Lovenox 3. Pain Management:N/A 4. Mood:LCSW to follow for evaluation and support. 5. Neuropsych: This patientiscapable of making decisions on hisown behalf. 6. Skin/Wound Care:routine pressure relief measures 7. Fluids/Electrolytes/Nutrition:Intake poor due to food choices. Dietician to educate on [facility] diet. Liberalize diet for now.     BMP within acceptable range on 1/9 8. Dyslipidemia: now on Lipitor.  9. Seasonal allergies: Resumed Claritin (for zyrtec) and nose spray to help manage PND. 10. HTN: Monitor BP bid. Continue Norvasc.    Monitor with increased mobility 11. Question RUL nodule: Needs follow up CT chest for work up in the future. 12. Obesity: Dietician to educate on appropriate diet/food choices. Encourage weight loss to help decrease risk factors.   LOS (Days) 1 A FACE TO FACE EVALUATION  WAS PERFORMED  Ankit Karis Juba 02/18/2017 8:32 AM

## 2017-02-18 NOTE — Patient Care Conference (Signed)
Inpatient RehabilitationTeam Conference and Plan of Care Update Date: 02/18/2017   Time: 2:20 PM    Patient Name: [name]      Medical Record Number: [id number]  Date of Birth: 05-03-1965 Sex: Male         Room/Bed: 4M06C/4M06C-01 Payor Info: Payor: BLUE CROSS BLUE SHIELD / Plan: BCBS OTHER / Product Type: *No Product type* /    Admitting Diagnosis: CVA  Admit Date/Time:  02/17/2017  3:05 PM Admission Comments: No comment available   Primary Diagnosis:  <principal problem not specified> Principal Problem: <principal problem not specified>  Patient Active Problem List   Diagnosis Date Noted  . Gastroesophageal reflux disease   . Hyperlipidemia   . Morbid obesity ([facility])   . 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 Cryptogenic stroke ([facility]) 02/12/2017  . Allergic rhinitis 11/07/2016  . BMI 39.0-39.9,adult 11/07/2016  . Essential hypertension 10/18/2016    Expected Discharge Date: Expected Discharge Date: 02/28/17  Team Members Present: Physician leading conference: Dr. Maryla MorrowAnkit Patel Social Worker Present: Dossie DerBecky Abbee Cremeens, LCSW Nurse Present: Allayne Stackhelsey Evans, RN PT Present: Midge MiniumBen Zaino, PT OT Present: Perrin MalteseJames McGuire, OT SLP Present: Jackalyn LombardNicole Page, SLP PPS Coordinator present : Tora DuckMarie Noel, RN, CRRN     Current Status/Progress Goal Weekly Team Focus  Medical   Left hemiparesis and functional deficits secondary to right insular, parietal and cerebellar infarcts   Improve mobility, ADLs, BP  See above   Bowel/Bladder   Continent of B/B  Maintain continence with min assist  Assist with toileting as needed   Swallow/Nutrition/ Hydration             ADL's   min assist level overall for transfers to the shower and for LB selfcare sit to stand.  Decreased LUE functional use as well as sensory deficits.  supervision to modified independence  neuromuscular re-education, selfcare retraining, therapeutic exercise, balance retraining, pt/family education   Mobility   min assist  transfer, mod assist w/c propulsion, +2 gait x 50' (for safety due to body habitus and sensory deficits); stairs NT at evel  mod I basic and furniture transfers, supervision car; supervision w/c x 150' and gait x 150' and up/down 12 steps 2 rails and (1) 10" high step for home entry, LRAD  neuro re-ed, standing balance, mobility and locomotion, pt and family ed   Communication   mild dysarthria, supervision   mod I   intelligibility strategies    Safety/Cognition/ Behavioral Observations  supervision, higher level executive functioning deficits   mod I   med and financial management    Pain   Denies pain  Pain < 2  Assess Qshift and PRN   Skin   Skin clean, dry, intact  Prevent infection and skin breakdown  Assess Q shift and PRN      *See Care Plan and progress notes for long and short-term goals.     Barriers to Discharge  Current Status/Progress Possible Resolutions Date Resolved   Physician    Medical stability     See above  Therapies, optimize BP meds      Nursing                  PT                    OT                  SLP  SW                Discharge Planning/Teaching Needs:    Home with wife who can take a FMLA and work form home for a short time.     Team Discussion:  Goals supervision level-new evaluation today. MD monitoring his BP issues. L-UE deficits from stroke. Speech higher level cognitive deficits. Currently min-mod level of assist. Benefit from neuro-psych seeing for coping.  Revisions to Treatment Plan:  DC 1/19    Continued Need for Acute Rehabilitation Level of Care: The patient requires daily medical management by a physician with specialized training in physical medicine and rehabilitation for the following conditions: Daily direction of a multidisciplinary physical rehabilitation program to ensure safe treatment while eliciting the highest outcome that is of practical value to the patient.: Yes Daily medical management of patient  stability for increased activity during participation in an intensive rehabilitation regime.: Yes Daily analysis of laboratory values and/or radiology reports with any subsequent need for medication adjustment of medical intervention for : Neurological problems;Blood pressure problems  Jalin Alicea, Lemar Livings 02/19/2017, 8:55 AM

## 2017-02-18 NOTE — Evaluation (Signed)
Speech Language Pathology Assessment and Plan  Patient Details  Name: [name] MRN: [id number] Date of Birth: 1965-06-08  SLP Diagnosis: Dysarthria;Cognitive Impairments  Rehab Potential: Excellent ELOS: 7-10 days     Today's Date: 02/18/2017 SLP Individual Time: 1110-1205 SLP Individual Time Calculation (min): 55 min   Problem List:  Patient Active Problem List   Diagnosis Date Noted  . Hyperlipidemia   . Morbid obesity ([place])   . Acute ischemic right MCA stroke ([place]) 02/17/2017  . Cryptogenic stroke ([place]) 02/12/2017  . Allergic rhinitis 11/07/2016  . BMI 39.0-39.9,adult 11/07/2016  . Essential hypertension 10/18/2016   Past Medical History:  Past Medical History:  Diagnosis Date  . Environmental allergies   . Fall from roof    with kidney trauma  . Hypertension 2018   Past Surgical History:  Past Surgical History:  Procedure Laterality Date  . LOOP RECORDER INSERTION N/A 02/16/2017   Procedure: LOOP RECORDER INSERTION;  Surgeon: Constance Haw, MD;  Location: [place] [facility];  Service: Cardiovascular;  Laterality: N/A;  . NO PAST SURGERIES    . TEE WITHOUT CARDIOVERSION N/A 02/16/2017   Procedure: TRANSESOPHAGEAL ECHOCARDIOGRAM (TEE);  Surgeon: Jerline Pain, MD;  Location[facility] ENDOSCOPY;  Service: Cardiovascular;  Laterality: N/A;    Assessment / Plan / Recommendation Clinical Impression   Jesus Gertsch IIIis a 52 y.o.malewith history of HTN who was admitted on 02/12/17 with left facial and left sided weakness with numbness.CT head negative with question areas of low density right cerebellum--recent or old infarcts. He received tPA and followMRI brain done showing "small areas of acute/early subacute cortical infarction in right posterior insula and right lateral parietal lobeas well as very small right superior cerebellar subacute infarct".UDS negative and hypercoagulopathy panelpositive for lupus anticoagulant--question to be repeated. Dr. Erlinda Hong  recommended work up for embolic source and TEEwas negative for PFO or thrombus. EF 60-65% with no wall abnormality and loop recorder placed01/7. On ASA for embolic strokedue to unknown source. CTA head/neck without large for medium vessel occlusion, no carotid stenosis and showed indistinct nodular shadow RUL--CT chest recommended at some point. BLE dopplers negative for DVT. Therapy ongoing and patient with limited by left sided weakness with sensory deficits, mild dysarthria, higher level cognitive deficits, balance deficits. CIR recommended due to functional deficits.  Pt admitted to CIR and SLP evaluation completed on 02/18/2017 with the following results:  Pt presents with mild dysarthria characterized by rapid rushes of speech and imprecise articulation of consonants which are attributable to pt's left sided oral motor weakness.  As a result, pt has decreased intelligibility at the conversational level in noisy distracting environments.  Pt also has mild higher level executive functioning deficits and needed supervision to complete tasks.  Therefore, pt would benefit from skilled ST while inpatient in order to maximize functional independence and reduce burden of care prior to discharge.  Anticipate that pt will need OP ST follow up services after discharge to facilitate return to previous level of function, specifically return to work.      Skilled Therapeutic Interventions          Cognitive-linguistic evaluation completed with education provided to pt regarding results and plan of care.    SLP Assessment  Patient will need skilled [place] [facility] during CIR admission    Recommendations  Recommendations for Other Services: Neuropsych consult Patient destination: Home Follow up Recommendations: Outpatient SLP Equipment Recommended: None recommended by SLP    SLP Frequency 3 to 5 out  of 7 days   SLP Duration  SLP Intensity  SLP Treatment/Interventions 7-10 days    Minumum of 1-2 x/day, 30 to 90 minutes  Cognitive remediation/compensation;Cueing hierarchy;Environmental controls;Functional tasks;Internal/external aids;Patient/family education;Speech/Language facilitation    Pain Pain Assessment Pain Assessment: No/denies pain  Prior Functioning Cognitive/Linguistic Baseline: Within functional limits Type of Home: House  Lives With: Spouse Available Help at Discharge: Family;Available 24 hours/day Vocation: Full time employment  Function:  Eating Eating   Modified Consistency Diet: No Eating Assist Level: No help, No cues           Cognition Comprehension Comprehension assist level: Follows basic conversation/direction with no assist  Expression   Expression assist level: Expresses basic needs/ideas: With no assist  Social Interaction Social Interaction assist level: Interacts appropriately 90% of the time - Needs monitoring or encouragement for participation or interaction.  Problem Solving Problem solving assist level: Solves basic 90% of the time/requires cueing < 10% of the time  Memory Memory assist level: Recognizes or recalls 90% of the time/requires cueing < 10% of the time   Short Term Goals: Week 1: SLP Short Term Goal 1 (Week 1): STG=LTG due to ELOS   Refer to Care Plan for Long Term Goals  Recommendations for other services: Neuropsych  Discharge Criteria: Patient will be discharged from SLP if patient refuses treatment 3 consecutive times without medical reason, if treatment goals not met, if there is a change in medical status, if patient makes no progress towards goals or if patient is discharged from hospital.  The above assessment, treatment plan, treatment alternatives and goals were discussed and mutually agreed upon: by patient  Emilio Math 02/18/2017, 12:35 PM

## 2017-02-18 NOTE — Progress Notes (Signed)
Patient information reviewed and entered into eRehab system by Keirsten Matuska, RN, CRRN, PPS Coordinator.  Information including medical coding and functional independence measure will be reviewed and updated through discharge.    

## 2017-02-18 NOTE — Care Management Note (Signed)
Inpatient Rehabilitation Center Individual Statement of Services  Patient Name:  [name]  Date:  02/18/2017  Welcome to the Inpatient Rehabilitation Center.  Our goal is to provide you with an individualized program based on your diagnosis and situation, designed to meet your specific needs.  With this comprehensive rehabilitation program, you will be expected to participate in at least 3 hours of rehabilitation therapies Monday-Friday, with modified therapy programming on the weekends.  Your rehabilitation program will include the following services:  Physical Therapy (PT), Occupational Therapy (OT), Speech Therapy (ST), 24 hour per day rehabilitation nursing, Therapeutic Recreaction (TR), Neuropsychology, Case Management (Social Worker), Rehabilitation Medicine, Nutrition Services and Pharmacy Services  Weekly team conferences will be held on Wednesday to discuss your progress.  Your Social Worker will talk with you frequently to get your input and to update you on team discussions.  Team conferences with you and your family in attendance may also be held.  Expected length of stay: 7-10 days Overall anticipated outcome: supervision level  Depending on your progress and recovery, your program may change. Your Social Worker will coordinate services and will keep you informed of any changes. Your Social Worker's name and contact numbers are listed  below.  The following services may also be recommended but are not provided by the Inpatient Rehabilitation Center:   Driving Evaluations  Home Health Rehabiltiation Services  Outpatient Rehabilitation Services  Vocational Rehabilitation   Arrangements will be made to provide these services after discharge if needed.  Arrangements include referral to agencies that provide these services.  Your insurance has been verified to be:  [employer] Your primary doctor is:  [name]  Pertinent information will be shared with your doctor and your  insurance company.  Social Worker:  [name], [name] [phone] or [phone]) [phone]  Information discussed with and copy given to patient by: [name], [name], 02/18/2017, 9:05 AM

## 2017-02-19 ENCOUNTER — Inpatient Hospital Stay (HOSPITAL_COMMUNITY): Payer: BLUE CROSS/BLUE SHIELD | Admitting: Physical Therapy

## 2017-02-19 ENCOUNTER — Inpatient Hospital Stay (HOSPITAL_COMMUNITY): Payer: BLUE CROSS/BLUE SHIELD | Admitting: Speech Pathology

## 2017-02-19 ENCOUNTER — Inpatient Hospital Stay (HOSPITAL_COMMUNITY): Payer: BLUE CROSS/BLUE SHIELD | Admitting: Occupational Therapy

## 2017-02-19 ENCOUNTER — Inpatient Hospital Stay (HOSPITAL_COMMUNITY): Payer: BLUE CROSS/BLUE SHIELD | Admitting: *Deleted

## 2017-02-19 DIAGNOSIS — K219 Gastro-esophageal reflux disease without esophagitis: Secondary | ICD-10-CM

## 2017-02-19 NOTE — Progress Notes (Signed)
Speech Language Pathology Daily Session Note  Patient Details  Name: Sharlett IlesChester Surette III MRN: [id number] Date of Birth: 07-16-1965  Today's Date: 02/19/2017 SLP Individual Time: 1130-1200 SLP Individual Time Calculation (min): 30 min  Short Term Goals: Week 1: SLP Short Term Goal 1 (Week 1): STG=LTG due to ELOS   Skilled Therapeutic Interventions: Skilled treatment session focused on cognitive goals. SLP facilitated session by providing supervision verbal cues for recall of his current medications and their functions and to self-monitor and correct errors during a complex medication management task. Suspect errors occurred due to patient attempting to divide attention between task and verbosity. Patient was ~90% intelligible at the conversation level with cues needed for a slow rate. Patient left upright in wheelchair with all needs within reach. Continue with current plan of care.      Function:   Cognition Comprehension Comprehension assist level: Follows basic conversation/direction with no assist  Expression   Expression assist level: Expresses basic needs/ideas: With no assist  Social Interaction Social Interaction assist level: Interacts appropriately with others with medication or extra time (anti-anxiety, antidepressant).  Problem Solving Problem solving assist level: Solves complex 90% of the time/cues < 10% of the time  Memory Memory assist level: Recognizes or recalls 90% of the time/requires cueing < 10% of the time    Pain No/Denies Pain   Therapy/Group: Individual Therapy  Abbegale Stehle 02/19/2017, 3:29 PM

## 2017-02-19 NOTE — Progress Notes (Signed)
Physical Therapy Session Note  Patient Details  Name: [name] MRN: [id number] Date of Birth: 07-23-1965  Today's Date: 02/19/2017 PT Individual Time: 1740-8144 PT Individual Time Calculation (min): 50 min   Short Term Goals: Week 1:  PT Short Term Goal 1 (Week 1): = LTGs due to ELOS  Skilled Therapeutic Interventions/Progress Updates:   Pt in w/c upon arrival and agreeable to therapy, no c/o pain. Worked on [employer] and gait training this session. Ambulated around unit in 100-150' bouts w/ min guard and verbal cues for gait pattern. Emphasis placed on L heel-to-toe pattern, decreased L hip IR throughout gait cycle, increased BOS, and increased L stance time. Worked on lateral weight shifting w/ forced use of LLE to bear weight while reaching laterally w/ RUE. Min assist for manual facilitation of weight shifting. Performed multiple sit<>stand transfers w/ forced use of LLE more than RLE for transfer, verbal cues for technique. Performed tandem walking w/ mod assist to maintain balance in 30' bouts. Seated rest breaks throughout session 2/2 fatigue, discussed LLE exercises pt can perform while seated in w/c in room including heel/toe rocks to promote improved heel-to-toe gait pattern. Returned to room and ended session in w/c, call bell within reach and all needs met.   Therapy Documentation Precautions:  Precautions Precautions: Fall Precaution Comments: left neglect/inattention Restrictions Weight Bearing Restrictions: No Vital Signs: Therapy Vitals Temp: 98.4 F (36.9 C) Temp Source: Oral Pulse Rate: 92 Resp: 18 BP: 116/80 Patient Position (if appropriate): Sitting Oxygen Therapy SpO2: 100 % O2 Device: Not Delivered Pain: Pain Assessment Pain Assessment: No/denies pain  See Function Navigator for Current Functional Status.   Therapy/Group: Individual Therapy  Airik Goodlin K Arnette 02/19/2017, 4:59 PM

## 2017-02-19 NOTE — Progress Notes (Signed)
[place] PHYSICAL MEDICINE & REHABILITATION     PROGRESS NOTE  Subjective/Complaints:  Patient seen sitting up this morning. He states he slept well overnight. He states he had a good first in therapies yesterday.  ROS: Denies CP, SOB, N/V/D.  Objective: Vital Signs: Blood pressure 119/83, pulse 91, temperature 98 F (36.7 C), temperature source Oral, resp. rate 16, height 5\' 11"  (1.803 m), weight 129 kg (284 lb 6.3 oz), SpO2 97 %. No results found. Recent Labs    02/18/17 0555  WBC 6.1  HGB 15.0  HCT 45.1  PLT 286   Recent Labs    02/18/17 0555  NA 138  K 3.8  CL 105  GLUCOSE 100*  BUN 25*  CREATININE 1.18  CALCIUM 9.1   CBG (last 3)  No results for input(s): GLUCAP in the last 72 hours.  Wt Readings from Last 3 Encounters:  02/18/17 129 kg (284 lb 6.3 oz)  02/12/17 128.2 kg (282 lb 10.1 oz)  11/26/16 129.6 kg (285 lb 12.8 oz)    Physical Exam:  BP 119/83 (BP Location: Right Arm)   Pulse 91   Temp 98 F (36.7 C) (Oral)   Resp 16   Ht 5\' 11"  (1.803 m)   Wt 129 kg (284 lb 6.3 oz)   SpO2 97%   BMI 39.66 kg/m  Constitutional: He appearswell-developedand Obese.  HENT: Normocephalicand atraumatic.  Eyes: EOMI. No discharge. Cardiovascular:RRR. No JVD. Respiratory:Breath sounds normal. Unlabored. GN:FAOZH sounds are normal. He exhibitsno distension.  Neurological: He isalertand oriented.  Left facial weakness with mild dysarthria.  Able to follow one and two step commands without difficulty.  Motor: RUE/RLE: 5/5 proximal to distal LUE/LLE: 4-4+/5 proximal to distal Sensation intact to light touch Skin: Skin iswarmand dry.  Psychiatric: He has anormal mood and affect. Hisbehavior is normal.Thought contentnormal.    Assessment/Plan: 1. Functional deficits secondary to right insular, parietal and cerebellar infarcts which require 3+ hours per day of interdisciplinary therapy in a comprehensive inpatient rehab setting. Physiatrist is  providing close team supervision and 24 hour management of active medical problems listed below. Physiatrist and rehab team continue to assess barriers to discharge/monitor patient progress toward functional and medical goals.  Function:  Bathing Bathing position   Position: Shower  Bathing parts Body parts bathed by patient: Right arm, Left arm, Chest, Abdomen, Front perineal area, Buttocks, Right upper leg, Left upper leg, Left lower leg, Right lower leg Body parts bathed by helper: Back  Bathing assist        Upper Body Dressing/Undressing Upper body dressing   What is the patient wearing?: Pull over shirt/dress     Pull over shirt/dress - Perfomed by patient: Thread/unthread right sleeve, Thread/unthread left sleeve, Put head through opening, Pull shirt over trunk          Upper body assist Assist Level: Supervision or verbal cues      Lower Body Dressing/Undressing Lower body dressing   What is the patient wearing?: Pants, Underwear, Socks, Shoes Underwear - Performed by patient: Thread/unthread right underwear leg, Thread/unthread left underwear leg, Pull underwear up/down   Pants- Performed by patient: Thread/unthread right pants leg, Thread/unthread left pants leg, Pull pants up/down       Socks - Performed by patient: Don/doff right sock, Don/doff left sock   Shoes - Performed by patient: Don/doff right shoe, Don/doff left shoe Shoes - Performed by helper: Fasten right, Fasten left          Lower body assist  Toileting Toileting          Toileting assist     Transfers Chair/bed transfer   Chair/bed transfer method: Stand pivot Chair/bed transfer assist level: Touching or steadying assistance (Pt > 75%) Chair/bed transfer assistive device: Armrests     Locomotion Ambulation     Max distance: 50 Assist level: 2 helpers   Wheelchair   Type: Manual Max wheelchair distance: 150 Assist Level: Moderate assistance (Pt 50 - 74%)   Cognition Comprehension Comprehension assist level: Follows basic conversation/direction with no assist  Expression Expression assist level: Expresses basic needs/ideas: With no assist  Social Interaction Social Interaction assist level: Interacts appropriately with others with medication or extra time (anti-anxiety, antidepressant).  Problem Solving Problem solving assist level: Solves basic 75 - 89% of the time/requires cueing 10 - 24% of the time  Memory Memory assist level: Recognizes or recalls 90% of the time/requires cueing < 10% of the time    Medical Problem List and Plan: 1.Left hemiparesis and functional deficitssecondary to right insular, parietal and cerebellar infarcts    Continue CIR 2. DVT Prophylaxis/Anticoagulation: Pharmaceutical:Lovenox 3. Pain Management:N/A 4. Mood:LCSW to follow for evaluation and support. 5. Neuropsych: This patientiscapable of making decisions on hisown behalf. 6. Skin/Wound Care:routine pressure relief measures 7. Fluids/Electrolytes/Nutrition:Intake poor due to food choices. Di[facility]. Liberalize diet for now.     BMP within acceptable range on 1/9 8. Dyslipidemia: now on Lipitor.  9. Seasonal allergies: Resumed Claritin (for zyrtec) and nose spray to help manage PND. 10. HTN: Monitor BP bid. Continue Norvasc 10, lisinopril 20.    Controlled on 1/10 11. Question RUL nodule: Needs follow up CT chest for work up in the future. 12. Obesity: Dietician to educate on appropriate diet/food choices. Encourage weight loss to help decrease risk factors. 13. GERD: Cont Pepcid 20   LOS (Days) 2 A FACE TO FACE EVALUATION WAS PERFORMED  Ankit Karis Jubanil Patel 02/19/2017 8:36 AM

## 2017-02-19 NOTE — Progress Notes (Signed)
Occupational Therapy Session Note  Patient Details  Name: Sharlett IlesChester Dehoyos III MRN: [id number] Date of Birth: [birth year]  Today's Date: 02/19/2017 OT Individual Time: [id number] OT Individual Time Calculation (min): 58 min    Short Term Goals: Week 1:  OT Short Term Goal 1 (Week 1): Pt will complete LB bathing sit to stand with supervision. OT Short Term Goal 2 (Week 1): Pt will complete LB dressing sit to stand with supervision.  OT Short Term Goal 3 (Week 1): Pt will perform toilet transfers with supervision using RW.  OT Short Term Goal 4 (Week 1): Pt will perform walk-in shower transfers with supervision using the RW.    Skilled Therapeutic Interventions/Progress Updates:    Pt worked on eating breakfast to start session.  He was encouraged to integrate the LUE into task for holding his biscuit and his orange juice.  When doing this instead of bringing the left hand up to his mouth he would instead attempted to flex his trunk and head forward to meet the hand instead.  Mod demonstrational cueing with min assist complete self feeding with the LUE.  Attempted to get foam grips to place over utensils, but did not have any available.  Next had pt ambulate to gather clothing for dressing tasks.  He needed min guard assist without assistive device to complete this.  One LOB noted when returning to the chair and attempting to sit, secondary to placing the LUE on the chair arm and it sliding off resulting in fall into the chair.  Pt completed all UB dressing with supervision and mod instructional cueing to maintain attention to the left side.  LB dressing with min assist secondary to sit to stand and pt being unable to tie his shoes.  Therapist issued shoe buttons for compensation and pt was able to return demonstrate appropriate use.  Educated pt on FM coordination task to be completed outside of therapy.  Pt was left in wheelchair at bedside with call button and phone in reach and TR coming in for next  session.   Therapy Documentation Precautions:  Precautions Precautions: Fall Precaution Comments: left neglect/inattention Restrictions Weight Bearing Restrictions: No  Pain: Pain Assessment Pain Assessment: No/denies pain ADL: See Function Navigator for Current Functional Status.   Therapy/Group: Individual Therapy  Vernelle Wisner OTR/L 02/19/2017, 9:43 AM

## 2017-02-19 NOTE — Evaluation (Signed)
Recreational Therapy Assessment and Plan  Patient Details  Name: [name] MRN: [id number] Date of Birth: 02/01/1966 Today's Date: 02/19/2017  Rehab Potential: Good ELOS: 10 days   Assessment Problem List:      Patient Active Problem List   Diagnosis Date Noted  . Hyperlipidemia   . Morbid obesity ([place])   . Acute ischemic right MCA stroke ([place]) 02/17/2017  . Cryptogenic stroke ([place]) 02/12/2017  . Allergic rhinitis 11/07/2016  . BMI 39.0-39.9,adult 11/07/2016  . Essential hypertension 10/18/2016    Past Medical History:      Past Medical History:  Diagnosis Date  . Environmental allergies   . Fall from roof    with kidney trauma  . Hypertension 2018   Past Surgical History:       Past Surgical History:  Procedure Laterality Date  . LOOP RECORDER INSERTION N/A 02/16/2017   Procedure: LOOP RECORDER INSERTION;  Surgeon: Constance Haw, MD;  Location: [place] [facility];  Service: Cardiovascular;  Laterality: N/A;  . NO PAST SURGERIES    . TEE WITHOUT CARDIOVERSION N/A 02/16/2017   Procedure: TRANSESOPHAGEAL ECHOCARDIOGRAM (TEE);  Surgeon: Jerline Pain, MD;  Location: [facility] ENDOSCOPY;  Service: Cardiovascular;  Laterality: N/A;    Assessment & Plan Clinical Impression: Patient is a 52 y.o. year old male with recent admission to the hospital on 02/12/17 with left facial and left sided weakness with numbness.CT head negative with question areas of low density right cerebellum--recent or old infarcts. He received tPA and followMRI brain done showing "small areas of acute/early subacute cortical infarction in right posterior insula and right lateral parietal lobeas well as very small right superior cerebellar subacute infarct".  Patient transferred to CIR on 02/17/2017.  Pt presents with decreased activity tolerance, decreased functional mobility, decreased balance, decreased attention to left, decreased problem solving and decreased memory Limiting pt's  independence with leisure/community pursuits.   Leisure History/Participation Premorbid leisure interest/current participation: Community - Building control surveyor - Travel (Comment);Community - Glass blower/designer - Other (Comment)(attends hockey games and [employer] women's basketball games regulartly) Expression Interests: Music (Comment) Other Leisure Interests: Television;Movies;Videogames;Reading;Computer;Cooking/Baking Leisure Participation Style: With Family/Friends;Alone Awareness of Community Resources: Excellent Psychosocial / Spiritual Stress Management: Good Patient agreeable to Pet Therapy: Yes Does patient have pets?: Yes(2 dogs) Social interaction - Mood/Behavior: Cooperative Academic librarian Appropriate for Education?: Yes Patient Agreeable to Outing?: Yes Recreational Therapy Orientation Orientation -Reviewed with patient: Available activity resources Strengths/Weaknesses Patient Strengths/Abilities: Willingness to participate;Active premorbidly Patient weaknesses: Physical limitations TR Patient demonstrates impairments in the following area(s): Edema;Endurance;Motor  Plan Rec Therapy Plan Is patient appropriate for Therapeutic Recreation?: Yes Rehab Potential: Good Treatment times per week: Min 1 TR session/group >25 minutes during LOS Estimated Length of Stay: 10 days TR Treatment/Interventions: Adaptive equipment instruction;Group participation (Comment);Therapeutic exercise;Wheelchair propulsion/positioning;1:1 session;Community reintegration;Recreation/leisure participation;UE/LE Chartered certified accountant training;Functional mobility training;Patient/family education;Therapeutic activities  Recommendations for other services: None   Discharge Criteria: Patient will be discharged from TR if patient refuses treatment 3 consecutive times without medical reason.  If treatment goals not met, if there is a change in medical status, if patient  makes no progress towards goals or if patient is discharged from hospital.  The above assessment, treatment plan, treatment alternatives and goals were discussed and mutually agreed upon: by patient  [place] 02/19/2017, 11:34 AM

## 2017-02-19 NOTE — Progress Notes (Signed)
Social Work Patient ID: [name], male   DOB: [birth year], 52 y.o.   MRN: [id number]  Met with pt to discuss team conference goals supervision level and target discharge date 1/19. He is pleased with the progress he has made thus far and is hoping to continue to make more while here. Will work on discharge needs, agreeable to OP therapies and [facility] is the closest to them. Will go ahead and make the referral to get the appointments together. Await equipment needs.

## 2017-02-19 NOTE — Progress Notes (Signed)
Occupational Therapy Session Note  Patient Details  Name: [name] MRN: [id number] Date of Birth: 06-Jun-1965  Today's Date: 02/19/2017 OT Individual Time: [id number] OT Individual Time Calculation (min): 45 min    Short Term Goals: Week 1:  OT Short Term Goal 1 (Week 1): Pt will complete LB bathing sit to stand with supervision. OT Short Term Goal 2 (Week 1): Pt will complete LB dressing sit to stand with supervision.  OT Short Term Goal 3 (Week 1): Pt will perform toilet transfers with supervision using RW.  OT Short Term Goal 4 (Week 1): Pt will perform walk-in shower transfers with supervision using the RW.    Skilled Therapeutic Interventions/Progress Updates:    Pt completed functional mobility to and from the therapy gym with min hand held assist during session.  He was able to work on LUE FM coordination tasks in sitting during session.  Therapist had pt work on picking up small foam pieces one at a time and place in cup.  He then progressed to picking up multiple pieces and translating from fingertip to palm before placing them in the cup.  Noted pt with occasional drops during this task.  Next had pt pick up 1" small pegs with the left hand, one at a time and place in peg board.  Pt only able to complete approximately 50% successfully without dropping and having to re-grasp them.  He could not place any using pinch and ulnar side of hand down, instead completing them with arm pronated and radial side down.  Finished session with functional mobility back to the room while having to hold the cup with foam pieces in his left hand.  Slow rate of speed with occasional LOB secondary to decreased left foot clearance.  Pt left in wheelchair per his choice with call button and phone in reach.   Therapy Documentation Precautions:  Precautions Precautions: Fall Precaution Comments: left neglect/inattention Restrictions Weight Bearing Restrictions: No  Pain: Pain Assessment Pain  Assessment: No/denies pain ADL: See Function Navigator for Current Functional Status.   Therapy/Group: Individual Therapy  Alexarae Oliva OTR/L 02/19/2017, 3:52 PM

## 2017-02-20 ENCOUNTER — Inpatient Hospital Stay (HOSPITAL_COMMUNITY): Payer: BLUE CROSS/BLUE SHIELD | Admitting: Physical Therapy

## 2017-02-20 ENCOUNTER — Inpatient Hospital Stay (HOSPITAL_COMMUNITY): Payer: BLUE CROSS/BLUE SHIELD | Admitting: Occupational Therapy

## 2017-02-20 ENCOUNTER — Inpatient Hospital Stay (HOSPITAL_COMMUNITY): Payer: BLUE CROSS/BLUE SHIELD | Admitting: Speech Pathology

## 2017-02-20 NOTE — Progress Notes (Signed)
Physical Therapy Session Note  Patient Details  Name: [name] MRN: [id number] Date of Birth: 06-17-1965  Today's Date: 02/20/2017 PT Individual Time: 2482-5003 PT Individual Time Calculation (min): 58 min   Short Term Goals: Week 1:  PT Short Term Goal 1 (Week 1): = LTGs due to ELOS  Skilled Therapeutic Interventions/Progress Updates:   Pt in w/c upon arrival and agreeable to therapy, no c/o pain. Ambulated around unit in 100-150' bouts w/ min guard fading to close supervision to increase functional endurance. Performed Berg Balance Scale as detailed below and explained results to pt. Worked on dynamic standing balance on both firm and compliant surfaces w/ min guard to close supervision while performing bimanual tasks. Emphasis placed on maintaining balance in L single leg stance and utilizing hip, knee, and ankle strategies. Performed alternating step tapping w/ both 3" and 6" step, both from firm and compliant surfaces, to increase balance on L single leg stance and increase L hip and knee flexion w/ stepping. Min guard for safety, 1 LOB corrected w/ sitting to mat. Ambulated 300' on way back to room, verbal cues for gait pattern, to increase functional endurance. Ended session in w/c, call bell within reach and all needs met.   Therapy Documentation Precautions:  Precautions Precautions: Fall Precaution Comments: left neglect/inattention Restrictions Weight Bearing Restrictions: No Pain:   Balance: Standardized Balance Assessment Standardized Balance Assessment: Berg Balance Test Berg Balance Test Sit to Stand: Able to stand without using hands and stabilize independently Standing Unsupported: Able to stand 2 minutes with supervision Sitting with Back Unsupported but Feet Supported on Floor or Stool: Able to sit safely and securely 2 minutes Stand to Sit: Sits safely with minimal use of hands Transfers: Able to transfer safely, minor use of hands Standing Unsupported  with Eyes Closed: Able to stand 10 seconds with supervision Standing Ubsupported with Feet Together: Able to place feet together independently and stand for 1 minute with supervision From Standing, Reach Forward with Outstretched Arm: Can reach confidently >25 cm (10") From Standing Position, Pick up Object from Floor: Able to pick up shoe, needs supervision From Standing Position, Turn to Look Behind Over each Shoulder: Looks behind from both sides and weight shifts well Turn 360 Degrees: Able to turn 360 degrees safely in 4 seconds or less Standing Unsupported, Alternately Place Feet on Step/Stool: Able to complete >2 steps/needs minimal assist Standing Unsupported, One Foot in Front: Able to take small step independently and hold 30 seconds Standing on One Leg: Able to lift leg independently and hold equal to or more than 3 seconds Total Score: 45  See Function Navigator for Current Functional Status.   Therapy/Group: Individual Therapy  [name] 02/20/2017, 3:19 PM

## 2017-02-20 NOTE — Progress Notes (Signed)
Speech Language Pathology Daily Session Note  Patient Details  Name: Sharlett IlesChester Wickes III MRN: [id number] Date of Birth: [birth year]  Today's Date: 02/20/2017 SLP Individual Time: [id number] SLP Individual Time Calculation (min): 41 min  Short Term Goals: Week 1: SLP Short Term Goal 1 (Week 1): STG=LTG due to ELOS   Skilled Therapeutic Interventions:  Pt was seen for skilled ST targeting cognitive goals.  Pt was able to complete daily math calculations for 100% accuracy with mod I.  He needed supervision verbal cues to recognize and correct errors when completing math calculations for balancing a checkbook with 100% accuracy.  SLP also facilitated the session with a deductive reasoning puzzle to address higher level problem solving/executive functioning.  Pt needed supervision verbal cues for planning and task organization to complete task for 100% accuracy.  SLP discussed the need for extra planning and organization prior to initiation of tasks to compensate for changes post CVA and maximize safety and independence with tasks.  Pt was returned to room and left in wheelchair with call bell within reach.    Function:  Eating Eating     Eating Assist Level: Supervision or verbal cues           Cognition Comprehension Comprehension assist level: Follows basic conversation/direction with no assist  Expression   Expression assist level: Expresses basic needs/ideas: With no assist  Social Interaction Social Interaction assist level: Interacts appropriately with others with medication or extra time (anti-anxiety, antidepressant).  Problem Solving Problem solving assist level: Solves basic problems with no assist  Memory Memory assist level: Recognizes or recalls 90% of the time/requires cueing < 10% of the time    Pain Pain Assessment Pain Assessment: No/denies pain  Therapy/Group: Individual Therapy  Kyce Ging, Melanee SpryNicole L 02/20/2017, 10:28 AM

## 2017-02-20 NOTE — Progress Notes (Signed)
Occupational Therapy Session Note  Patient Details  Name: [name] MRN: [id number] Date of Birth: [birth year]  Today's Date: 02/20/2017 OT Individual Time: 1450-1531 OT Individual Time Calculation (min): 41 min    Short Term Goals: Week 1:  OT Short Term Goal 1 (Week 1): Pt will complete LB bathing sit to stand with supervision. OT Short Term Goal 2 (Week 1): Pt will complete LB dressing sit to stand with supervision.  OT Short Term Goal 3 (Week 1): Pt will perform toilet transfers with supervision using RW.  OT Short Term Goal 4 (Week 1): Pt will perform walk-in shower transfers with supervision using the RW.    Skilled Therapeutic Interventions/Progress Updates:    Pt completed functional mobility to the therapy gym with supervision and no assistive device.  Once in the gym had pt work on [employer] coordination in standing using Connect Four game and nuts and bolts activity.  Pt was able to pick up single checkers and place in grid with supervision but demonstrated 70% accuracy when attempting to pick up 2 checkers, 1 at a time and placing them.  He would drop the second checker when attempting to put the first one in the grid.  The same was occurring with attempts of 3 checkers as well.  He was able to use the LUE to place medium sized nuts on bolts at shoulder level with increased time and 3-4 attempts per bolt to get the nut started.  Supplied pt with nut and bolt to work on in his room as well.  Finished session with ambulation back to the room and pt left in wheelchair at bedside with call button and phone in reach.      Therapy Documentation Precautions:  Precautions Precautions: Fall Precaution Comments: left neglect/inattention Restrictions Weight Bearing Restrictions: No  Pain: Pain Assessment Pain Assessment: No/denies pain ADL: See Function Navigator for Current Functional Status.   Therapy/Group: Individual Therapy  Draylon Mercadel OTR/L 02/20/2017, 3:51 PM

## 2017-02-20 NOTE — Progress Notes (Signed)
Occupational Therapy Session Note  Patient Details  Name: Sharlett IlesChester Roppolo III MRN: [id number] Date of Birth: [birth year]  Today's Date: 02/20/2017 OT Individual Time: 0800-0900 OT Individual Time Calculation (min): 60 min    Short Term Goals: Week 1:  OT Short Term Goal 1 (Week 1): Pt will complete LB bathing sit to stand with supervision. OT Short Term Goal 2 (Week 1): Pt will complete LB dressing sit to stand with supervision.  OT Short Term Goal 3 (Week 1): Pt will perform toilet transfers with supervision using RW.  OT Short Term Goal 4 (Week 1): Pt will perform walk-in shower transfers with supervision using the RW.    Skilled Therapeutic Interventions/Progress Updates:    Pt completed self feeding from sitting on EOB.  Integration of the LUE for 10% of holding his french toast as well as his milk, but he still relies on the dominant RUE for majority of self feeding.  Next had him ambulate with min assist to gather clothing for dressing after completion of shower.  He was able to use the LUE for 50% of task with mod instructional cueing to integrate it more.  He then completed toilet transfer and toileting, again with min assist before transferring to the shower bench for bathing.  Min questioning cueing for sequencing shower to wash hair thoroughly and complete all parts.  Dressing sit to stand from the wheelchair with min instructional cueing for pulling up pants and underpants on the left side.  Min guard assist for sit to stand and standing balance.  Pt left in wheelchair at bedside at end of session with call button and phone in reach.    Therapy Documentation Precautions:  Precautions Precautions: Fall Precaution Comments: left neglect/inattention Restrictions Weight Bearing Restrictions: No  Pain: Pain Assessment Pain Assessment: No/denies pain ADL: See Function Navigator for Current Functional Status.   Therapy/Group: Individual Therapy  Kory Panjwani OTR/L 02/20/2017,  9:44 AM

## 2017-02-20 NOTE — Progress Notes (Signed)
[place] PHYSICAL MEDICINE & REHABILITATION     PROGRESS NOTE  Subjective/Complaints:  Patient seen lying in bed this morning. He states he slept well overnight.  ROS: Denies CP, SOB, N/V/D.  Objective: Vital Signs: Blood pressure 124/80, pulse 83, temperature (!) 97.5 F (36.4 C), temperature source Oral, resp. rate 16, height 5\' 11"  (1.803 m), weight 129 kg (284 lb 6.3 oz), SpO2 97 %. No results found. Recent Labs    02/18/17 0555  WBC 6.1  HGB 15.0  HCT 45.1  PLT 286   Recent Labs    02/18/17 0555  NA 138  K 3.8  CL 105  GLUCOSE 100*  BUN 25*  CREATININE 1.18  CALCIUM 9.1   CBG (last 3)  No results for input(s): GLUCAP in the last 72 hours.  Wt Readings from Last 3 Encounters:  02/18/17 129 kg (284 lb 6.3 oz)  02/12/17 128.2 kg (282 lb 10.1 oz)  11/26/16 129.6 kg (285 lb 12.8 oz)    Physical Exam:  BP 124/80 (BP Location: Right Arm)   Pulse 83   Temp (!) 97.5 F (36.4 C) (Oral)   Resp 16   Ht 5\' 11"  (1.803 m)   Wt 129 kg (284 lb 6.3 oz)   SpO2 97%   BMI 39.66 kg/m  Constitutional: He appearswell-developedand Obese.  HENT: Normocephalicand atraumatic.  Eyes: EOMI. No discharge. Cardiovascular:RRR. No JVD. Respiratory:Breath sounds normal. Unlabored. ZO:XWRUE sounds are normal. He exhibitsno distension.  Neurological: He isalertand oriented.  Left facial weakness with mild dysarthria.  Able to follow one and two step commands without difficulty.  Motor: RUE/RLE: 5/5 proximal to distal LUE/LLE: 4+/5 proximal to distal Skin: Skin iswarmand dry.  Psychiatric: He has anormal mood and affect. Hisbehavior is normal.Thought contentnormal.    Assessment/Plan: 1. Functional deficits secondary to right insular, parietal and cerebellar infarcts which require 3+ hours per day of interdisciplinary therapy in a comprehensive inpatient rehab setting. Physiatrist is providing close team supervision and 24 hour management of active medical  problems listed below. Physiatrist and rehab team continue to assess barriers to discharge/monitor patient progress toward functional and medical goals.  Function:  Bathing Bathing position   Position: Shower  Bathing parts Body parts bathed by patient: Right arm, Left arm, Chest, Abdomen, Front perineal area, Buttocks, Right upper leg, Left upper leg, Left lower leg, Right lower leg Body parts bathed by helper: Back  Bathing assist        Upper Body Dressing/Undressing Upper body dressing   What is the patient wearing?: Pull over shirt/dress     Pull over shirt/dress - Perfomed by patient: Thread/unthread right sleeve, Thread/unthread left sleeve, Put head through opening, Pull shirt over trunk          Upper body assist Assist Level: Supervision or verbal cues      Lower Body Dressing/Undressing Lower body dressing   What is the patient wearing?: Pants, Underwear, Socks, Shoes Underwear - Performed by patient: Thread/unthread right underwear leg, Thread/unthread left underwear leg, Pull underwear up/down   Pants- Performed by patient: Thread/unthread right pants leg, Thread/unthread left pants leg, Pull pants up/down       Socks - Performed by patient: Don/doff right sock, Don/doff left sock   Shoes - Performed by patient: Don/doff right shoe, Don/doff left shoe Shoes - Performed by helper: Fasten right, Fasten left          Lower body assist        Toileting Toileting  Toileting assist     Transfers Chair/bed transfer   Chair/bed transfer method: Ambulatory Chair/bed transfer assist level: Touching or steadying assistance (Pt > 75%) Chair/bed transfer assistive device: Armrests     Locomotion Ambulation     Max distance: 150' Assist level: Touching or steadying assistance (Pt > 75%)   Wheelchair   Type: Manual Max wheelchair distance: 150 Assist Level: Moderate assistance (Pt 50 - 74%)  Cognition Comprehension Comprehension assist  level: Follows basic conversation/direction with no assist  Expression Expression assist level: Expresses basic needs/ideas: With no assist  Social Interaction Social Interaction assist level: Interacts appropriately with others with medication or extra time (anti-anxiety, antidepressant).  Problem Solving Problem solving assist level: Solves complex 90% of the time/cues < 10% of the time  Memory Memory assist level: Recognizes or recalls 90% of the time/requires cueing < 10% of the time    Medical Problem List and Plan: 1.Left hemiparesis and functional deficitssecondary to right insular, parietal and cerebellar infarcts    Continue CIR 2. DVT Prophylaxis/Anticoagulation: Pharmaceutical:Lovenox 3. Pain Management:N/A 4. Mood:LCSW to follow for evaluation and support. 5. Neuropsych: This patientiscapable of making decisions on hisown behalf. 6. Skin/Wound Care:routine pressure relief measures 7. Fluids/Electrolytes/Nutrition:   Intake improving, eating about 75% meals   [facility]     BMP within acceptable range on 1/9   Labs ordered for Monday 8. Dyslipidemia: now on Lipitor.  9. Seasonal allergies: Resumed Claritin (for zyrtec) and nose spray to help manage PND. 10. HTN: Monitor BP bid. Continue Norvasc 10, lisinopril 20.    Controlled on 1/11 11. Question RUL nodule: Needs follow up CT chest for work up in the future. 12. Obesity: Dietician to educate on appropriate diet/food choices. Encourage weight loss to help decrease risk factors. 13. GERD: Cont Pepcid 20   LOS (Days) 3 A FACE TO FACE EVALUATION WAS PERFORMED  Dorian Duval Karis Jubanil Dilpreet Faires 02/20/2017 8:05 AM

## 2017-02-21 ENCOUNTER — Inpatient Hospital Stay (HOSPITAL_COMMUNITY): Payer: BLUE CROSS/BLUE SHIELD | Admitting: Physical Therapy

## 2017-02-21 ENCOUNTER — Inpatient Hospital Stay (HOSPITAL_COMMUNITY): Payer: BLUE CROSS/BLUE SHIELD | Admitting: Speech Pathology

## 2017-02-21 ENCOUNTER — Inpatient Hospital Stay (HOSPITAL_COMMUNITY): Payer: BLUE CROSS/BLUE SHIELD | Admitting: Occupational Therapy

## 2017-02-21 DIAGNOSIS — E639 Nutritional deficiency, unspecified: Secondary | ICD-10-CM | POA: Insufficient documentation

## 2017-02-21 NOTE — Progress Notes (Signed)
Physical Therapy Session Note  Patient Details  Name: [name] MRN: [id number] Date of Birth: Jun 17, 1965  Today's Date: 02/21/2017 PT Individual Time: 800-856 AND  1455-1536 PT Individual Time Calculation (min): 56 min AND 41 min   Short Term Goals: Week 1:  PT Short Term Goal 1 (Week 1): = LTGs due to ELOS  Skilled Therapeutic Interventions/Progress Updates:   Session 1:  Pt in w/c upon arrival and agreeable to therapy, no c/o pain. Ambulated 350' w/ close supervision to [place] and performed limits of stability training on biodex. 52% accuracy on intermediate level w/ UE support, 43% w/o UE support and 39% accuracy on beginner level w/o UE support. Min assist for manual cues for weight shifting, verbal cues to engage hip strategies. Ambulated in 100-150' bouts working on dynamic gait tasks, min guard for safety, scanning environment to pick up bean bags, kicking ball, carrying objects, performing head turns, and stepping over objects. Tossed ball w/ dowel rod on both firm and compliant surfaces w/ close supervision to work on dynamic balance. Performed 6 MWT test at end of session to assess endurance level, ambulated 936' w/ close supervision and mild increase in fatigue. Ended session in w/c, call bell within reach and all needs met.   Session 2: Pt in w/c and agreeable to therapy, no c/o pain. Ambulated to/from gym w/ supervision and occasional verbal cues for safety. Worked on dynamic standing balance while in gym w/ emphasis on trunk rotation while performing tasks w/ Wii bowling and tennis games and playing hockey. Verbal cues to encourage loosening up trunk for trunk rotation. Returned to room and ended session in w/c, call bell within reach and all needs met.   Therapy Documentation Precautions:  Precautions Precautions: Fall Precaution Comments: left neglect/inattention Restrictions Weight Bearing Restrictions: No Vital Signs:    See Function Navigator for Current  Functional Status.   Therapy/Group: Individual Therapy  Braelyn Bordonaro K Arnette 02/21/2017, 3:44 PM

## 2017-02-21 NOTE — Progress Notes (Signed)
Occupational Therapy Session Note  Patient Details  Name: [name] MRN: [id number] Date of Birth: [birth year]  Today's Date: 02/21/2017 OT Individual Time: 1315-1415 OT Individual Time Calculation (min): 60 min    Short Term Goals: Week 1:  OT Short Term Goal 1 (Week 1): Pt will complete LB bathing sit to stand with supervision. OT Short Term Goal 2 (Week 1): Pt will complete LB dressing sit to stand with supervision.  OT Short Term Goal 3 (Week 1): Pt will perform toilet transfers with supervision using RW.  OT Short Term Goal 4 (Week 1): Pt will perform walk-in shower transfers with supervision using the RW.    Skilled Therapeutic Interventions/Progress Updates: Patient focus this session = functional mobility ambulated without AD from/to room and kitchen.   Patient exhibited good balance to straighten and put away dishes to work on left upper extremity - focusing on digitial coordination and balance.  He also worked on other home skills such as making up the ADL apt. Bed and balance and endurance.  He was left seated in his w/c with call bell and phone within place.    Therapy Documentation Precautions:  Precautions Precautions: Fall Precaution Comments: left neglect/inattention Restrictions Weight Bearing Restrictions: No Pain:denied   Therapy/Group: Individual Therapy  Bud Faceickett, [name] 02/21/2017, 2:46 PM

## 2017-02-21 NOTE — Progress Notes (Signed)
Speech Language Pathology Daily Session Note  Patient Details  Name: [name] MRN: [id number] Date of Birth: [birth year]  Today's Date: 02/21/2017 SLP Individual Time: 0730-0800 SLP Individual Time Calculation (min): 30 min  Short Term Goals: Week 1: SLP Short Term Goal 1 (Week 1): STG=LTG due to ELOS   Skilled Therapeutic Interventions: Skilled treatment session focused on communication goals. SLP facilitated session by providing unknown conversational topics in quiet environment. Pt utilized speech intelligibility strategies with Mod I to achieve >95% intelligibility. Pt was left upright in wheelchair with all needs within reach.      Function:    Cognition Comprehension Comprehension assist level: Understands complex 90% of the time/cues 10% of the time  Expression   Expression assist level: Expresses complex 90% of the time/cues < 10% of the time  Social Interaction Social Interaction assist level: Interacts appropriately 75 - 89% of the time - Needs redirection for appropriate language or to initiate interaction.;Interacts appropriately 90% of the time - Needs monitoring or encouragement for participation or interaction.  Problem Solving Problem solving assist level: Solves basic 50 - 74% of the time/requires cueing 25 - 49% of the time;Solves basic 75 - 89% of the time/requires cueing 10 - 24% of the time  Memory Memory assist level: Recognizes or recalls 50 - 74% of the time/requires cueing 25 - 49% of the time    Pain    Therapy/Group: Individual Therapy  Darothy Courtright 02/21/2017, 8:03 AM

## 2017-02-21 NOTE — Progress Notes (Signed)
[facility] PHYSICAL MEDICINE & REHABILITATION     PROGRESS NOTE  Subjective/Complaints:  Patient seen sitting up at the edge of the bed this morning. He states he slept well overnight. He denies complaints.  ROS: Denies CP, SOB, N/V/D.  Objective: Vital Signs: Blood pressure 137/79, pulse 88, temperature 97.7 F (36.5 C), temperature source Oral, resp. rate 16, height 5\' 11"  (1.803 m), weight 129 kg (284 lb 6.3 oz), SpO2 96 %. No results found. No results for input(s): WBC, HGB, HCT, PLT in the last 72 hours. No results for input(s): NA, K, CL, GLUCOSE, BUN, CREATININE, CALCIUM in the last 72 hours.  Invalid input(s): CO CBG (last 3)  No results for input(s): GLUCAP in the last 72 hours.  Wt Readings from Last 3 Encounters:  02/18/17 129 kg (284 lb 6.3 oz)  02/12/17 128.2 kg (282 lb 10.1 oz)  11/26/16 129.6 kg (285 lb 12.8 oz)    Physical Exam:  BP 137/79 (BP Location: Right Leg)   Pulse 88   Temp 97.7 F (36.5 C) (Oral)   Resp 16   Ht 5\' 11"  (1.803 m)   Wt 129 kg (284 lb 6.3 oz)   SpO2 96%   BMI 39.66 kg/m  Constitutional: He appearswell-developedand Obese.  HENT: Normocephalicand atraumatic.  Eyes: EOMI. No discharge. Cardiovascular:RRR. No JVD. Respiratory:Breath sounds normal. Unlabored. WJ:XBJYNGI:Bowel sounds are normal. He exhibitsno distension.  Neurological: He isalertand oriented.  Left facial weakness with mild dysarthria.  Able to follow one and two step commands without difficulty.  Motor:  LUE/LLE: 4+/5 proximal to distal (stable) Skin: Skin iswarmand dry.  Psychiatric: He has anormal mood and affect. Hisbehavior is normal.Thought contentnormal.    Assessment/Plan: 1. Functional deficits secondary to right insular, parietal and cerebellar infarcts which require 3+ hours per day of interdisciplinary therapy in a comprehensive inpatient rehab setting. Physiatrist is providing close team supervision and 24 hour management of active medical  problems listed below. Physiatrist and rehab team continue to assess barriers to discharge/monitor patient progress toward functional and medical goals.  Function:  Bathing Bathing position   Position: Shower  Bathing parts Body parts bathed by patient: Right arm, Left arm, Chest, Abdomen, Front perineal area, Buttocks, Right upper leg, Left upper leg, Left lower leg, Right lower leg Body parts bathed by helper: Back  Bathing assist Assist Level: Supervision or verbal cues      Upper Body Dressing/Undressing Upper body dressing   What is the patient wearing?: Pull over shirt/dress     Pull over shirt/dress - Perfomed by patient: Thread/unthread right sleeve, Thread/unthread left sleeve, Put head through opening, Pull shirt over trunk          Upper body assist Assist Level: Supervision or verbal cues      Lower Body Dressing/Undressing Lower body dressing   What is the patient wearing?: Pants, Underwear, Socks, Shoes Underwear - Performed by patient: Thread/unthread right underwear leg, Thread/unthread left underwear leg, Pull underwear up/down   Pants- Performed by patient: Thread/unthread right pants leg, Thread/unthread left pants leg, Pull pants up/down       Socks - Performed by patient: Don/doff right sock, Don/doff left sock   Shoes - Performed by patient: Don/doff right shoe, Don/doff left shoe, Fasten right, Fasten left Shoes - Performed by helper: Fasten right, Fasten left          Lower body assist Assist for lower body dressing: Supervision or verbal cues      Toileting Toileting   Toileting steps  completed by patient: Adjust clothing prior to toileting, Performs perineal hygiene, Adjust clothing after toileting      Toileting assist Assist level: Touching or steadying assistance (Pt.75%)   Transfers Chair/bed transfer   Chair/bed transfer method: Ambulatory Chair/bed transfer assist level: Supervision or verbal cues Chair/bed transfer assistive  device: Armrests     Locomotion Ambulation     Max distance: >900' Assist level: Supervision or verbal cues   Wheelchair   Type: Manual Max wheelchair distance: 150 Assist Level: Moderate assistance (Pt 50 - 74%)  Cognition Comprehension Comprehension assist level: Understands complex 90% of the time/cues 10% of the time  Expression Expression assist level: Expresses complex 90% of the time/cues < 10% of the time  Social Interaction Social Interaction assist level: Interacts appropriately 75 - 89% of the time - Needs redirection for appropriate language or to initiate interaction., Interacts appropriately 90% of the time - Needs monitoring or encouragement for participation or interaction.  Problem Solving Problem solving assist level: Solves basic 50 - 74% of the time/requires cueing 25 - 49% of the time, Solves basic 75 - 89% of the time/requires cueing 10 - 24% of the time  Memory Memory assist level: Recognizes or recalls 50 - 74% of the time/requires cueing 25 - 49% of the time    Medical Problem List and Plan: 1.Left hemiparesis and functional deficitssecondary to right insular, parietal and cerebellar infarcts    Continue CIR 2. DVT Prophylaxis/Anticoagulation: Pharmaceutical:Lovenox 3. Pain Management:N/A 4. Mood:LCSW to follow for evaluation and support. 5. Neuropsych: This patientiscapable of making decisions on hisown behalf. 6. Skin/Wound Care:routine pressure relief measures 7. Fluids/Electrolytes/Nutrition:   Dietician to educate on [employer].    Intake remains variable    BMP within acceptable range on 1/9   Labs ordered for Monday 8. Dyslipidemia: now on Lipitor.  9. Seasonal allergies: Resumed Claritin (for zyrtec) and nose spray to help manage PND. 10. HTN: Monitor BP bid. Continue Norvasc 10, lisinopril 20.    Controlled on 1/12 11. Question RUL nodule: Needs follow up CT chest for work up in the future. 12. Obesity: Dietician to educate on  appropriate diet/food choices. Encourage weight loss to help decrease risk factors. 13. GERD: Cont Pepcid 20   LOS (Days) 4 A FACE TO FACE EVALUATION WAS PERFORMED  Ankit Karis Juba 02/21/2017 12:06 PM

## 2017-02-22 ENCOUNTER — Inpatient Hospital Stay (HOSPITAL_COMMUNITY): Payer: BLUE CROSS/BLUE SHIELD

## 2017-02-22 NOTE — Progress Notes (Signed)
Physical Therapy Session Note  Patient Details  Name: [name] MRN: [id number] Date of Birth: 1965/04/06  Today's Date: 02/22/2017 PT Individual Time: 0901-0959 PT Individual Time Calculation (min): 58 min   Short Term Goals: Week 1:  PT Short Term Goal 1 (Week 1): = LTGs due to ELOS  Skilled Therapeutic Interventions/Progress Updates:    Pt supine in bed upon PT arrival, agreeable to therapy tx and denies pain. Pt transferred from supine>sitting EOB with supervision. Pt ambulated from room>gym x 150 ft without AD with CGA. Pt worked on dynamic standing balance to perform toe taps on cones, toe taps to numbers on 6 inch step, side stepping, backwards stepping, stepping over objects, changes in gait speed and gait with head turns. Pt ascended/descended 12 steps using single handrail reciprocal pattern going up and step to pattern going down, CGA. Pt performed LE strengthening exercises standing at parallel bars, 2 x 10 each: hip abduction, hip extension, hamstring curls, calf raises and mini squats. Pt ambulated back to room and left seated in w/c with needs in reach.   Therapy Documentation Precautions:  Precautions Precautions: Fall Precaution Comments: left neglect/inattention Restrictions Weight Bearing Restrictions: No   See Function Navigator for Current Functional Status.   Therapy/Group: Individual Therapy  Cresenciano GenreEmily van Schagen, PT, DPT 02/22/2017, 7:52 AM

## 2017-02-22 NOTE — Progress Notes (Signed)
[facility] PHYSICAL MEDICINE & REHABILITATION     PROGRESS NOTE  Subjective/Complaints:  Patient seen lying in bed this morning. He states he slept well overnight. He asks about the weather outside.  ROS: Denies CP, SOB, N/V/D.  Objective: Vital Signs: Blood pressure 134/75, pulse 87, temperature 97.8 F (36.6 C), temperature source Oral, resp. rate 18, height 5\' 11"  (1.803 m), weight 129 kg (284 lb 6.3 oz), SpO2 94 %. No results found. No results for input(s): WBC, HGB, HCT, PLT in the last 72 hours. No results for input(s): NA, K, CL, GLUCOSE, BUN, CREATININE, CALCIUM in the last 72 hours.  Invalid input(s): CO CBG (last 3)  No results for input(s): GLUCAP in the last 72 hours.  Wt Readings from Last 3 Encounters:  02/18/17 129 kg (284 lb 6.3 oz)  02/12/17 128.2 kg (282 lb 10.1 oz)  11/26/16 129.6 kg (285 lb 12.8 oz)    Physical Exam:  BP 134/75 (BP Location: Right Arm)   Pulse 87   Temp 97.8 F (36.6 C) (Oral)   Resp 18   Ht 5\' 11"  (1.803 m)   Wt 129 kg (284 lb 6.3 oz)   SpO2 94%   BMI 39.66 kg/m  Constitutional: He appearswell-developedand Obese.  HENT: Normocephalicand atraumatic.  Eyes: EOMI. No discharge. Cardiovascular:RRR. No JVD. Respiratory:Breath sounds normal. Unlabored. ZO:XWRUE sounds are normal. He exhibitsno distension.  Neurological: He isalertand oriented.  Left facial weakness with mild dysarthria.  Able to follow one and two step commands without difficulty.  Motor:  LUE/LLE: 4+/5 proximal to distal (unchanged) Skin: Skin iswarmand dry.  Psychiatric: He has anormal mood and affect. Hisbehavior is normal.Thought contentnormal.    Assessment/Plan: 1. Functional deficits secondary to right insular, parietal and cerebellar infarcts which require 3+ hours per day of interdisciplinary therapy in a comprehensive inpatient rehab setting. Physiatrist is providing close team supervision and 24 hour management of active medical problems  listed below. Physiatrist and rehab team continue to assess barriers to discharge/monitor patient progress toward functional and medical goals.  Function:  Bathing Bathing position   Position: Shower  Bathing parts Body parts bathed by patient: Right arm, Left arm, Chest, Abdomen, Front perineal area, Buttocks, Right upper leg, Left upper leg, Left lower leg, Right lower leg Body parts bathed by helper: Back  Bathing assist Assist Level: Supervision or verbal cues      Upper Body Dressing/Undressing Upper body dressing   What is the patient wearing?: Pull over shirt/dress     Pull over shirt/dress - Perfomed by patient: Thread/unthread right sleeve, Thread/unthread left sleeve, Put head through opening, Pull shirt over trunk          Upper body assist Assist Level: Supervision or verbal cues      Lower Body Dressing/Undressing Lower body dressing   What is the patient wearing?: Pants, Underwear, Socks, Shoes Underwear - Performed by patient: Thread/unthread right underwear leg, Thread/unthread left underwear leg, Pull underwear up/down   Pants- Performed by patient: Thread/unthread right pants leg, Thread/unthread left pants leg, Pull pants up/down       Socks - Performed by patient: Don/doff right sock, Don/doff left sock   Shoes - Performed by patient: Don/doff right shoe, Don/doff left shoe, Fasten right, Fasten left Shoes - Performed by helper: Fasten right, Fasten left          Lower body assist Assist for lower body dressing: Supervision or verbal cues      Toileting Toileting   Toileting steps completed by  patient: Adjust clothing prior to toileting, Performs perineal hygiene, Adjust clothing after toileting      Toileting assist Assist level: Touching or steadying assistance (Pt.75%)   Transfers Chair/bed transfer   Chair/bed transfer method: Stand pivot Chair/bed transfer assist level: Touching or steadying assistance (Pt > 75%) Chair/bed transfer  assistive device: Armrests     Locomotion Ambulation     Max distance: >900' Assist level: Supervision or verbal cues   Wheelchair   Type: Manual Max wheelchair distance: 150 Assist Level: Moderate assistance (Pt 50 - 74%)  Cognition Comprehension Comprehension assist level: Follows basic conversation/direction with no assist  Expression Expression assist level: Expresses basic 90% of the time/requires cueing < 10% of the time.  Social Interaction Social Interaction assist level: Interacts appropriately 75 - 89% of the time - Needs redirection for appropriate language or to initiate interaction.  Problem Solving Problem solving assist level: Solves basic 75 - 89% of the time/requires cueing 10 - 24% of the time  Memory Memory assist level: Recognizes or recalls 90% of the time/requires cueing < 10% of the time    Medical Problem List and Plan: 1.Left hemiparesis and functional deficitssecondary to right insular, parietal and cerebellar infarcts    Continue CIR 2. DVT Prophylaxis/Anticoagulation: Pharmaceutical:Lovenox 3. Pain Management:N/A 4. Mood:LCSW to follow for evaluation and support. 5. Neuropsych: This patientiscapable of making decisions on hisown behalf. 6. Skin/Wound Care:routine pressure relief measures 7. Fluids/Electrolytes/Nutrition:   Dietician to educate on [employer] diet.    Intake remains variable on 1/13    BMP within acceptable range on 1/9   Labs ordered for tomorrow 8. Dyslipidemia: now on Lipitor.  9. Seasonal allergies: Resumed Claritin (for zyrtec) and nose spray to help manage PND. 10. HTN: Monitor BP bid. Continue Norvasc 10, lisinopril 20.    Controlled on 1/13 11. Question RUL nodule: Needs follow up CT chest for work up in the future. 12. Obesity: Dietician to educate on appropriate diet/food choices. Encourage weight loss to help decrease risk factors. 13. GERD: Cont Pepcid 20   LOS (Days) 5 A FACE TO FACE EVALUATION WAS  PERFORMED  Domani Bakos Karis Jubanil Makaiya Geerdes 02/22/2017 8:32 AM

## 2017-02-23 ENCOUNTER — Encounter (HOSPITAL_COMMUNITY): Payer: BLUE CROSS/BLUE SHIELD | Admitting: Psychology

## 2017-02-23 ENCOUNTER — Inpatient Hospital Stay (HOSPITAL_COMMUNITY): Payer: BLUE CROSS/BLUE SHIELD

## 2017-02-23 ENCOUNTER — Inpatient Hospital Stay (HOSPITAL_COMMUNITY): Payer: BLUE CROSS/BLUE SHIELD | Admitting: Occupational Therapy

## 2017-02-23 LAB — CBC WITH DIFFERENTIAL/PLATELET
Basophils Absolute: 0 10*3/uL (ref 0.0–0.1)
Basophils Relative: 0 %
Eosinophils Absolute: 0.1 10*3/uL (ref 0.0–0.7)
Eosinophils Relative: 2 %
HCT: 46.1 % (ref 39.0–52.0)
Hemoglobin: 15.8 g/dL (ref 13.0–17.0)
Lymphocytes Relative: 33 %
Lymphs Abs: 2.5 10*3/uL (ref 0.7–4.0)
MCH: 31.4 pg (ref 26.0–34.0)
MCHC: 34.3 g/dL (ref 30.0–36.0)
MCV: 91.7 fL (ref 78.0–100.0)
Monocytes Absolute: 0.6 10*3/uL (ref 0.1–1.0)
Monocytes Relative: 8 %
Neutro Abs: 4.3 10*3/uL (ref 1.7–7.7)
Neutrophils Relative %: 57 %
Platelets: 328 10*3/uL (ref 150–400)
RBC: 5.03 MIL/uL (ref 4.22–5.81)
RDW: 12.5 % (ref 11.5–15.5)
WBC: 7.5 10*3/uL (ref 4.0–10.5)

## 2017-02-23 LAB — BASIC METABOLIC PANEL
Anion gap: 11 (ref 5–15)
BUN: 28 mg/dL — ABNORMAL HIGH (ref 6–20)
CO2: 23 mmol/L (ref 22–32)
Calcium: 9.5 mg/dL (ref 8.9–10.3)
Chloride: 107 mmol/L (ref 101–111)
Creatinine, Ser: 1.23 mg/dL (ref 0.61–1.24)
GFR calc Af Amer: >60 mL/min (ref >60)
GFR calc non Af Amer: >60 mL/min (ref >60)
Glucose, Bld: 111 mg/dL — ABNORMAL HIGH (ref 65–99)
Potassium: 4 mmol/L (ref 3.5–5.1)
Sodium: 141 mmol/L (ref 135–145)

## 2017-02-23 NOTE — Progress Notes (Signed)
Occupational Therapy Session Note  Patient Details  Name: [name] Rzepka III MRN: [id number] Date of Birth: [birth year]  Today's Date: 02/23/2017 OT Individual Time: [id number] OT Individual Time Calculation (min): 59 min    Short Term Goals: Week 1:  OT Short Term Goal 1 (Week 1): Pt will complete LB bathing sit to stand with supervision. OT Short Term Goal 2 (Week 1): Pt will complete LB dressing sit to stand with supervision.  OT Short Term Goal 3 (Week 1): Pt will perform toilet transfers with supervision using RW.  OT Short Term Goal 4 (Week 1): Pt will perform walk-in shower transfers with supervision using the RW.    Skilled Therapeutic Interventions/Progress Updates:    Pt completed bathing and dressing sit to stand.  He was able to gather all clothing with functional mobility without assistive device with supervision.  He demonstrates some internal distraction from conversation and needs min instructional cueing for re-direction to task.  Bathing at an overall supervision level with use of the tub/shower bench.  Supervision for toilet transfer and toileting as well.  He completed dressing sit to stand with supervision as well.  Grooming tasks at supervision standing at the sink.  He still demonstrates decreased LUE functional deficits, and tends to use the dominant RUE for most tasks, even when both hands are needed.  Mod instructional cueing to integrate the LUE more.  Pt left in wheelchair at end of session with call button and phone in reach.    Therapy Documentation Precautions:  Precautions Precautions: Fall Precaution Comments: left neglect/inattention Restrictions Weight Bearing Restrictions: No  Pain: Pain Assessment Pain Assessment: No/denies pain ADL:  See Function Navigator for Current Functional Status.   Therapy/Group: Individual Therapy  Agam Davenport OTR/L 02/23/2017, 8:59 AM

## 2017-02-23 NOTE — Progress Notes (Signed)
[facility] PHYSICAL MEDICINE & REHABILITATION     PROGRESS NOTE  Subjective/Complaints:  Patient seen sitting up in bed in bed this morning. He states he slept well overnight. He is ready to resume his regular schedule.  ROS: Denies CP, SOB, N/V/D.  Objective: Vital Signs: Blood pressure 129/75, pulse 83, temperature 98.1 F (36.7 C), temperature source Oral, resp. rate 18, height 5\' 11"  (1.803 m), weight 129 kg (284 lb 6.3 oz), SpO2 95 %. No results found. No results for input(s): WBC, HGB, HCT, PLT in the last 72 hours. No results for input(s): NA, K, CL, GLUCOSE, BUN, CREATININE, CALCIUM in the last 72 hours.  Invalid input(s): CO CBG (last 3)  No results for input(s): GLUCAP in the last 72 hours.  Wt Readings from Last 3 Encounters:  02/18/17 129 kg (284 lb 6.3 oz)  02/12/17 128.2 kg (282 lb 10.1 oz)  11/26/16 129.6 kg (285 lb 12.8 oz)    Physical Exam:  BP 129/75 (BP Location: Right Arm)   Pulse 83   Temp 98.1 F (36.7 C) (Oral)   Resp 18   Ht 5\' 11"  (1.803 m)   Wt 129 kg (284 lb 6.3 oz)   SpO2 95%   BMI 39.66 kg/m  Constitutional: He appearswell-developedand Obese.  HENT: Normocephalicand atraumatic.  Eyes: EOMI. No discharge. Cardiovascular:RRR. No JVD. Respiratory:Breath sounds normal. unlabored. ZO:XWRUE sounds are normal. He exhibitsno distension.  Neurological: He isalertand oriented.  Left facial weakness with mild dysarthria.  Able to follow one and two step commands without difficulty.  Motor:  LUE/LLE: 4+/5 proximal to distal (LLE>LUE) Skin: Skin iswarmand dry.  Psychiatric: He has anormal mood and affect. Hisbehavior is normal.Thought contentnormal.    Assessment/Plan: 1. Functional deficits secondary to right insular, parietal and cerebellar infarcts which require 3+ hours per day of interdisciplinary therapy in a comprehensive inpatient rehab setting. Physiatrist is providing close team supervision and 24 hour management of active  medical problems listed below. Physiatrist and rehab team continue to assess barriers to discharge/monitor patient progress toward functional and medical goals.  Function:  Bathing Bathing position   Position: Shower  Bathing parts Body parts bathed by patient: Right arm, Left arm, Chest, Abdomen, Front perineal area, Buttocks, Right upper leg, Left upper leg, Left lower leg, Right lower leg Body parts bathed by helper: Back  Bathing assist Assist Level: Supervision or verbal cues      Upper Body Dressing/Undressing Upper body dressing   What is the patient wearing?: Pull over shirt/dress     Pull over shirt/dress - Perfomed by patient: Thread/unthread right sleeve, Thread/unthread left sleeve, Put head through opening, Pull shirt over trunk          Upper body assist Assist Level: Supervision or verbal cues      Lower Body Dressing/Undressing Lower body dressing   What is the patient wearing?: Pants, Underwear, Socks, Shoes Underwear - Performed by patient: Thread/unthread right underwear leg, Thread/unthread left underwear leg, Pull underwear up/down   Pants- Performed by patient: Thread/unthread right pants leg, Thread/unthread left pants leg, Pull pants up/down       Socks - Performed by patient: Don/doff right sock, Don/doff left sock   Shoes - Performed by patient: Don/doff right shoe, Don/doff left shoe, Fasten right, Fasten left Shoes - Performed by helper: Fasten right, Fasten left          Lower body assist Assist for lower body dressing: Supervision or verbal cues      Toileting Toileting  Toileting steps completed by patient: Adjust clothing prior to toileting, Performs perineal hygiene, Adjust clothing after toileting      Toileting assist Assist level: Touching or steadying assistance (Pt.75%)   Transfers Chair/bed transfer   Chair/bed transfer method: Stand pivot Chair/bed transfer assist level: Touching or steadying assistance (Pt >  75%) Chair/bed transfer assistive device: Armrests     Locomotion Ambulation     Max distance: 300 ft Assist level: Supervision or verbal cues   Wheelchair   Type: Manual Max wheelchair distance: 150 Assist Level: Moderate assistance (Pt 50 - 74%)  Cognition Comprehension Comprehension assist level: Follows basic conversation/direction with no assist  Expression Expression assist level: Expresses basic needs/ideas: With no assist  Social Interaction Social Interaction assist level: Interacts appropriately 75 - 89% of the time - Needs redirection for appropriate language or to initiate interaction.  Problem Solving Problem solving assist level: Solves basic problems with no assist  Memory Memory assist level: Recognizes or recalls 90% of the time/requires cueing < 10% of the time    Medical Problem List and Plan: 1.Left hemiparesis and functional deficitssecondary to right insular, parietal and cerebellar infarcts    Continue CIR 2. DVT Prophylaxis/Anticoagulation: Pharmaceutical:Lovenox 3. Pain Management:N/A 4. Mood:LCSW to follow for evaluation and support. 5. Neuropsych: This patientiscapable of making decisions on hisown behalf. 6. Skin/Wound Care:routine pressure relief measures 7. Fluids/Electrolytes/Nutrition:   Dietician to educate on [employer] diet.    Intake very poor on 1/13    BMP within acceptable range on 1/9   Labs ordered for pending 8. Dyslipidemia: now on Lipitor.  9. Seasonal allergies: Resumed Claritin (for zyrtec) and nose spray to help manage PND. 10. HTN: Monitor BP bid. Continue Norvasc 10, lisinopril 20.    Controlled on 1/14 11. Question RUL nodule: Needs follow up CT chest for work up in the future. 12. Obesity: Dietician to educate on appropriate diet/food choices. Encourage weight loss to help decrease risk factors. 13. GERD: Cont Pepcid 20   LOS (Days) 6 A FACE TO FACE EVALUATION WAS PERFORMED  Grady Mohabir Karis Jubanil Curly Mackowski 02/23/2017 8:19 AM

## 2017-02-23 NOTE — Plan of Care (Signed)
  Progressing Consults RH STROKE PATIENT EDUCATION Description See Patient Education module for education specifics  02/23/2017 1235 - Progressing by Dani Gobbleeardon, Jacqulene Huntley J, RN RH SKIN INTEGRITY RH STG ABLE TO PERFORM INCISION/WOUND CARE W/ASSISTANCE Description STG Able To Perform Incision/Wound Care With Mod I Assistance.  02/23/2017 1235 - Progressing by Dani Gobbleeardon, Anabel Lykins J, RN RH SAFETY RH STG ADHERE TO SAFETY PRECAUTIONS W/ASSISTANCE/DEVICE Description STG Adhere to Safety Precautions With min Assistance/Device.  02/23/2017 1235 - Progressing by Dani Gobbleeardon, Nazaire Cordial J, RN RH PAIN MANAGEMENT RH STG PAIN MANAGED AT OR BELOW PT'S PAIN GOAL Description <4 out of 10.   02/23/2017 1235 - Progressing by Dani Gobbleeardon, Cipriano Millikan J, RN RH KNOWLEDGE DEFICIT RH STG INCREASE KNOWLEDGE OF HYPERTENSION Description Pt will be able to identify 2 factors that affect blood pressure and list ways to manage blood pressure at the time of discharge.   02/23/2017 1235 - Progressing by Dani Gobbleeardon, Sascha Baugher J, RN

## 2017-02-23 NOTE — Progress Notes (Signed)
Physical Therapy Session Note  Patient Details  Name: [name] MRN: [id number] Date of Birth: 11-24-1965  Today's Date: 02/23/2017 PT Individual Time: [id number] PT Individual Time Calculation (min): 58 min   Short Term Goals: Week 1:  PT Short Term Goal 1 (Week 1): = LTGs due to ELOS  Skilled Therapeutic Interventions/Progress Updates:    Pt seated in w/c upon PT arrival, agreeable to therapy tx and denies pain. Pt ambulated from room>gym x 150 ft with supervision, no AD. Pt worked on standing balance this session: stand on foam eyes closed/feet apart, eyes closed/feet together (left posterior LOB), standing on foam while throwing horse shoes, standing on foam while completing pvc puzzle, ambulation with head turns, ambulation with changes in speed/sudden stops and toe taps on 6 inch step. Pt ambulated throughout unit >300 ft this session with CGA. Pt performed standing LE therex for strengthening, 2 x 10 each: calf raises, hip abduction, hamstring curls, sit<>stands. Pt ambulated back to room with CGA, no AD and left seated in w/c with needs in reach.   Therapy Documentation Precautions:  Precautions Precautions: Fall Precaution Comments: left neglect/inattention Restrictions Weight Bearing Restrictions: No   See Function Navigator for Current Functional Status.   Therapy/Group: Individual Therapy  Cresenciano GenreEmily van Schagen, PT, DPT 02/23/2017, 10:12 AM

## 2017-02-23 NOTE — Progress Notes (Signed)
Occupational Therapy Session Note  Patient Details  Name: [name] MRN: [id number] Date of Birth: 06/06/1965  Today's Date: 02/23/2017 OT Individual Time: [id number] OT Individual Time Calculation (min): 44 min    Short Term Goals: Week 1:  OT Short Term Goal 1 (Week 1): Pt will complete LB bathing sit to stand with supervision. OT Short Term Goal 2 (Week 1): Pt will complete LB dressing sit to stand with supervision.  OT Short Term Goal 3 (Week 1): Pt will perform toilet transfers with supervision using RW.  OT Short Term Goal 4 (Week 1): Pt will perform walk-in shower transfers with supervision using the RW.    Skilled Therapeutic Interventions/Progress Updates:    Pt completed functional mobility to the gym with supervision.  Occasional mis-step on the left but pt able to keep his balance and continue without assist.  Once in the gym had pt sit on the mat and work on catching a small ball with both hands and then tossing it back to the therapist with just the left hand.  He was able to consistently catch the ball but demonstrated some difficulty with accurate throwing with the left hand.  Progressed to having him toss it up with one hand and then catch it with the other.  He was able to demonstrate approximately 70% accuracy with this task.  Next had him completed nine hold peg test [facility].  He was able to complete it with the right hand in 22 seconds but needed 2 mins 30 seconds to complete only 3 pegs with the left hand.  Had him stand and dribble a medium sized ball with the left hand as well as walk back to the room while tossing the same ball to himself.  Noted slower gait speed and difficulty with divided attention, but no LOB.  Pt left in wheelchair at bedside with call button and phone in reach.    Therapy Documentation Precautions:  Precautions Precautions: Fall Precaution Comments: left neglect/inattention Restrictions Weight Bearing Restrictions:  No  Pain: No report of pain during session  See Function Navigator for Current Functional Status.   Therapy/Group: Individual Therapy  Ike Maragh OTR/L 02/23/2017, 4:00 PM

## 2017-02-23 NOTE — Progress Notes (Signed)
Speech Language Pathology Daily Session Note  Patient Details  Name: [name] MRN: [id number] Date of Birth: July 19, 1965  Today's Date: 02/23/2017 SLP Individual Time: 1300-1330 SLP Individual Time Calculation (min): 30 min  Short Term Goals: Week 1: SLP Short Term Goal 1 (Week 1): STG=LTG due to ELOS   Skilled Therapeutic Interventions: Skilled ST services focused on cognitive skills.Pt and wife had just finished paying bills online together, before ST entered room. Wife stated that pt dictated how to access accounts and navigating websites, while wife utilized computer, therefore requiring supervision A for functional semi-complex problem solving. Pt was in charge of all bills piror to hospitalization according to pt and wife.Pt and wife both stated dramatic change in pt's speech over the weekend, stating " back to normal." Pt demonstrated 90-95% intelligibility in conversation per skilled ST observation. SLP facilitated semi-complex problem solving given deductive reasoning tasks, pt recall piror strategies for problem solving with supervision A question cues and required supervision A verbal cues to complete task especially in organization of information and monitoring/correcting errors. Pt was left in room with call bell within reach. Recommend to continue skilled ST services.      Function:  Eating Eating                 Cognition Comprehension Comprehension assist level: Understands complex 90% of the time/cues 10% of the time  Expression   Expression assist level: Expresses complex 90% of the time/cues < 10% of the time  Social Interaction Social Interaction assist level: Interacts appropriately 90% of the time - Needs monitoring or encouragement for participation or interaction.  Problem Solving Problem solving assist level: Solves complex 90% of the time/cues < 10% of the time  Memory Memory assist level: Recognizes or recalls 90% of the time/requires cueing < 10% of  the time    Pain Pain Assessment Pain Assessment: No/denies pain  Therapy/Group: Individual Therapy  Janaria Mccammon  [facility] 02/23/2017, 4:04 PM

## 2017-02-23 NOTE — Consult Note (Signed)
Neuropsychological Consultation   Patient:   [name]   DOB:   07/05/1965  MR Number:  [id number]  [facility] [address] [facility] [address] [id number] [facility] [place] [id number] Dept: [phone] Loc: [phone]           Date of Service:   02/23/2017  Start Time:   9 AM End Time:   10 AM  Provider/Observer:  Arley PhenixJohn Egan Berkheimer, Psy.D.       Clinical Neuropsychologist       Billing Code/Service: [id number] 4 Units  Chief Complaint:    [name] is a 52 year old male with history of hypertension admitted on 02/12/2017 with left facial droop and left sided weakness with numbness.  He reports that he was walking and fell due to left sided weakness and got to the phone and called 911.  In ED within hour of event.  Received tPA.  MRI showed small areaa of acute/early subacute cortical infarction in right posterior insula and right lateral parietal lobe as well as very small right superior cerebellar subacute infarct.  Patient is having continuing but improvement in strength and coordination in left arm/hand and left leg.  Walking more.  Working hard in therapy.  Patient denies awareness of changes in cognitive function but does acknowledge some slurring in speech.  Memory and mental status good.  Reason for Service:  UVO:[id number]:Jesus Eldridge ScotBoyd IIIis a 51 y.o.malewith history of HTN who was admitted on 02/12/17 with left facial and left sided weakness with numbness.CT head negative with question areas of low density right cerebellum--recent or old infarcts. He received tPA and followMRI brain done showing "small areas of acute/early subacute cortical infarction in right posterior insula and right lateral parietal lobeas well as very small right superior cerebellar subacute infarct".UDS negative and hypercoagulopathy panelpositive for lupus anticoagulant--question to be repeated.  Dr. Roda ShuttersXu recommended work up for embolic  source and TEEwas negative for PFO or thrombus. EF 60-65% with no wall abnormality and loop recorder placed01/7. On ASA for embolic strokedue to unknown source. CTA head/neck without large for medium vessel occlusion, no carotid stenosis and showed indistinct nodular shadow RUL--CT chest recommended at some point. BLE dopplers negative for DVT. Therapy ongoing and patient with limited by left sided weakness with sensory deficits, mild dysarthria, higher level cognitive deficits, balance deficits. CIR recommended due to functional deficits.   Current Status:  Patient reports that his mood is good and that he is adjusting better regarding being in hospital and going through therapy.  Working hard in PT/OT.    Behavioral Observation: [name]  presents as a 52 y.o.-year-old Right Caucasian Male who appeared his stated age. his dress was Appropriate and he was Well Groomed and his manners were Appropriate to the situation.  his participation was indicative of Appropriate and Attentive behaviors.  There were physical disabilities noted.  he displayed an appropriate level of cooperation and motivation.     Interactions:    Active Appropriate and Attentive  Attention:   within normal limits and attention span and concentration were age appropriate  Memory:   within normal limits; recent and remote memory intact  Visuo-spatial:  not examined  Speech (Volume):  normal  Speech:   normal; slurred  Thought Process:  Coherent and Relevant  Though Content:  WNL; not suicidal  Orientation:   person, place, time/date and situation  Judgment:   Good  Planning:   Good  Affect:    Appropriate  Mood:    Euthymic  Insight:   Good  Intelligence:   normal  Medical History:   Past Medical History:  Diagnosis Date  . Environmental allergies   . Fall from roof    with kidney trauma  . Hypertension 2018        Family Med/Psych History:  Family History  Problem Relation Age of Onset   . Hypertension Mother   . Hypertension Father   . Stroke Father   . Hypertension Sister   . Leukemia Paternal Uncle     Risk of Suicide/Violence: virtually non-existent Pt denies either SI or HI  Impression/DX:  [name] is a 52 year old male with history of hypertension admitted on 02/12/2017 with left facial droop and left sided weakness with numbness.  He reports that he was walking and fell due to left sided weakness and got to the phone and called 911.  In ED within hour of event.  Received tPA.  MRI showed small areaa of acute/early subacute cortical infarction in right posterior insula and right lateral parietal lobe as well as very small right superior cerebellar subacute infarct.  Patient is having continuing but improvement in strength and coordination in left arm/hand and left leg.  Walking more.  Working hard in therapy.  Patient denies awareness of changes in cognitive function but does acknowledge some slurring in speech.  Memory and mental status good.  Patient reports that his mood is good and that he is adjusting better regarding being in hospital and going through therapy.  Working hard in PT/OT.   Diagnosis:    CVA        Electronically Signed   _______________________ Arley Phenix, Psy.D.

## 2017-02-24 ENCOUNTER — Ambulatory Visit (HOSPITAL_COMMUNITY): Payer: BLUE CROSS/BLUE SHIELD | Admitting: *Deleted

## 2017-02-24 ENCOUNTER — Inpatient Hospital Stay (HOSPITAL_COMMUNITY): Payer: BLUE CROSS/BLUE SHIELD

## 2017-02-24 ENCOUNTER — Inpatient Hospital Stay (HOSPITAL_COMMUNITY): Payer: BLUE CROSS/BLUE SHIELD | Admitting: Occupational Therapy

## 2017-02-24 ENCOUNTER — Inpatient Hospital Stay (HOSPITAL_COMMUNITY): Payer: BLUE CROSS/BLUE SHIELD | Admitting: Physical Therapy

## 2017-02-24 ENCOUNTER — Inpatient Hospital Stay (HOSPITAL_COMMUNITY): Payer: BLUE CROSS/BLUE SHIELD | Admitting: Speech Pathology

## 2017-02-24 DIAGNOSIS — R739 Hyperglycemia, unspecified: Secondary | ICD-10-CM

## 2017-02-24 LAB — CBC
HCT: 42.1 % (ref 39.0–52.0)
Hemoglobin: 14.4 g/dL (ref 13.0–17.0)
MCH: 31.4 pg (ref 26.0–34.0)
MCHC: 34.2 g/dL (ref 30.0–36.0)
MCV: 91.7 fL (ref 78.0–100.0)
Platelets: 301 10*3/uL (ref 150–400)
RBC: 4.59 MIL/uL (ref 4.22–5.81)
RDW: 12.5 % (ref 11.5–15.5)
WBC: 7.2 10*3/uL (ref 4.0–10.5)

## 2017-02-24 NOTE — Progress Notes (Signed)
Occupational Therapy Note  Patient Details  Name: Sharlett IlesChester Zappone III MRN: [id number] Date of Birth: November 12, 1965  Today's Date: 02/24/2017 OT Concurrent Time: 1300-1355 OT Concurrent Time Calculation (min): 55 min  Pt denies pain Individual Therapy  Cotx with Recreational Therapy.  OT intervention with focus on functional amb in community setting, problem solving, energy conservation, and discharging planning.  Pt amb without AD (supervision) from [facility] [place] to cafe on 2nd floor without rest break.  Pt returned to 4th floor after extended rest break on 2 nd floor.  Engaged pt in discussion of discharge and home safety considerations. Discussed energy conservation strategies in community setting.  Pt amb back to room without rest break.     Lavone NeriLanier, [facility]ell 02/24/2017, 2:44 PM

## 2017-02-24 NOTE — Progress Notes (Signed)
[place] PHYSICAL MEDICINE & REHABILITATION     PROGRESS NOTE  Subjective/Complaints:  Pt seen lying in bed this AM.  He states he slept well overnight.  He states he is going to be discharged on Thursday now.   ROS: Denies CP, SOB, N/V/D.  Objective: Vital Signs: Blood pressure 129/78, pulse 92, temperature 98.7 F (37.1 C), temperature source Oral, resp. rate 20, height 5\' 11"  (1.803 m), weight 129 kg (284 lb 6.3 oz), SpO2 96 %. No results found. Recent Labs    02/23/17 0741 02/24/17 0504  WBC 7.5 7.2  HGB 15.8 14.4  HCT 46.1 42.1  PLT 328 301   Recent Labs    02/23/17 0741  NA 141  K 4.0  CL 107  GLUCOSE 111*  BUN 28*  CREATININE 1.23  CALCIUM 9.5   CBG (last 3)  No results for input(s): GLUCAP in the last 72 hours.  Wt Readings from Last 3 Encounters:  02/18/17 129 kg (284 lb 6.3 oz)  02/12/17 128.2 kg (282 lb 10.1 oz)  11/26/16 129.6 kg (285 lb 12.8 oz)    Physical Exam:  BP 129/78 (BP Location: Right Arm)   Pulse 92   Temp 98.7 F (37.1 C) (Oral)   Resp 20   Ht 5\' 11"  (1.803 m)   Wt 129 kg (284 lb 6.3 oz)   SpO2 96%   BMI 39.66 kg/m  Constitutional: He appearswell-developedand Obese.  HENT: Normocephalicand atraumatic.  Eyes: EOMI. No discharge. Cardiovascular:RRR. No JVD. Respiratory:Breath sounds normal. Unlabored. ZO:XWRUEGI:Bowel sounds are normal. He exhibitsno distension.  Neurological: He isalertand oriented.  Left facial weakness with mild dysarthria.  Able to follow one and two step commands without difficulty.  Motor:  LUE/LLE: 4+/5 proximal to distal (LLE>LUE, stable) Skin: Skin iswarmand dry.  Psychiatric: He has anormal mood and affect. Hisbehavior is normal.Thought contentnormal.    Assessment/Plan: 1. Functional deficits secondary to right insular, parietal and cerebellar infarcts which require 3+ hours per day of interdisciplinary therapy in a comprehensive inpatient rehab setting. Physiatrist is providing close team  supervision and 24 hour management of active medical problems listed below. Physiatrist and rehab team continue to assess barriers to discharge/monitor patient progress toward functional and medical goals.  Function:  Bathing Bathing position   Position: Shower  Bathing parts Body parts bathed by patient: Right arm, Left arm, Chest, Abdomen, Front perineal area, Buttocks, Right upper leg, Left upper leg, Left lower leg, Right lower leg Body parts bathed by helper: Back  Bathing assist Assist Level: Supervision or verbal cues      Upper Body Dressing/Undressing Upper body dressing   What is the patient wearing?: Pull over shirt/dress     Pull over shirt/dress - Perfomed by patient: Thread/unthread right sleeve, Thread/unthread left sleeve, Put head through opening, Pull shirt over trunk          Upper body assist Assist Level: Supervision or verbal cues      Lower Body Dressing/Undressing Lower body dressing   What is the patient wearing?: Pants, Underwear, Socks, Shoes Underwear - Performed by patient: Thread/unthread right underwear leg, Thread/unthread left underwear leg, Pull underwear up/down   Pants- Performed by patient: Thread/unthread right pants leg, Thread/unthread left pants leg, Pull pants up/down       Socks - Performed by patient: Don/doff right sock, Don/doff left sock   Shoes - Performed by patient: Don/doff right shoe, Don/doff left shoe Shoes - Performed by helper: Fasten right, Fasten left  Lower body assist Assist for lower body dressing: Supervision or verbal cues      Toileting Toileting   Toileting steps completed by patient: Adjust clothing prior to toileting, Performs perineal hygiene, Adjust clothing after toileting Toileting steps completed by helper: Adjust clothing prior to toileting, Adjust clothing after toileting(per Josem Kaufmann, NT)    Toileting assist Assist level: Supervision or verbal cues   Transfers Chair/bed  transfer   Chair/bed transfer method: Stand pivot, Ambulatory Chair/bed transfer assist level: Supervision or verbal cues Chair/bed transfer assistive device: Armrests     Locomotion Ambulation     Max distance: >300 ft Assist level: Touching or steadying assistance (Pt > 75%)   Wheelchair   Type: Manual Max wheelchair distance: 150 Assist Level: Moderate assistance (Pt 50 - 74%)  Cognition Comprehension Comprehension assist level: Understands basic 90% of the time/cues < 10% of the time  Expression Expression assist level: Expresses basic 90% of the time/requires cueing < 10% of the time.  Social Interaction Social Interaction assist level: Interacts appropriately 90% of the time - Needs monitoring or encouragement for participation or interaction.  Problem Solving Problem solving assist level: Solves basic 90% of the time/requires cueing < 10% of the time  Memory Memory assist level: Recognizes or recalls 90% of the time/requires cueing < 10% of the time    Medical Problem List and Plan: 1.Left hemiparesis and functional deficitssecondary to right insular, parietal and cerebellar infarcts    Continue CIR 2. DVT Prophylaxis/Anticoagulation: Pharmaceutical:Lovenox   Plts WNL on 1/15 3. Pain Management:N/A 4. Mood:LCSW to follow for evaluation and support. 5. Neuropsych: This patientiscapable of making decisions on hisown behalf. 6. Skin/Wound Care:routine pressure relief measures 7. Fluids/Electrolytes/Nutrition:   Dietician to educate on [employer].    Intake variable     BMP within acceptable range on 1/14 8. Dyslipidemia: now on Lipitor.  9. Seasonal allergies: Resumed Claritin (for zyrtec) and nose spray to help manage PND. 10. HTN: Monitor BP bid. Continue Norvasc 10, lisinopril 20.    Controlled on 1/15 11. Question RUL nodule: Needs follow up CT chest for work up in the future. 12. Obesity: Dietician to educate on appropriate diet/food choices. Encourage  weight loss to help decrease risk factors. 13. GERD: Cont Pepcid 20 14. Hyperglycemia   Relatively controlled 1/14    Cont to monitor   LOS (Days) 7 A FACE TO FACE EVALUATION WAS PERFORMED  Ori Kreiter Karis Juba 02/24/2017 8:32 AM

## 2017-02-24 NOTE — Progress Notes (Signed)
Physical Therapy Session Note  Patient Details  Name: [name] MRN: [id number] Date of Birth: 21-Sep-1965  Today's Date: 02/24/2017 PT Individual Time: 0802-0900 PT Individual Time Calculation (min): 58 min   Short Term Goals: Week 1:  PT Short Term Goal 1 (Week 1): = LTGs due to ELOS  Skilled Therapeutic Interventions/Progress Updates:    Pt supine in bed upon PT arrival, agreeable to therapy tx and denies pain. Pt transferred to sitting with supervision and ambulated into bathroom with supervision. Pt ambulated from room>gym x 150 ft without AD with supervision. Pt worked on dynamic standing balance and ankle strategies with intermittent UE support in parallel bars to standing on rocker board and weightshift side/side and forward/backwards. Pt worked on standing balance while standing on bosu, intermittent UE support in order to keep from LOB. Pt ambulated from gym>BI gym with supervision while carrying an object. Pt worked on dynamic standing balance and L UE NMR in order to use dynavision while standing on foam pad, decreased reaction time with L UE compared to R UE. Pt ambulated back to gym with supervision, no AD. Pt worked on dynamic standing balance in order to throw basketball hoops and pick up ball off of the floor, CGA. Pt ambulated to ortho gym and across uneven surfaces with supervision, ambulated back to room and left seated in w/c with needs in reach.   Therapy Documentation Precautions:  Precautions Precautions: Fall Precaution Comments: left neglect/inattention Restrictions Weight Bearing Restrictions: No   See Function Navigator for Current Functional Status.   Therapy/Group: Individual Therapy  Cresenciano GenreEmily van Schagen, PT, DPT 02/24/2017, 7:54 AM

## 2017-02-24 NOTE — Progress Notes (Signed)
Speech Language Pathology Daily Session Note  Patient Details  Name: [name] MRN: [id number] Date of Birth: November 13, 1965  Today's Date: 02/24/2017 SLP Individual Time: 1105-1130 SLP Individual Time Calculation (min): 25 min  Short Term Goals: Week 1: SLP Short Term Goal 1 (Week 1): STG=LTG due to ELOS   Skilled Therapeutic Interventions:  Pt was seen for skilled ST targeting speech goals.  Therapist facilitated the session with functional conversations in a noisy environment while pt was engaged in a card game to address carryover of intelligibility strategies across a variety of contexts.  Pt was intelligible at the conversational level with only x1 supervision level verbal cue over >20 minutes to repeat information to clarify.  Pt was returned to room and left in chair with call bell within reach.  Continue per current plan of care.    Function:  Eating Eating                 Cognition Comprehension Comprehension assist level: Follows complex conversation/direction with extra time/assistive device  Expression   Expression assist level: Expresses complex 90% of the time/cues < 10% of the time  Social Interaction Social Interaction assist level: Interacts appropriately with others - No medications needed.  Problem Solving Problem solving assist level: Solves complex problems: With extra time  Memory Memory assist level: More than reasonable amount of time    Pain Pain Assessment Pain Assessment: No/denies pain Pain Score: 0-No pain  Therapy/Group: Individual Therapy  Mohamadou Maciver, Melanee SpryNicole L 02/24/2017, 11:34 AM

## 2017-02-24 NOTE — Progress Notes (Signed)
Recreational Therapy Session Note  Patient Details  Name: [name] MRN: [id number] Date of Birth: [birth year] Today's Date: 02/24/2017  Pain: no c/o Skilled Therapeutic Interventions/Progress Updates: Session focused on community reintegration during co-treat with OT.  Goals of session include safe mobility on various surface types, accessing elevators, navigating through crowded environment, identification and negotiation of obstacles, education on energy conservation techniques and discharge planning.  Pt ambulated without AD throughout the hospital >1000' with supervision.  Pt took one rest break during hour long session.  Pt able to problem solve through potential obstacles he might encounter during his personal community pursuits with mod I.    Therapy/Group: Community reintegration/group Billijo Dilling 02/24/2017, 3:46 PM

## 2017-02-24 NOTE — Progress Notes (Signed)
Occupational Therapy Session Note  Patient Details  Name: [name] Herter III MRN: [id number] Date of Birth: 08-18-1965  Today's Date: 02/24/2017 OT Individual Time: 1005-1100 OT Individual Time Calculation (min): 55 min    Short Term Goals: Week 1:  OT Short Term Goal 1 (Week 1): Pt will complete LB bathing sit to stand with supervision. OT Short Term Goal 2 (Week 1): Pt will complete LB dressing sit to stand with supervision.  OT Short Term Goal 3 (Week 1): Pt will perform toilet transfers with supervision using RW.  OT Short Term Goal 4 (Week 1): Pt will perform walk-in shower transfers with supervision using the RW.    Skilled Therapeutic Interventions/Progress Updates:    Pt completed bathing and dressing to start session.  He was able to gather all clothing prior to shower and then transfer to the tub bench in the shower for bathing task, sit to stand.   All bathing with supervision except for washing back, which therapist assisted with per his request.  Dressing completed at EOB with pt ambulating to and from the sink as needed for grooming tasks with supervision throughout.  Once ADL was completed, had pt ambulate to the therapy gym with supervision for work on LUE FM coordination task, placing nuts, bolts, and washers on vertical boards using the LUE.  Pt with frequent drops approximately 60% of the time when attempting to start nuts with the LUE.  After completion of several items he ambulated back to his room where he was positioned in the wheelchair with call button and phone in reach.     Therapy Documentation Precautions:  Precautions Precautions: Fall Precaution Comments: left neglect/inattention Restrictions Weight Bearing Restrictions: No  Pain: Pain Assessment Pain Assessment: No/denies pain Pain Score: 0-No pain ADL: See Function Navigator for Current Functional Status.   Therapy/Group: Individual Therapy  Kelisha Dall OTR/L 02/24/2017, 12:28 PM

## 2017-02-25 ENCOUNTER — Inpatient Hospital Stay (HOSPITAL_COMMUNITY): Payer: BLUE CROSS/BLUE SHIELD

## 2017-02-25 ENCOUNTER — Inpatient Hospital Stay (HOSPITAL_COMMUNITY): Payer: BLUE CROSS/BLUE SHIELD | Admitting: Occupational Therapy

## 2017-02-25 ENCOUNTER — Inpatient Hospital Stay (HOSPITAL_COMMUNITY): Payer: BLUE CROSS/BLUE SHIELD | Admitting: Speech Pathology

## 2017-02-25 NOTE — Progress Notes (Signed)
[place] PHYSICAL MEDICINE & REHABILITATION     PROGRESS NOTE  Subjective/Complaints:  Pt seen lying in bed this AM.  He slept well overnight.  He is looking forward to discharge tomorrow.   ROS: Denies CP, SOB, N/V/D.  Objective: Vital Signs: Blood pressure 128/68, pulse 80, temperature 98.3 F (36.8 C), temperature source Oral, resp. rate 16, height 5\' 11"  (1.803 m), weight 119.7 kg (263 lb 14.3 oz), SpO2 95 %. No results found. Recent Labs    02/23/17 0741 02/24/17 0504  WBC 7.5 7.2  HGB 15.8 14.4  HCT 46.1 42.1  PLT 328 301   Recent Labs    02/23/17 0741  NA 141  K 4.0  CL 107  GLUCOSE 111*  BUN 28*  CREATININE 1.23  CALCIUM 9.5   CBG (last 3)  No results for input(s): GLUCAP in the last 72 hours.  Wt Readings from Last 3 Encounters:  02/25/17 119.7 kg (263 lb 14.3 oz)  02/12/17 128.2 kg (282 lb 10.1 oz)  11/26/16 129.6 kg (285 lb 12.8 oz)    Physical Exam:  BP 128/68 (BP Location: Right Arm)   Pulse 80   Temp 98.3 F (36.8 C) (Oral)   Resp 16   Ht 5\' 11"  (1.803 m)   Wt 119.7 kg (263 lb 14.3 oz)   SpO2 95%   BMI 36.81 kg/m  Constitutional: He appearswell-developedand Obese.  HENT: Normocephalicand atraumatic.  Eyes: EOMI. No discharge. Cardiovascular:RRR. No JVD. Respiratory:Breath sounds normal. Unlabored. ZO:XWRUEGI:Bowel sounds are normal. He exhibitsno distension.  Neurological: He isalertand oriented.  Left facial weakness with mild dysarthria.  Able to follow one and two step commands without difficulty.  Motor:  LUE/LLE: 4+/5 proximal to distal (LLE>LUE, improving) Skin: Skin iswarmand dry.  Psychiatric: He has anormal mood and affect. Hisbehavior is normal.Thought contentnormal.    Assessment/Plan: 1. Functional deficits secondary to right insular, parietal and cerebellar infarcts which require 3+ hours per day of interdisciplinary therapy in a comprehensive inpatient rehab setting. Physiatrist is providing close team  supervision and 24 hour management of active medical problems listed below. Physiatrist and rehab team continue to assess barriers to discharge/monitor patient progress toward functional and medical goals.  Function:  Bathing Bathing position   Position: Shower  Bathing parts Body parts bathed by patient: Right arm, Left arm, Chest, Abdomen, Front perineal area, Buttocks, Right upper leg, Left upper leg, Left lower leg, Right lower leg Body parts bathed by helper: Back  Bathing assist Assist Level: Supervision or verbal cues      Upper Body Dressing/Undressing Upper body dressing   What is the patient wearing?: Pull over shirt/dress     Pull over shirt/dress - Perfomed by patient: Thread/unthread right sleeve, Thread/unthread left sleeve, Put head through opening, Pull shirt over trunk          Upper body assist Assist Level: Supervision or verbal cues      Lower Body Dressing/Undressing Lower body dressing   What is the patient wearing?: Pants, Underwear, Socks, Shoes Underwear - Performed by patient: Thread/unthread right underwear leg, Thread/unthread left underwear leg, Pull underwear up/down   Pants- Performed by patient: Thread/unthread right pants leg, Thread/unthread left pants leg, Pull pants up/down       Socks - Performed by patient: Don/doff right sock, Don/doff left sock   Shoes - Performed by patient: Don/doff right shoe, Don/doff left shoe Shoes - Performed by helper: Fasten right, Fasten left          Lower body  assist Assist for lower body dressing: Supervision or verbal cues      Toileting Toileting   Toileting steps completed by patient: Adjust clothing prior to toileting, Performs perineal hygiene, Adjust clothing after toileting Toileting steps completed by helper: Adjust clothing prior to toileting, Adjust clothing after toileting(per Josem Kaufmann, NT) Toileting Assistive Devices: Grab bar or rail  Toileting assist Assist level: Supervision or  verbal cues   Transfers Chair/bed transfer   Chair/bed transfer method: Stand pivot Chair/bed transfer assist level: Supervision or verbal cues Chair/bed transfer assistive device: Armrests     Locomotion Ambulation     Max distance: 150 ft Assist level: Supervision or verbal cues   Wheelchair   Type: Manual Max wheelchair distance: 150 Assist Level: Moderate assistance (Pt 50 - 74%)  Cognition Comprehension Comprehension assist level: Follows complex conversation/direction with extra time/assistive device  Expression Expression assist level: Expresses complex 90% of the time/cues < 10% of the time  Social Interaction Social Interaction assist level: Interacts appropriately with others with medication or extra time (anti-anxiety, antidepressant).  Problem Solving Problem solving assist level: Solves complex problems: With extra time  Memory Memory assist level: More than reasonable amount of time    Medical Problem List and Plan: 1.Left hemiparesis and functional deficitssecondary to right insular, parietal and cerebellar infarcts    Continue CIR, plan for d/c tomorrow per pt 2. DVT Prophylaxis/Anticoagulation: Pharmaceutical:Lovenox   Plts WNL on 1/15 3. Pain Management:N/A 4. Mood:LCSW to follow for evaluation and support. 5. Neuropsych: This patientiscapable of making decisions on hisown behalf. 6. Skin/Wound Care:routine pressure relief measures 7. Fluids/Electrolytes/Nutrition:   Dietician to educate on [employer].    Intake ?improving    BMP within acceptable range on 1/14 8. Dyslipidemia: now on Lipitor.  9. Seasonal allergies: Resumed Claritin (for zyrtec) and nose spray to help manage PND. 10. HTN: Monitor BP bid. Continue Norvasc 10, lisinopril 20.    Controlled on 1/16 11. Question RUL nodule: Needs follow up CT chest for work up in the future. 12. Obesity: Dietician to educate on appropriate diet/food choices. Encourage weight loss to help decrease  risk factors. 13. GERD: Cont Pepcid 20 14. Hyperglycemia   Relatively controlled 1/14    Cont to monitor   LOS (Days) 8 A FACE TO FACE EVALUATION WAS PERFORMED  Shahed Yeoman Karis Juba 02/25/2017 10:58 AM

## 2017-02-25 NOTE — Progress Notes (Signed)
Social Work Patient ID: [name], male   DOB: 18-Apr-1965, 52 y.o.   MRN: [id number]  Team feels pt is meeting his goals sooner than expected and will be ready for discharge tomorrow. No equipment needs and is agreeable to OP therapies. MD and PA aware, will work on discharge for tomorrow.

## 2017-02-25 NOTE — Discharge Summary (Signed)
Physician Discharge Summary  Patient ID: [name] MRN: [id number] DOB/AGE: [birth year] 52 y.o.  Admit date: 02/17/2017 Discharge date: 02/26/2017  Discharge Diagnoses:  Principal Problem:   Acute ischemic right MCA stroke [facility](HCC) Active Problems:   Essential hypertension   Hyperlipidemia   Morbid obesity (HCC)   Gastroesophageal reflux disease   Hyperglycemia   Discharged Condition: stable   Significant Diagnostic Studies: N/A   Labs:  Basic Metabolic Panel: Recent Labs  Lab 02/23/17 0741  NA 141  K 4.0  CL 107  CO2 23  GLUCOSE 111*  BUN 28*  CREATININE 1.23  CALCIUM 9.5   BMP Latest Ref Rng & Units 02/23/2017 02/18/2017 02/15/2017  Glucose 65 - 99 mg/dL 782(N111(H) 562(Z100(H) 96  BUN 6 - 20 mg/dL 30(Q28(H) 65(H25(H) 15  Creatinine 0.61 - 1.24 mg/dL 8.461.23 9.621.18 9.521.16  BUN/Creat Ratio 9 - 20 - - -  Sodium 135 - 145 mmol/L 141 138 137  Potassium 3.5 - 5.1 mmol/L 4.0 3.8 3.8  Chloride 101 - 111 mmol/L 107 105 107  CO2 22 - 32 mmol/L 23 24 23   Calcium 8.9 - 10.3 mg/dL 9.5 9.1 9.3    CBC: Recent Labs  Lab 02/23/17 0741 02/24/17 0504  WBC 7.5 7.2  NEUTROABS 4.3  --   HGB 15.8 14.4  HCT 46.1 42.1  MCV 91.7 91.7  PLT 328 301    CBG: No results for input(s): GLUCAP in the last 168 hours.  Brief HPI:   Jesus IlesChester Shackleford IIIis a 51 y.o.malewith history of HTN who was admitted on 02/12/17 with left facial and left sided weakness with numbness. He received tPA and followMRI brain done showing "small areas of acute/early subacute cortical infarction in right posterior insula and right lateral parietal lobeas well as very small right superior cerebellar subacute infarct".Hypercoagulopathy panelpositive for lupus anticoagulant and BLE dopplers negative for DVT.  TEEwas negative for PFO or thrombus  and loop recorder placed01/7. On ASA for embolic strokedue to unknown source.  He was limited by left sided weakness with sensory deficits, mild dysarthria, and higher level cognitive  deficits.  CIR was recommended due to functional deficits.   Hospital Course: [name] was admitted to rehab 02/17/2017 for inpatient therapies to consist of PT, ST and OT at least three hours five days a week. Past admission physiatrist, therapy team and rehab RN have worked together to provide customized collaborative inpatient rehab. He continues on ASA for secondary stroke prevention and follow up CBC showed H/H and platelets to be stable. His blood pressures have been monitored on bid basis and lisinopril was added for tighter control.  He has been educated on heart healthy diet and po intake has improved. Pepcid was added to help with GERD and symptoms have resolved. Mild hyperglycemia noted--Hgb A1c- Claritin was resumed to help manage PND.  No respiratory symptoms reported and no reports of tobacco use in the past. He is to follow up with primary provider for CT chest in the future. He has had improvement in use of LUE as well as im   Rehab course: During patient's stay in rehab weekly team conferences were held to monitor patient's progress, set goals and discuss barriers to discharge. At admission, patient required min assist with ADL tasks and total assist with mobility. He exhibited mild dysarthria and mild higher level executive deficits. He has had improvement in activity tolerance, balance, postural control, as well as ability to compensate for deficits. He/She is has had improvement in functional  use LUE  And LLE as well as improvement in  Awareness. He is able to complete ADL tasks at modified independent level. He is modified independent for transfers and to ambulate 300' without AD.  He has had improvement in recall, problem solving and awareness. He is modified independent for semi complex cognitive task and speech intelligibility has improved with use of compensatory strategies.     Disposition: 01-Home or Self Care  Diet: heart healthy.   Special Instructions: 1. Needs CT  chest to work up right nipple shadow RUL seen on CTA. 2. No driving or strenuous activity till cleared by MD.     Allergies as of 02/26/2017      Reactions   Penicillins Other (See Comments)   Headache Has patient had a PCN reaction causing immediate rash, facial/tongue/throat swelling, SOB or lightheadedness with hypotension: No Has patient had a PCN reaction causing severe rash involving mucus membranes or skin necrosis: No Has patient had a PCN reaction that required hospitalization: No Has patient had a PCN reaction occurring within the last 10 years:No If all of the above answers are "NO", then may proceed with Cephalosporin use.      Medication List    STOP taking these medications   ibuprofen 200 MG tablet Commonly known as:  ADVIL,MOTRIN     TAKE these medications   acetaminophen 325 MG tablet Commonly known as:  TYLENOL Take 650 mg by mouth every 6 (six) hours as needed for mild pain.   amLODipine 10 MG tablet Commonly known as:  NORVASC Take 1 tablet (10 mg total) by mouth daily.   aspirin 325 MG EC tablet Take 1 tablet (325 mg total) by mouth daily.   atorvastatin 40 MG tablet Commonly known as:  LIPITOR Take 1 tablet (40 mg total) by mouth daily at 6 PM.   cetirizine 10 MG tablet Commonly known as:  ZYRTEC Take 10 mg by mouth daily as needed for allergies.   docusate sodium 100 MG capsule Commonly known as:  COLACE Take 2 capsules (200 mg total) by mouth at bedtime.   famotidine 20 MG tablet Commonly known as:  PEPCID Take 1 tablet (20 mg total) by mouth daily.   lisinopril 20 MG tablet Commonly known as:  PRINIVIL,ZESTRIL Take 1 tablet (20 mg total) by mouth daily.      Follow-up Information    Marcello Fennel, MD Follow up.   Specialty:  Physical Medicine and Rehabilitation Why:  office will call you for follow up appointment  Contact information: [address] [place] [address] [phone]        Micki Riley, MD  Follow up.   Specialties:  Neurology, Radiology Why:  call for follow up in 4 weeks Contact information: [address] [place] [address] [phone]        Porfirio Oar, PA-C Follow up on 02/27/2017.   Specialty:  Family Medicine Why:  Appointment @ 8:20 am Contact information: [address] [place] [address] [phone]           Signed: Jacquelynn Cree 02/26/2017, 5:00 PM

## 2017-02-25 NOTE — Progress Notes (Signed)
Speech Language Pathology Discharge Summary  Patient Details  Name: [name] MRN: [id number] Date of Birth: [birth year]  Today's Date: 02/25/2017 SLP Individual Time: 1005-1049 SLP Individual Time Calculation (min): 44 min   Skilled Therapeutic Interventions:  Pt was seen for skilled ST targeting cognitive goals and completion of education prior to discharge.  SLP administered the MoCA to measure progress from initial evaluation.  Pt scored 27/30 (n>/=26).  Pt reports subjectively that his cognition is nearer to his baseline than upon admission but still notices some intermittent difficulty with recall of new information.  SLP provided skilled education regarding memory compensatory strategies and provided pt with a handout to maximize carryover in the home environment.  SLP also discussed recommendations that pt have at least initial assist for medication management once discharged home.  Pt verbalized understanding and was in agreement with recommendations.  SLP reviewed and reinforced speech strategies as well with pt demonstrating ability to self monitor intelligibility at the conversational level with mod I.  Discussed recommendations for OP ST follow up to facilitate return to work and community goals.  Pt in agreement.  All education complete at this time.  Pt was left in wheelchair with call bell within reach.     Patient has met 4 of 4 long term goals.  Patient to discharge at overall Modified Independent level.  Reasons goals not met:     Clinical Impression/Discharge Summary:   Pt has made functional gains and is discharging having met 4 out of 4 long term goals.  Pt has demonstrated improved recall of new information, problem solving, and anticipatory awareness and is mod I for semi-complex cognitive tasks.  Additionally pt has demonstrated improved intelligibility at the conversational level and is mod I for use of compensatory strategies as needed.  Pt is discharging home with  24/7 supervision from family.  Recommend initial assist for medication management until new routines become automatic in the home environment.  Also recommend outpatient ST follow up to facilitate return to work and community.  Education is complete at this time.    Care Partner:  Caregiver Able to Provide Assistance: Yes  Type of Caregiver Assistance: Physical;Cognitive  Recommendation:  Outpatient SLP  Rationale for SLP Follow Up: Maximize functional communication;Maximize cognitive function and independence   Equipment: none recommended by SLP    Reasons for discharge: Discharged from hospital   Patient/Family Agrees with Progress Made and Goals Achieved: Yes   Function:  Eating Eating                 Cognition Comprehension Comprehension assist level: Follows complex conversation/direction with extra time/assistive device  Expression   Expression assist level: Expresses complex 90% of the time/cues < 10% of the time  Social Interaction Social Interaction assist level: Interacts appropriately with others with medication or extra time (anti-anxiety, antidepressant).  Problem Solving Problem solving assist level: Solves complex problems: With extra time  Memory Memory assist level: More than reasonable amount of time   Emilio Math 02/25/2017, 11:32 AM

## 2017-02-25 NOTE — Progress Notes (Signed)
Occupational Therapy Discharge Summary  Patient Details  Name: [name] MRN: [id number] Date of Birth: 08/01/1965  Today's Date: 02/25/2017 OT Individual Time: 0800-0900 OT Individual Time Calculation (min): 60 min    Session Note:  Pt completed bathing and dressing at modified independent level with spouse present for education.  He was able to gather all clothing and utilize shower seat for safety.  Grooming tasks completed at modified independent level as well.  Discussed the need to use a shower seat at home as well as having a bath mat or gripping surface in the tub.  Pt's spouse reports that the surface of the shower has grip and she is purchasing a seat as well.  Next had pt ambulate to the tub/shower room and practice stepping over the edge of the tub with independence for simulated transfer.  Finished session with education on FM coordination tasks to be completed at home with handout provided.  Pt ambulated back to room at end of session and was left with spouse in his wheelchair.    Patient has met 14 of 14 long term goals due to improved activity tolerance, improved balance, ability to compensate for deficits, functional use of  LEFT upper and LEFT lower extremity, improved attention, improved awareness and improved coordination.  Patient to discharge at overall Supervision level.  Patient's care partner is independent to provide the necessary physical and cognitive assistance at discharge.    Reasons goals not met: NA  Recommendation:  Patient will benefit from ongoing skilled OT services in outpatient setting to continue to advance functional skills in the area of BADL, iADL, Vocation and Reduce care partner burden.  Pt still demonstrates some higher level divided attention deficits as they relate to home management tasks.  In addition, he continues to demonstrate decreased LUE coordination for FM tasks as well as a slight neglect to integrate the LUE more.  Feel he will benefit  from initial PRN supervision and continued outpatient OT to further progress independence and coordination for return back to independent level and return to his work.   Equipment: No equipment provided  Reasons for discharge: treatment goals met and discharge from hospital  Patient/family agrees with progress made and goals achieved: Yes  OT Discharge Precautions/Restrictions  Precautions Precautions: Fall Precaution Comments: left neglect/inattention Restrictions Weight Bearing Restrictions: No  Pain  No report of pain during session ADL  See Function Section of chart for details Vision Baseline Vision/History: Wears glasses Wears Glasses: At all times Patient Visual Report: No change from baseline Perception  Perception: Impaired Inattention/Neglect: Does not attend to left side of body(Needs min instructional cueing to use the left non-dominant UE more.) Praxis Praxis: Intact Cognition Overall Cognitive Status: Impaired/Different from baseline Arousal/Alertness: Awake/alert Orientation Level: Oriented X4 Attention: Alternating Focused Attention: Appears intact Sustained Attention: Appears intact Selective Attention: Appears intact Alternating Attention: Appears intact Memory: Impaired Awareness: Appears intact Problem Solving: Impaired Safety/Judgment: Appears intact Comments: Pt continues to need increased time for divided and alternating attention tasks such as meal prep.   Sensation Sensation Light Touch: Impaired Detail Light Touch Impaired Details: Impaired LUE;Impaired LLE Stereognosis: Impaired Detail Stereognosis Impaired Details: Impaired LUE Hot/Cold: Impaired Detail Hot/Cold Impaired Details: Impaired LUE Proprioception: Impaired Detail Proprioception Impaired Details: Impaired LUE;Impaired LLE Additional Comments: Pt still demonstrates limited light touch, deep pressure and hot cold sensation in the LUE, with more impairement distally.   Coordination Gross Motor Movements are Fluid and Coordinated: No Fine Motor Movements are Fluid and Coordinated: No  Coordination and Movement Description: Still with impaired FM coordination in the left hand.  Need 2.5 mins to complete 5 pegs of the 9 hole peg test.   He is able to use currently at a diminshed level for selfcare tasks with increased time.  Motor  Motor Motor: Hemiplegia Motor - Skilled Clinical Observations: mild L hemiplegia Mobility    See Function Section of chart for details  Trunk/Postural Assessment  Cervical Assessment Cervical Assessment: Within Functional Limits Thoracic Assessment Thoracic Assessment: Within Functional Limits Lumbar Assessment Lumbar Assessment: Within Functional Limits Postural Control Postural Control: Within Functional Limits  Balance Balance Balance Assessed: Yes Static Sitting Balance Static Sitting - Balance Support: No upper extremity supported;Feet supported Static Sitting - Level of Assistance: 7: Independent Dynamic Sitting Balance Dynamic Sitting - Balance Support: During functional activity Dynamic Sitting - Level of Assistance: 6: Modified independent (Device/Increase time) Static Standing Balance Static Standing - Level of Assistance: 6: Modified independent (Device/Increase time) Dynamic Standing Balance Dynamic Standing - Balance Support: During functional activity Dynamic Standing - Level of Assistance: 6: Modified independent (Device/Increase time) Extremity/Trunk Assessment RUE Assessment RUE Assessment: Within Functional Limits LUE Assessment LUE Assessment: Exceptions to [facility] LUE Strength LUE Overall Strength Comments: Pt exhibits AROM WFLS for all joints.  Decreased finger to nose gross coordination.  Strength 4/5 throughout for shoulder flexion, elbow flexion/extension, and gross grasp.     See Function Navigator for Current Functional Status.  Sadie Pickar OTR/L 02/25/2017, 4:28 PM

## 2017-02-25 NOTE — Patient Care Conference (Signed)
Inpatient RehabilitationTeam Conference and Plan of Care Update Date: 02/25/2017   Time: 2:25 PM    Patient Name: [name]      Medical Record Number: [id number]  Date of Birth: 10/12/1965 Sex: Male         Room/Bed: 4M06C/4M06C-01 Payor Info: Payor: BLUE CROSS BLUE SHIELD / Plan: BCBS OTHER / Product Type: *No Product type* /    Admitting Diagnosis: CVA  Admit Date/Time:  02/17/2017  3:05 PM Admission Comments: No comment available   Primary Diagnosis:  <principal problem not specified> Principal Problem: <principal problem not specified>  Patient Active Problem List   Diagnosis Date Noted  . Hyperglycemia   . Poor nutrition   . Gastroesophageal reflux disease   . Hyperlipidemia   . Morbid obesity ([place])   . Acute ischemic right MCA stroke ([place]) 02/17/2017  . Cryptogenic stroke ([place]) 02/12/2017  . Allergic rhinitis 11/07/2016  . BMI 39.0-39.9,adult 11/07/2016  . Essential hypertension 10/18/2016    Expected Discharge Date: Expected Discharge Date: 02/26/17  Team Members Present: Physician leading conference: Dr. Delice Lesch Social Worker Present: Ovidio Kin, LCSW Nurse Present: Dorien Chihuahua, RN PT Present: Michaelene Song, PT OT Present: Clyda Greener, OT SLP Present: Windell Moulding, SLP PPS Coordinator present : Daiva Nakayama, RN, CRRN     Current Status/Progress Goal Weekly Team Focus  Medical   Left hemiparesis and functional deficits secondary to right insular, parietal and cerebellar infarcts   Improve mobility, transfers, hyperglycemia, BP  See above   Bowel/Bladder   Continent of B/B  remain continent with min assist  Assist with toileting as needed    Swallow/Nutrition/ Hydration             ADL's   supervision for all bathing, dressing, toileting, shower transfers.  Still with decreased LUE functional coordination and use during selfcare tasks, at time neglecting the UE  supervision to modified independence  neuromuscular re-education, selfcare  retraining, therapeutic exercise, balance retraining, pt/family education   Mobility   supervision for all mobility, gait >300 ft without AD supervision-CGA  mod I basic and furniture transfers, supervision car; supervision w/c x 150' and gait x 150' and up/down 12 steps 2 rails and (1) 10" high step for home entry, LRAD  L neuro re-ed, balance, gait, stairs, d/c planning, pt education   Communication   mod I   mod I   education complete, goals met   Safety/Cognition/ Behavioral Observations  mod I   mod I   education complete, goals met    Pain   denies pain  Pain <=2  Assess qshift and prn medicate as ordered assess for relief   Skin   skin intact  prevent infection and remain free of skin breakdown  assess qshift and prn      *See Care Plan and progress notes for long and short-term goals.     Barriers to Discharge  Current Status/Progress Possible Resolutions Date Resolved   Physician    Medical stability     See above  Therapies, optimize BP meds, making good progress.      Nursing                  PT                    OT                  SLP  SW                Discharge Planning/Teaching Needs:  Home with wife and parents to assist and provide 24hr supervision at discharge. Pt doing very well. Neuro-psych seeing for coping      Team Discussion:  Pt making good progress and reaching his goals sooner than expected. Moving up discharge date to tomorrow. No equipment needs and follow up OP therapies. Medically stable and ready for DC. Family education completed with wife today.   Revisions to Treatment Plan:  DC 1/17    Continued Need for Acute Rehabilitation Level of Care: The patient requires daily medical management by a physician with specialized training in physical medicine and rehabilitation for the following conditions: Daily direction of a multidisciplinary physical rehabilitation program to ensure safe treatment while eliciting the highest  outcome that is of practical value to the patient.: Yes Daily medical management of patient stability for increased activity during participation in an intensive rehabilitation regime.: Yes Daily analysis of laboratory values and/or radiology reports with any subsequent need for medication adjustment of medical intervention for : Neurological problems;Blood pressure problems;Other  Elease Hashimoto 02/25/2017, 2:38 PM

## 2017-02-25 NOTE — Progress Notes (Signed)
Physical Therapy Discharge Summary  Patient Details  Name: [name] MRN: [id number] Date of Birth: 1965-02-11  Today's Date: 02/25/2017 PT Individual Time: 1100-1155, 4098-1191 PT Individual Time Calculation (min): 55 min , 42 min    Patient has met 10 of 10 long term goals due to improved activity tolerance, improved balance, ability to compensate for deficits, improved attention and improved awareness.  Patient to discharge at an ambulatory level Modified Independent.   Patient's wife will provide supervision with stair navigation and long distance ambulation on uneven surfaces.   All Goals met.   Recommendation:  Patient will benefit from ongoing skilled PT services in outpatient setting to continue to advance safe functional mobility, address ongoing impairments in balance, endurance, L attention , and minimize fall risk.  Equipment: No equipment provided  Reasons for discharge: treatment goals met  Patient/family agrees with progress made and goals achieved: Yes   PT Treatment Interventions: Session 1: Pt seated in w/c upon PT arrival, agreeable to therapy tx and denies pain. Discharge summary completed this session with emphasis on functional mobility, education and ambulation outside on uneven surfaces. Pt donned clothing to go outside Mod I. Pt ambulated from room>outside 400+ ft with supervision. While outside pt worked on ambulation on uneven surfaces including inclines/declines/stairs/grass all with supervision. Pt ambulated back to the gym 400+ ft with supervision, no AD. Pt ambulates on level surfaces that are short distances Mod I. Pt ascended/descended 12 steps using single handrail, reciprocal pattern with supervision. Pt completed berg balance test this session as detailed below, therapist discussed results with the patient. Pt ambulated to ortho gym in order to perform car transfer with supervision. Pt performed all other transfers including bed<>chair Mod I and  performed all bed mobility Mod I. Pt left seated in w/c at end of session with needs in reach.   Session 2: Pt seated in w/c upon PT arrival, agreeable to therapy tx and denies pain. Pt ambulated from room>rehab apratment Mod I without AD. Pt worked on meal prep task in order to bake cookies this session while also working on standing balance to reach overhead into cabinets. Pt performed all tasks with supervision and occasional cues such as using two pans instead of one to save time. Pt able to gather ingredients and prepare cookies without assist. While cookies baked pt worked on divided attention task in order to wash dishes and engage in conversation. Occasional verbal cues for techniques to incorporate L UE use into tasks. Pt ambulated back to room Mod I and left seated in w/c with needs in reach.   PT Discharge Precautions/Restrictions Precautions Precautions: Fall Precaution Comments: left neglect/inattention Restrictions Weight Bearing Restrictions: No Pain Pain Assessment Pain Assessment: No/denies pain Pain Score: 0-No pain Faces Pain Scale: No hurt Cognition Overall Cognitive Status: Impaired/Different from baseline Arousal/Alertness: Awake/alert Orientation Level: Oriented X4 Attention: Alternating Alternating Attention: Appears intact Memory: Impaired Memory Impairment: Decreased recall of new information Awareness: Appears intact Problem Solving: Appears intact Safety/Judgment: Appears intact Sensation Sensation Light Touch: Impaired Detail Light Touch Impaired Details: Impaired LUE;Impaired LLE Stereognosis: Not tested Hot/Cold: Not tested Proprioception: Impaired Detail Proprioception Impaired Details: Impaired LUE;Impaired LLE Additional Comments: impaired sensation to L UE>L LE Coordination Gross Motor Movements are Fluid and Coordinated: No Fine Motor Movements are Fluid and Coordinated: No Coordination and Movement Description: impaired coordination  secondary to decreased sensation on L side Heel Shin Test: slow L LE Motor  Motor Motor: Hemiplegia Motor - Skilled Clinical Observations: mild L hemiplegia  Trunk/Postural Assessment  Cervical Assessment Cervical Assessment: Within Functional Limits Thoracic Assessment Thoracic Assessment: Within Functional Limits Lumbar Assessment Lumbar Assessment: Within Functional Limits Postural Control Postural Control: Within Functional Limits  Balance Balance Balance Assessed: Yes Standardized Balance Assessment Standardized Balance Assessment: Berg Balance Test Berg Balance Test Sit to Stand: Able to stand without using hands and stabilize independently Standing Unsupported: Able to stand safely 2 minutes Sitting with Back Unsupported but Feet Supported on Floor or Stool: Able to sit safely and securely 2 minutes Stand to Sit: Sits safely with minimal use of hands Transfers: Able to transfer safely, minor use of hands Standing Unsupported with Eyes Closed: Able to stand 10 seconds safely Standing Ubsupported with Feet Together: Able to place feet together independently and stand 1 minute safely From Standing, Reach Forward with Outstretched Arm: Can reach confidently >25 cm (10") From Standing Position, Pick up Object from Floor: Able to pick up shoe safely and easily From Standing Position, Turn to Look Behind Over each Shoulder: Looks behind from both sides and weight shifts well Turn 360 Degrees: Able to turn 360 degrees safely in 4 seconds or less Standing Unsupported, Alternately Place Feet on Step/Stool: Able to stand independently and complete 8 steps >20 seconds Standing Unsupported, One Foot in Front: Able to plae foot ahead of the other independently and hold 30 seconds Standing on One Leg: Able to lift leg independently and hold 5-10 seconds Total Score: 53 Static Sitting Balance Static Sitting - Level of Assistance: 6: Modified independent (Device/Increase time) Dynamic  Sitting Balance Dynamic Sitting - Level of Assistance: 6: Modified independent (Device/Increase time) Static Standing Balance Static Standing - Level of Assistance: 6: Modified independent (Device/Increase time) Dynamic Standing Balance Dynamic Standing - Level of Assistance: 5: Stand by assistance Extremity Assessment  RLE Assessment RLE Assessment: Within Functional Limits LLE Assessment LLE Assessment: Exceptions to [facility] LLE Strength LLE Overall Strength Comments: grossly in sitting: 4/5 hip flex, knee ext/flex, ankle DF   See Function Navigator for Current Functional Status.  Netta Corrigan, PT, DPT 02/25/2017, 11:33 AM

## 2017-02-26 MED ORDER — FAMOTIDINE 20 MG PO TABS: 20.0000 mg | ORAL_TABLET | Freq: Every day | ORAL | 0 refills | Status: DC

## 2017-02-26 MED ORDER — DOCUSATE SODIUM 100 MG PO CAPS: 200.0000 mg | ORAL_CAPSULE | Freq: Every day | ORAL | 0 refills | Status: DC

## 2017-02-26 MED ORDER — ASPIRIN 325 MG PO TBEC: 325.0000 mg | DELAYED_RELEASE_TABLET | Freq: Every day | ORAL | 0 refills | Status: AC

## 2017-02-26 MED ORDER — LISINOPRIL 20 MG PO TABS: 20.0000 mg | ORAL_TABLET | Freq: Every day | ORAL | 0 refills | Status: DC

## 2017-02-26 MED ORDER — AMLODIPINE BESYLATE 10 MG PO TABS: 10.0000 mg | ORAL_TABLET | Freq: Every day | ORAL | 0 refills | Status: DC

## 2017-02-26 MED ORDER — ATORVASTATIN CALCIUM 40 MG PO TABS: 40.0000 mg | ORAL_TABLET | Freq: Every day | ORAL | 0 refills | Status: DC

## 2017-02-26 NOTE — Progress Notes (Signed)
Recreational Therapy Discharge Summary Patient Details  Name: Zanden Colver III MRN: [id number] Date of Birth: Jun 13, 1965 Today's Date: 02/26/2017 Comments on progress toward goals: Pt has made great progress during LOS and is ready for discharge home at supervision level.  TR sessions focused on activity analysis with potential modifications, community reintegration, energy conservation techniques.  All education goals met. Reasons for discharge: discharge from hospital  Patient/family agrees with progress made and goals achieved: Yes  Sheriff Rodenberg 02/26/2017, 8:06 AM

## 2017-02-26 NOTE — Progress Notes (Signed)
Discharged to home accompanied by wife. Discharge info given to patient by Marissa NestlePam Love PAC. No questions noted. Pt feels ready for d/c. [name], [name]

## 2017-02-26 NOTE — Progress Notes (Signed)
Social Work  Discharge Note  The overall goal for the admission was met for:   Discharge location: Yes-HOME WITH WIFE AND PARENTS WHO CAN PROVIDE 24 HR SUPERVISION   Length of Stay: Yes-9 DAYS  Discharge activity level: Yes-SUPERVISION-MOD/I LEVEL  Home/community participation: Yes  Services provided included: MD, RD, PT, OT, SLP, RN, CM, TR, Pharmacy, Neuropsych and SW  Financial Services: Private Insurance: [employer]  Follow-up services arranged: Outpatient: [facility]-PT,OT,SP 1/24 9:15-11:00 (PT & OT) & 1/28 11;45-12:30   Comments (or additional information):PT RECOVERED QUICKLY AND MOVED UP DISCHARGE DATE BY TWO DAYS DUE TO REACHING GOALS SOONER. FAMILY EDUCATION COMPLETED AND COMFORTABLE WITH DISCHARGE HOME  Patient/Family verbalized understanding of follow-up arrangements: Yes  Individual responsible for coordination of the follow-up plan: SELF & VICKI-WIFE  Confirmed correct DME delivered: Elease Hashimoto 02/26/2017    Elease Hashimoto

## 2017-02-26 NOTE — Discharge Instructions (Signed)
Inpatient Rehab Discharge Instructions  [name] Discharge date and time:  02/26/17  Activities/Precautions/ Functional Status: Activity: no lifting, driving, or strenuous exercise for till cleared by MD Diet: cardiac diet Wound Care: keep wound clean and dry   Functional status:  ___ No restrictions     ___ Walk up steps independently ___ 24/7 supervision/assistance   ___ Walk up steps with assistance _X__ Intermittent supervision/assistance  _X__ Bathe/dress independently ___ Walk with walker     ___ Bathe/dress with assistance ___ Walk Independently    ___ Shower independently ___ Walk with assistance    _X__ Shower with supervision _X__ No alcohol     ___ Return to work/school ________   Special Instructions: 1. Need supervision if out of home setting. 2. Continue home exercise program.    COMMUNITY REFERRALS UPON DISCHARGE:     Outpatient: PT, OT, SP  Agency:[facility] Phone:[phone]   Date of Last Service:02/28/2017  Appointment Date/Time:JAN 24 9:15-11:00 (PT & OT) JAN 28 11:45-12:30 ( SP)  Medical Equipment/Items Ordered:NO NEEDS   GENERAL COMMUNITY RESOURCES FOR PATIENT/FAMILY: Support Groups:[facility] SUPPORT GROUP EVERY SECOND Thursday @ 3:00-4:00 ON THE REHAB UNIT QUESTIONS CONTACT [place] [phone]   STROKE/TIA DISCHARGE INSTRUCTIONS SMOKING Cigarette smoking nearly doubles your risk of having a stroke & is the single most alterable risk factor  If you smoke or have smoked in the last 12 months, you are advised to quit smoking for your health.  Most of the excess cardiovascular risk related to smoking disappears within a year of stopping.  Ask you doctor about anti-smoking medications  [place] Quit Line: 1-800-QUIT NOW  Free Smoking Cessation Classes [phone]  CHOLESTEROL Know your levels; limit fat & cholesterol in your diet  Lipid Panel     Component Value Date/Time   CHOL 185 02/13/2017 0753   CHOL 205 (H) 11/26/2016 1040    TRIG 98 02/13/2017 0753   HDL 28 (L) 02/13/2017 0753   HDL 34 (L) 11/26/2016 1040   CHOLHDL 6.6 02/13/2017 0753   VLDL 20 02/13/2017 0753   LDLCALC 137 (H) 02/13/2017 0753   LDLCALC 143 (H) 11/26/2016 1040      Many patients benefit from treatment even if their cholesterol is at goal.  Goal: Total Cholesterol (CHOL) less than 160  Goal:  Triglycerides (TRIG) less than 150  Goal:  HDL greater than 40  Goal:  LDL (LDLCALC) less than 100   BLOOD PRESSURE American Stroke Association blood pressure target is less that 120/80 mm/Hg  Your discharge blood pressure is:  BP: 132/73  Monitor your blood pressure  Limit your salt and alcohol intake  Many individuals will require more than one medication for high blood pressure  DIABETES (A1c is a blood sugar average for last 3 months) Goal HGBA1c is under 7% (HBGA1c is blood sugar average for last 3 months)  Diabetes: No known diagnosis of diabetes    Lab Results  Component Value Date   HGBA1C 5.5 02/13/2017     Your HGBA1c can be lowered with medications, healthy diet, and exercise.  Check your blood sugar as directed by your physician  Call your physician if you experience unexplained or low blood sugars.  PHYSICAL ACTIVITY/REHABILITATION Goal is 30 minutes at least 4 days per week  Activity: Increase activity slowly, and No driving, Therapies: see above Return to work: to be decided on follow up  Activity decreases your risk of heart attack and stroke and makes your heart stronger.  It helps  control your weight and blood pressure; helps you relax and can improve your mood.  Participate in a regular exercise program.  Talk with your doctor about the best form of exercise for you (dancing, walking, swimming, cycling).  DIET/WEIGHT Goal is to maintain a healthy weight  Your discharge diet is: Heart Healthy/thin`  liquids Your height is:  Height: 5\' 11"  (180.3 cm) Your current weight is: Weight: 119.7 kg (263 lb 14.3  oz) Your Body Mass Index (BMI) is:  BMI (Calculated): 36.82  Following the type of diet specifically designed for you will help prevent another stroke.  Your goal weight is: 179 lbs  Your goal Body Mass Index (BMI) is 19-24.  Healthy food habits can help reduce 3 risk factors for stroke:  High cholesterol, hypertension, and excess weight.  RESOURCES Stroke/Support Group:  Call [phone]   STROKE EDUCATION PROVIDED/REVIEWED AND GIVEN TO PATIENT Stroke warning signs and symptoms How to activate emergency medical system (call 911). Medications prescribed at discharge. Need for follow-up after discharge. Personal risk factors for stroke. Pneumonia vaccine given:  Flu vaccine given:  My questions have been answered, the writing is legible, and I understand these instructions.  I will adhere to these goals & educational materials that have been provided to me after my discharge from the hospital.     My questions have been answered and I understand these instructions. I will adhere to these goals and the provided educational materials after my discharge from the hospital.  Patient/Caregiver Signature _______________________________ Date __________  Clinician Signature _______________________________________ Date __________  Please bring this form and your medication list with you to all your follow-up doctor's appointments.

## 2017-02-26 NOTE — Progress Notes (Signed)
[place] PHYSICAL MEDICINE & REHABILITATION     PROGRESS NOTE  Subjective/Complaints:  Patient seen lying bed this morning. He states he slept well overnight. He is ready for discharge.  ROS: Denies CP, SOB, N/V/D.  Objective: Vital Signs: Blood pressure 132/73, pulse 97, temperature 97.7 F (36.5 C), temperature source Oral, resp. rate 18, height 5\' 11"  (1.803 m), weight 119.7 kg (263 lb 14.3 oz), SpO2 97 %. No results found. Recent Labs    02/24/17 0504  WBC 7.2  HGB 14.4  HCT 42.1  PLT 301   No results for input(s): NA, K, CL, GLUCOSE, BUN, CREATININE, CALCIUM in the last 72 hours.  Invalid input(s): CO CBG (last 3)  No results for input(s): GLUCAP in the last 72 hours.  Wt Readings from Last 3 Encounters:  02/25/17 119.7 kg (263 lb 14.3 oz)  02/12/17 128.2 kg (282 lb 10.1 oz)  11/26/16 129.6 kg (285 lb 12.8 oz)    Physical Exam:  BP 132/73 (BP Location: Right Arm)   Pulse 97   Temp 97.7 F (36.5 C) (Oral)   Resp 18   Ht 5\' 11"  (1.803 m)   Wt 119.7 kg (263 lb 14.3 oz)   SpO2 97%   BMI 36.81 kg/m  Constitutional: He appearswell-developedand Obese.  HENT: Normocephalicand atraumatic.  Eyes: EOMI. No discharge. Cardiovascular:RR. No JVD. Respiratory:Breath sounds normal. Unlabored. UJ:WJXBJ sounds are normal. He exhibitsno distension.  Neurological: He isalertand oriented.  Left facial weakness with mild dysarthria.  Able to follow one and two step commands without difficulty.  Motor:  LUE/LLE: 4+/5 proximal to distal (LLE>LUE, stable) Some left inattention Skin: Skin iswarmand dry.  Psychiatric: He has anormal mood and affect. Hisbehavior is normal.Thought contentnormal.    Assessment/Plan: 1. Functional deficits secondary to right insular, parietal and cerebellar infarcts which require 3+ hours per day of interdisciplinary therapy in a comprehensive inpatient rehab setting. Physiatrist is providing close team supervision and 24 hour  management of active medical problems listed below. Physiatrist and rehab team continue to assess barriers to discharge/monitor patient progress toward functional and medical goals.  Function:  Bathing Bathing position   Position: Shower  Bathing parts Body parts bathed by patient: Right arm, Left arm, Chest, Abdomen, Front perineal area, Buttocks, Right upper leg, Left upper leg, Left lower leg, Right lower leg, Back Body parts bathed by helper: Back  Bathing assist Assist Level: More than reasonable time      Upper Body Dressing/Undressing Upper body dressing   What is the patient wearing?: Pull over shirt/dress     Pull over shirt/dress - Perfomed by patient: Thread/unthread right sleeve, Thread/unthread left sleeve, Put head through opening, Pull shirt over trunk          Upper body assist Assist Level: More than reasonable time      Lower Body Dressing/Undressing Lower body dressing   What is the patient wearing?: Pants, Underwear, Socks, Shoes Underwear - Performed by patient: Thread/unthread right underwear leg, Thread/unthread left underwear leg, Pull underwear up/down   Pants- Performed by patient: Thread/unthread right pants leg, Thread/unthread left pants leg, Pull pants up/down       Socks - Performed by patient: Don/doff right sock, Don/doff left sock   Shoes - Performed by patient: Don/doff right shoe, Don/doff left shoe Shoes - Performed by helper: Fasten right, Fasten left          Lower body assist Assist for lower body dressing: More than reasonable time      Toileting  Toileting   Toileting steps completed by patient: Adjust clothing prior to toileting, Performs perineal hygiene, Adjust clothing after toileting Toileting steps completed by helper: Adjust clothing prior to toileting, Performs perineal hygiene, Adjust clothing after toileting Toileting Assistive Devices: Grab bar or rail  Toileting assist Assist level: More than reasonable time    Transfers Chair/bed transfer   Chair/bed transfer method: Stand pivot, Ambulatory Chair/bed transfer assist level: No Help, no cues, assistive device, takes more than a reasonable amount of time Chair/bed transfer assistive device: Armrests     Locomotion Ambulation     Max distance: >400 ft Assist level: No help, No cues, assistive device, takes more than a reasonable amount of time   Wheelchair   Type: Manual Max wheelchair distance: 150 Assist Level: Moderate assistance (Pt 50 - 74%)  Cognition Comprehension Comprehension assist level: Follows complex conversation/direction with extra time/assistive device  Expression Expression assist level: Expresses complex ideas: With extra time/assistive device  Social Interaction Social Interaction assist level: Interacts appropriately with others - No medications needed.  Problem Solving Problem solving assist level: Solves complex problems: With extra time  Memory Memory assist level: More than reasonable amount of time    Medical Problem List and Plan: 1.Left hemiparesis and functional deficitssecondary to right insular, parietal and cerebellar infarcts    DC today     Will see patient for transitional care management in 1-2 weeks 2. DVT Prophylaxis/Anticoagulation: Pharmaceutical:Lovenox   Plts WNL on 1/15 3. Pain Management:N/A 4. Mood:LCSW to follow for evaluation and support. 5. Neuropsych: This patientiscapable of making decisions on hisown behalf. 6. Skin/Wound Care:routine pressure relief measures 7. Fluids/Electrolytes/Nutrition:   Dietician to educate on [employer] diet.    Intake remains labile    BMP within acceptable range on 1/14 8. Dyslipidemia: now on Lipitor.  9. Seasonal allergies: Resumed Claritin (for zyrtec) and nose spray to help manage PND. 10. HTN: Monitor BP bid. Continue Norvasc 10, lisinopril 20.    Controlled on 1/17 11. Question RUL nodule: Needs follow up CT chest for work up in the  future. 12. Obesity: Dietician to educate on appropriate diet/food choices. Encourage weight loss to help decrease risk factors. 13. GERD: Cont Pepcid 20 14. Hyperglycemia   Relatively controlled 1/14    Cont to monitor   LOS (Days) 9 A FACE TO FACE EVALUATION WAS PERFORMED  Dea Bitting Karis Jubanil Jguadalupe Opiela 02/26/2017 8:28 AM

## 2017-02-27 ENCOUNTER — Ambulatory Visit (INDEPENDENT_AMBULATORY_CARE_PROVIDER_SITE_OTHER): Payer: BLUE CROSS/BLUE SHIELD | Admitting: Physician Assistant

## 2017-02-27 ENCOUNTER — Other Ambulatory Visit: Payer: Self-pay

## 2017-02-27 ENCOUNTER — Encounter: Payer: Self-pay | Admitting: Physician Assistant

## 2017-02-27 VITALS — BP 122/72 | HR 97 | Temp 99.1°F | Resp 18 | Ht 71.0 in | Wt 269.8 lb

## 2017-02-27 DIAGNOSIS — I10 Essential (primary) hypertension: Secondary | ICD-10-CM | POA: Diagnosis not present

## 2017-02-27 DIAGNOSIS — E785 Hyperlipidemia, unspecified: Secondary | ICD-10-CM

## 2017-02-27 DIAGNOSIS — R9089 Other abnormal findings on diagnostic imaging of central nervous system: Secondary | ICD-10-CM

## 2017-02-27 DIAGNOSIS — J309 Allergic rhinitis, unspecified: Secondary | ICD-10-CM

## 2017-02-27 DIAGNOSIS — I63511 Cerebral infarction due to unspecified occlusion or stenosis of right middle cerebral artery: Secondary | ICD-10-CM

## 2017-02-27 DIAGNOSIS — Z6839 Body mass index (BMI) 39.0-39.9, adult: Secondary | ICD-10-CM

## 2017-02-27 DIAGNOSIS — R739 Hyperglycemia, unspecified: Secondary | ICD-10-CM

## 2017-02-27 DIAGNOSIS — Z7189 Other specified counseling: Secondary | ICD-10-CM

## 2017-02-27 DIAGNOSIS — Z7689 Persons encountering health services in other specified circumstances: Secondary | ICD-10-CM

## 2017-02-27 DIAGNOSIS — K219 Gastro-esophageal reflux disease without esophagitis: Secondary | ICD-10-CM

## 2017-02-27 MED ORDER — LISINOPRIL 20 MG PO TABS: 20.0000 mg | ORAL_TABLET | Freq: Every day | ORAL | 3 refills | Status: AC

## 2017-02-27 MED ORDER — AMLODIPINE BESYLATE 10 MG PO TABS: 10.0000 mg | ORAL_TABLET | Freq: Every day | ORAL | 3 refills | Status: AC

## 2017-02-27 MED ORDER — ATORVASTATIN CALCIUM 40 MG PO TABS: 40.0000 mg | ORAL_TABLET | Freq: Every day | ORAL | 3 refills | Status: AC

## 2017-02-27 MED ORDER — FAMOTIDINE 20 MG PO TABS: 20.0000 mg | ORAL_TABLET | Freq: Every day | ORAL | 3 refills | Status: AC

## 2017-02-27 NOTE — Progress Notes (Addendum)
Patient ID: [name], male    DOB: 05-Jun-1965, 52 y.o.   MRN: [id number]  PCP: Porfirio Oar, PA-C  Chief Complaint  Patient presents with  . Hospitalization Follow-up    Pt states he was hospitalized for 2 weeks due to a stroke.     Subjective:                                                      TRANSITION OF CARE VISIT  Date of Admission: 02/12/2017 (hospital), 02/17/2017 (rehabilitation)  Date of Discharge: 02/17/2017 (hospital), 02/26/2017 (rehabilitation)  Discharged from: [facility]  Discharge Diagnosis: cerebrovascular accident (cryptogenic stroke)  Summary of Admission: presented to the ED on 02/12/2017 with LEFT sided weakness. He received tPA.  MRI revealed right MCA including right insular cortex and right parietal cortex small infarcts as well as right punctate cerebellar infarct.  CTA was negative except for a right upper lobe lung nodule.  2D echo revealed no cardiac source of emboli.  Bubble study revealed no evidence of patent foramen ovale.  Lower venous Doppler's were negative. ANA positive. Anti-DNA ds elevated. Cardiolipin IgG high positive. Lupus anticoagulant panel was elevated, and repeat testing recommended in 12+ weeks.  Beta-2 Glyco I IgG was elevated, potentially clinically significant for antiphospholipid syndrome. Lisinopril was added for improved blood pressure control.  Lipitor was started. Pepcid added for management of GERD symptoms. Glucose was elevated, A1C was 5.5%. Loop recorder was placed by cardiology on 02/16/2017. He was admitted to inpatient rehab for ongoing PT, OT and ST.  Aspirin 325 mg daily for secondary stroke prevention. Rehab resulted in improvement in mobility, strength and speech, as well as compensation mechanisms for persistent deficits.  TODAY's VISIT  Patient/Caregiver self-reported problems/concerns: no specific concerns today. The patient and his wife are following up with specialists, as instructed. He was  discharged only yesterday, so they are still collecting resources, like the shower chair.  Patient Active Problem List   Diagnosis Date Noted  . Hyperglycemia   . Poor nutrition   . Gastroesophageal reflux disease   . Hyperlipidemia   . Morbid obesity (HCC)   . 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 Cryptogenic stroke (HCC) 02/12/2017  . Allergic rhinitis 11/07/2016  . BMI 39.0-39.9,adult 11/07/2016  . Essential hypertension 10/18/2016    Past Medical History:  Diagnosis Date  . Environmental allergies   . Fall from roof    with kidney trauma  . Hypertension 2018  . Stroke [facility])    Allergies  Allergen Reactions  . Penicillins Other (See Comments)    Headache Has patient had a PCN reaction causing immediate rash, facial/tongue/throat swelling, SOB or lightheadedness with hypotension: No Has patient had a PCN reaction causing severe rash involving mucus membranes or skin necrosis: No Has patient had a PCN reaction that required hospitalization: No Has patient had a PCN reaction occurring within the last 10 years:No If all of the above answers are "NO", then may proceed with Cephalosporin use.    Past Surgical History:  Procedure Laterality Date  . LOOP RECORDER INSERTION N/A 02/16/2017   Procedure: LOOP RECORDER INSERTION;  Surgeon: Regan Lemming, MD;  Location: [facility] INVASIVE CV LAB;  Service: Cardiovascular;  Laterality: N/A;  . NO PAST SURGERIES    . TEE WITHOUT CARDIOVERSION N/A  02/16/2017   Procedure: TRANSESOPHAGEAL ECHOCARDIOGRAM (TEE);  Surgeon: Jake BatheSkains, Mark C, MD;  [facility];  Service: Cardiovascular;  Laterality: N/A;    MEDICATIONS Medication Reconciliation conducted with patient/caregiver? (Yes/ No):Yes  New medications prescribed/discontinued upon discharge? (Yes/No): Yes NEW: lisinopril, atorvastatin, ASA, famotidine, docusate sodium.  Barriers identified related to medications: none  LABS  Lab Reviewed (Yes/No/NA):  Yes   Review of Systems  Constitutional: Negative for chills and fever.  Respiratory: Negative for cough and shortness of breath.   Cardiovascular: Negative for chest pain, palpitations and leg swelling.  Gastrointestinal: Negative for diarrhea, nausea and vomiting.  Endocrine: Negative for polydipsia.  Genitourinary: Negative for dysuria, frequency and urgency.  Musculoskeletal: Negative for myalgias.  Skin: Negative for rash.  Neurological: Positive for weakness. Negative for dizziness and headaches.       LEFT sided weakness, improving. Some LEFT sided neglect. Speech is normal until he becomes fatigued.  Hematological: Negative.   Psychiatric/Behavioral: Negative.           Prior to Admission medications   Medication Sig Start Date End Date Taking? Authorizing Provider  acetaminophen (TYLENOL) 325 MG tablet Take 650 mg by mouth every 6 (six) hours as needed for mild pain.   Yes [provider]  amLODipine (NORVASC) 10 MG tablet Take 1 tablet (10 mg total) by mouth daily. 02/26/17  Yes Love, Evlyn Kanneramela S, PA-C  aspirin EC 325 MG EC tablet Take 1 tablet (325 mg total) by mouth daily. 02/26/17  Yes Love, Evlyn KannerPamela S, PA-C  atorvastatin (LIPITOR) 40 MG tablet Take 1 tablet (40 mg total) by mouth daily at 6 PM. 02/26/17  Yes Love, Evlyn KannerPamela S, PA-C  cetirizine (ZYRTEC) 10 MG tablet Take 10 mg by mouth daily as needed for allergies.   Yes [provider]  famotidine (PEPCID) 20 MG tablet Take 1 tablet (20 mg total) by mouth daily. 02/26/17  Yes Love, Evlyn KannerPamela S, PA-C  lisinopril (PRINIVIL,ZESTRIL) 20 MG tablet Take 1 tablet (20 mg total) by mouth daily. 02/26/17  Yes Love, Evlyn KannerPamela S, PA-C  docusate sodium (COLACE) 100 MG capsule Take 2 capsules (200 mg total) by mouth at bedtime. Patient not taking: Reported on 02/27/2017 02/26/17   Jacquelynn CreeLove, Pamela S, PA-C     Allergies  Allergen Reactions  . Penicillins Other (See Comments)    Headache Has patient had a PCN reaction causing  immediate rash, facial/tongue/throat swelling, SOB or lightheadedness with hypotension: No Has patient had a PCN reaction causing severe rash involving mucus membranes or skin necrosis: No Has patient had a PCN reaction that required hospitalization: No Has patient had a PCN reaction occurring within the last 10 years:No If all of the above answers are "NO", then may proceed with Cephalosporin use.        Objective:  Physical Exam  Constitutional: He is oriented to person, place, and time. He appears well-developed and well-nourished. He is active and cooperative. No distress.  BP 122/72 (BP Location: Right Arm, Patient Position: Sitting, Cuff Size: Large)   Pulse 97   Temp 99.1 F (37.3 C) (Oral)   Resp 18   Ht 5\' 11"  (1.803 m)   Wt 269 lb 12.8 oz (122.4 kg)   SpO2 97%   BMI 37.63 kg/m   HENT:  Head: Normocephalic and atraumatic.  Right Ear: Hearing normal.  Left Ear: Hearing normal.  Eyes: Conjunctivae are normal. No scleral icterus.  Neck: Normal range of motion. Neck supple. No thyromegaly present.  Cardiovascular: Normal rate, regular rhythm  and normal heart sounds.  Pulses:      Radial pulses are 2+ on the right side, and 2+ on the left side.  Pulmonary/Chest: Effort normal and breath sounds normal.  Lymphadenopathy:       Head (right side): No tonsillar, no preauricular, no posterior auricular and no occipital adenopathy present.       Head (left side): No tonsillar, no preauricular, no posterior auricular and no occipital adenopathy present.    He has no cervical adenopathy.       Right: No supraclavicular adenopathy present.       Left: No supraclavicular adenopathy present.  Neurological: He is alert and oriented to person, place, and time. No sensory deficit.  LEFT sided weakness is mild. Mild neglect of the LEFT.  Skin: Skin is warm, dry and intact. No rash noted. No cyanosis or erythema. Nails show no clubbing.  Psychiatric: He has a normal mood and affect. His  speech is normal and behavior is normal.           Assessment & Plan:   Problem List Items Addressed This Visit    Essential hypertension    Controlled on amlodipine and lisinopril. Continue.      Relevant Medications   amLODipine (NORVASC) 10 MG tablet   lisinopril (PRINIVIL,ZESTRIL) 20 MG tablet   atorvastatin (LIPITOR) 40 MG tablet   Allergic rhinitis    Controlled with cetirizine. Reminded against decongestants.      BMI 39.0-39.9,adult    Lost weight following stroke. Continue healthier eating. Avoid strenuous exercise until cleared by neurology.      Acute ischemic right MCA stroke [facility])    Follow-up with neurology as planned. May need rheumatology evaluation.      Relevant Medications   amLODipine (NORVASC) 10 MG tablet   lisinopril (PRINIVIL,ZESTRIL) 20 MG tablet   atorvastatin (LIPITOR) 40 MG tablet   Hyperlipidemia    COntinue atorvastatin.      Relevant Medications   amLODipine (NORVASC) 10 MG tablet   lisinopril (PRINIVIL,ZESTRIL) 20 MG tablet   atorvastatin (LIPITOR) 40 MG tablet   Gastroesophageal reflux disease    Controlled with famotidine.      Relevant Medications   famotidine (PEPCID) 20 MG tablet   Hyperglycemia    Healthy eating. Hold strenuous exercise until cleared by neurology. Update A1C in 05/2017.       Other Visit Diagnoses    Encounter for support and coordination of transition of care    -  Primary   Reconciled medications with the patient and his wife.  Reviewed his upcoming visits with specialists.  Reviewed plan for PT, OT and ST.   Abnormal findings on diagnostic imaging of central nervous system       CT scan of the chest to evaluate nodule found incidentally during evaluation of stroke.   Relevant Orders   CT Chest W Contrast     FOLLOW-UP (Include any further testing or referrals):  Cardiology: 03/02/2017 PT/OT/ST: 03/05/2017 Physical Medicine: 03/11/2017 Neurology: not yet scheduled. Encouraged patient and wife to  contact [facility] to schedule. Gastroenterology: advised to call to schedule screening colonoscopy.  Return in about 6 weeks (around 04/10/2017) for re-evaluation of blood pressure, etc.   Fernande Bras, PA-C Primary Care at [facility]

## 2017-02-27 NOTE — Patient Instructions (Addendum)
Please call [facility] to schedule a visit for colon cancer screening (colonoscopy). I referred you in 11/2016, left left messages, but didn't hear back from you. [phone]    IF you received an x-ray today, you will receive an invoice from [facility]. Please contact [facility] at [phone] with questions or concerns regarding your invoice.   IF you received labwork today, you will receive an invoice from [facility]. Please contact [facility] at [phone] with questions or concerns regarding your invoice.   Our billing staff will not be able to assist you with questions regarding bills from these companies.  You will be contacted with the lab results as soon as they are available. The fastest way to get your results is to activate your My Chart account. Instructions are located on the last page of this paperwork. If you have not heard from us regarding the results in 2 weeks, please contact this office.

## 2017-02-27 NOTE — Progress Notes (Signed)
Subjective:    Patient ID: [name], male    DOB: [birth year], 52 y.o.   MRN: [id number]  Chief Complaint  Patient presents with  . Hospitalization Follow-up    Pt states he was hospitalized for 2 weeks due to a stroke.    Patient was last seen on 11/26/17 for HTN. His BP had improved with increased dosage of Amlodipine at that time.   On 02/12/17, he was admitted for acute ischemic right MCA stroke including right insular cortex and right parietal cortex small infarcts as well as right punctate cerebellar infarct, embolic pattern, unclear source. Left mild hemiparesis, left facial droop.    CTA on 02/12/17 was negative for large or medium vessel occlusion. No sign of atherosclerotic vascular disease, dissection or other focal vascular finding. Incidental detection of indistinct nodular shadows on right upper lobe.   Brain MRI w/o contrast on 02/12/17 showed small areas of acute/early subacute cortical infarction in right posterior insula and right lateral parietal lobe. No hemorrhage or mass effect. Very small right superior cerebellar hemisphere subacute infarct.  Head CT w/o contrast on 02/13/17 showed no evidence of acute intracranial hemorrhage. B/L LE Doppler - negative for DVT.  TCD bubble study (02/13/17): no obvious evidence of PFO. TTE (02/13/17): EF 65% to 70%. No cardiac source of emboli identified. TEE (02/16/17): No cardiac source of emboli identified.  Loop recorder inserted on 02/16/17.  Hypercoagulable work up: pending positive lupus anticoagulant but no h/o DVT/PE Lovenox given for VTE prophylaxis.  Discharge to in-patient rehab on 02/17/17. Discharge exam: positive for left facial drrop, mild dysarthria, tongue protrusion to the left, 4/5 LUE strength and 5/5 LLE strength. Mild dysmetria on left FTN proportional to weakness.  Start Aspirin 325 mg QD. Restarted on Norvasc. LDL 137, goal <70 started Lipitor 40mg .  Active problems: needs outpatient chest CT to evaluate RUL  pulmonary nodules. Likely need outpatient sleep study for OSA. Repeat hypercoag panel and repeat lupus anticoagulant in 6-8 weeks.   Admitted to in-patient rehabilitation on 02/17/17 for PT, ST, and OT at least 3 hours 5 days a week.  Principal problem: acute ischemic right MCA stroke Active problems: essential HTN, hyperlipidemia, morbid obesity, GERD, hyperglycemia Discharged condition: stable  Copied from rehab note: "Rehab course: During patient's stay in rehab weekly team conferences were held to monitor patient's progress, set goals and discuss barriers to discharge. At admission, patient required min assist with ADL tasks and total assist with mobility. He exhibited mild dysarthria and mild higher level executive deficits. He has had improvement in activity tolerance, balance, postural control, as well as ability to compensate for deficits. He has had improvement in functional use LUE  And LLE as well as improvement in  Awareness. He is able to complete ADL tasks at modified independent level. He is modified independent for transfers and to ambulate 300' without AD. He has had improvement in recall, problem solving and awareness. He is modified independent for semi complex cognitive task and speech intelligibility has improved with use of compensatory strategies."  Diet:heart healthy No driving or strenuous activity til cleared by MD.  Discharged to home on 02/26/17.  Slept well at home last night. He has OT/PT next Thursday, January 24. ST on Monday, January 28. Neuro f/u in 4 weeks. His speech is good until he gets tired and there is left sided drooping. Left hand "feels like its asleep", but working on exercises and tasks list provided by therapy. L leg "bends a little slow,  gets tired, and doesn't pick up as well." He is having no problem with daily tasks. He was able to shower on his own while at rehab and made cookies on Wednesday in rehab. Glad to be home. Wife very supportive and caring. She  is making sure he uses his left hand. They bought connect four to play together.   Review of Systems As above.   Patient Active Problem List   Diagnosis Date Noted  . Hyperglycemia   . Poor nutrition   . Gastroesophageal reflux disease   . Hyperlipidemia   . Morbid obesity (HCC)   . 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 Cryptogenic stroke (HCC) 02/12/2017  . Allergic rhinitis 11/07/2016  . BMI 39.0-39.9,adult 11/07/2016  . Essential hypertension 10/18/2016   Prior to Admission medications   Medication Sig Start Date End Date Taking? Authorizing Provider  amLODipine (NORVASC) 10 MG tablet Take 1 tablet (10 mg total) by mouth daily. 02/27/17  Yes Jeffery, Chelle, PA-C  aspirin EC 325 MG EC tablet Take 1 tablet (325 mg total) by mouth daily. 02/26/17  Yes Love, Evlyn KannerPamela S, PA-C  atorvastatin (LIPITOR) 40 MG tablet Take 1 tablet (40 mg total) by mouth daily at 6 PM. 02/27/17  Yes Jeffery, Chelle, PA-C  cetirizine (ZYRTEC) 10 MG tablet Take 10 mg by mouth daily as needed for allergies.   Yes [provider]  famotidine (PEPCID) 20 MG tablet Take 1 tablet (20 mg total) by mouth daily. 02/27/17  Yes Jeffery, Chelle, PA-C  lisinopril (PRINIVIL,ZESTRIL) 20 MG tablet Take 1 tablet (20 mg total) by mouth daily. 02/27/17  Yes Jeffery, Chelle, PA-C  acetaminophen (TYLENOL) 325 MG tablet Take 650 mg by mouth every 6 (six) hours as needed for mild pain.    [provider]   Allergies  Allergen Reactions  . Penicillins Other (See Comments)    Headache Has patient had a PCN reaction causing immediate rash, facial/tongue/throat swelling, SOB or lightheadedness with hypotension: No Has patient had a PCN reaction causing severe rash involving mucus membranes or skin necrosis: No Has patient had a PCN reaction that required hospitalization: No Has patient had a PCN reaction occurring within the last 10 years:No If all of the above answers are "NO", then may proceed with  Cephalosporin use.       Objective:   Physical Exam  Constitutional: He is oriented to person, place, and time. He appears well-developed and well-nourished.  Neck: No JVD present.  Cardiovascular: Normal rate, regular rhythm, normal heart sounds and intact distal pulses.  Pulses:      Radial pulses are 2+ on the right side, and 2+ on the left side.       Posterior tibial pulses are 2+ on the right side, and 2+ on the left side.  Pulmonary/Chest: Effort normal and breath sounds normal.  Musculoskeletal: He exhibits no edema.  Lymphadenopathy:    He has no cervical adenopathy.  Neurological: He is alert and oriented to person, place, and time. Coordination and gait normal.  Psychiatric: He has a normal mood and affect. His behavior is normal.       Assessment & Plan:  1. Encounter for support and coordination of transition of care 2. Acute ischemic right MCA stroke (HCC) Continue with PT,OT, and ST. Continue to work on improving strength and coordination of left side.   3. Essential hypertension - amLODipine (NORVASC) 10 MG tablet; Take 1 tablet (10 mg total) by mouth daily.  Dispense: 90 tablet;  Refill: 3 - lisinopril (PRINIVIL,ZESTRIL) 20 MG tablet; Take 1 tablet (20 mg total) by mouth daily.  Dispense: 90 tablet; Refill: 3  4. Allergic rhinitis, unspecified seasonality, unspecified trigger Continue Cetirizine as needed.   5. Gastroesophageal reflux disease, esophagitis presence not specified - famotidine (PEPCID) 20 MG tablet; Take 1 tablet (20 mg total) by mouth daily.  Dispense: 90 tablet; Refill: 3  6. BMI 39.0-39.9,adult Continue with heart healthy diet.   7. Hyperlipidemia, unspecified hyperlipidemia type - atorvastatin (LIPITOR) 40 MG tablet; Take 1 tablet (40 mg total) by mouth daily at 6 PM.  Dispense: 90 tablet; Refill: 3  8. Abnormal findings on diagnostic imaging of central nervous system CTA (02/12/17) showed incidental detection of indistinct nodular shadows on  right upper lobe.  - CT Chest W Contrast; Future  Schedule colonoscopy for [facility].  Return in about 6 weeks (around 04/10/2017) for re-evaluation of blood pressure, etc.  Alfonse Alpers, PA-S

## 2017-03-02 ENCOUNTER — Ambulatory Visit (INDEPENDENT_AMBULATORY_CARE_PROVIDER_SITE_OTHER): Payer: Self-pay | Admitting: *Deleted

## 2017-03-02 DIAGNOSIS — I639 Cerebral infarction, unspecified: Secondary | ICD-10-CM

## 2017-03-02 LAB — CUP PACEART INCLINIC DEVICE CHECK
Date Time Interrogation Session: 20190121162419
Implantable Pulse Generator Implant Date: 20190107

## 2017-03-02 NOTE — Progress Notes (Signed)
Wound Loop check in clinic. Steri-Strips removed, incision edges approximated no redness or edema. Battery status: Good . R-waves 0.6530mV. 0 symptom episodes, 0 tachy episodes, Pause and brady off at  implant. 0 AF episodes. Monthly summary reports and ROV with WC PRN

## 2017-03-02 NOTE — Assessment & Plan Note (Signed)
Follow-up with neurology as planned. May need rheumatology evaluation.

## 2017-03-02 NOTE — Assessment & Plan Note (Signed)
COntinue atorvastatin.

## 2017-03-02 NOTE — Assessment & Plan Note (Signed)
Lost weight following stroke. Continue healthier eating. Avoid strenuous exercise until cleared by neurology.

## 2017-03-02 NOTE — Assessment & Plan Note (Signed)
Controlled on amlodipine and lisinopril. Continue.

## 2017-03-02 NOTE — Assessment & Plan Note (Signed)
Controlled with famotidine

## 2017-03-02 NOTE — Assessment & Plan Note (Signed)
Healthy eating. Hold strenuous exercise until cleared by neurology. Update A1C in 05/2017.

## 2017-03-02 NOTE — Assessment & Plan Note (Signed)
Controlled with cetirizine. Reminded against decongestants.

## 2017-03-03 ENCOUNTER — Telehealth: Payer: Self-pay

## 2017-03-03 NOTE — Telephone Encounter (Signed)
Copied from CRM [phone]. Topic: Inquiry >> Mar 03, 2017 10:51 AM Windy KalataMichael, Taylor L, NT wrote: Reason for CRM: patient is calling stating he sent a fax over 03/02/17 and would like to know if Theora Gianottihelle Jeffrey received it. Please advise

## 2017-03-05 ENCOUNTER — Ambulatory Visit: Payer: BLUE CROSS/BLUE SHIELD | Admitting: Occupational Therapy

## 2017-03-05 ENCOUNTER — Telehealth: Payer: Self-pay | Admitting: Physician Assistant

## 2017-03-05 ENCOUNTER — Ambulatory Visit: Payer: BLUE CROSS/BLUE SHIELD | Attending: Physical Medicine & Rehabilitation

## 2017-03-05 ENCOUNTER — Other Ambulatory Visit: Payer: Self-pay

## 2017-03-05 ENCOUNTER — Ambulatory Visit: Payer: BLUE CROSS/BLUE SHIELD | Admitting: Speech Pathology

## 2017-03-05 DIAGNOSIS — M6281 Muscle weakness (generalized): Secondary | ICD-10-CM | POA: Insufficient documentation

## 2017-03-05 DIAGNOSIS — I69318 Other symptoms and signs involving cognitive functions following cerebral infarction: Secondary | ICD-10-CM | POA: Diagnosis present

## 2017-03-05 DIAGNOSIS — I69954 Hemiplegia and hemiparesis following unspecified cerebrovascular disease affecting left non-dominant side: Secondary | ICD-10-CM | POA: Diagnosis present

## 2017-03-05 DIAGNOSIS — R2689 Other abnormalities of gait and mobility: Secondary | ICD-10-CM | POA: Insufficient documentation

## 2017-03-05 DIAGNOSIS — R208 Other disturbances of skin sensation: Secondary | ICD-10-CM | POA: Diagnosis present

## 2017-03-05 DIAGNOSIS — R278 Other lack of coordination: Secondary | ICD-10-CM

## 2017-03-05 DIAGNOSIS — R41841 Cognitive communication deficit: Secondary | ICD-10-CM

## 2017-03-05 NOTE — Therapy (Signed)
[facility] [address] [place], [place], [address] Phone: [phone]   Fax:  [phone]  Speech Language Pathology Evaluation  Patient Details  Name: [name] MRN: [id number] Date of Birth: 07/04/1965 Referring Provider: Dr. Maryla Morrow   Encounter Date: 03/05/2017  End of Session - 03/05/17 1304    Visit Number  1    Number of Visits  8    Date for SLP Re-Evaluation  04/10/17    SLP Start Time  1102    SLP Stop Time   1147    SLP Time Calculation (min)  45 min    Activity Tolerance  Patient tolerated treatment well       Past Medical History:  Diagnosis Date  . Environmental allergies   . Fall from roof    with kidney trauma  . Hypertension 2018  . Stroke [facility])     Past Surgical History:  Procedure Laterality Date  . LOOP RECORDER INSERTION N/A 02/16/2017   Procedure: LOOP RECORDER INSERTION;  Surgeon: Regan Lemming, MD;  [facility] INVASIVE CV LAB;  Service: Cardiovascular;  Laterality: N/A;  . NO PAST SURGERIES    . TEE WITHOUT CARDIOVERSION N/A 02/16/2017   Procedure: TRANSESOPHAGEAL ECHOCARDIOGRAM (TEE);  Surgeon: Jake Bathe, MD;  Location: [facility] ENDOSCOPY;  Service: Cardiovascular;  Laterality: N/A;    There were no vitals filed for this visit.  Subjective Assessment - 03/05/17 1104    Subjective  "A little bit when I get tired my left side kind of droops" pt, re: deficits    Currently in Pain?  No/denies         SLP Evaluation [facility] - 03/05/17 1104      SLP Visit Information   SLP Received On  03/05/17    Referring Provider  Dr. Maryla Morrow    Onset Date  02/20/17    Medical Diagnosis  CVA      General Information   HPI  Jesus Detter IIIis a 52 y.o.malewith history of HTN who was admitted to [facility] on 02/12/17 with left facial and left sided weakness with numbness.CT head negative with question areas of low density right cerebellum--recent or old infarcts. He received tPA  and followMRI brain done showing "small areas of acute/early subacute cortical infarction in right posterior insula and right lateral parietal lobeas well as very small right superior cerebellar subacute infarct". Pt received ST in CIR adressing memory, problem solving, and dysarthria.    Behavioral/Cognition  alert, cooperative    Mobility Status  ambulated to session; PT eval today      Balance Screen   Has the patient fallen in the past 6 months  Yes    How many times?  1    Has the patient had a decrease in activity level because of a fear of falling?   No    Is the patient reluctant to leave their home because of a fear of falling?   No      Prior Functional Status   Cognitive/Linguistic Baseline  Within functional limits    Type of Home  House     Lives With  Spouse    Available Support  Family    Vocation  Full time employment currently on leave      Pain Assessment   Pain Assessment  No/denies pain      Cognition   Overall Cognitive Status  Impaired/Different from baseline    Area of Impairment  Problem solving  reasoning, executive function    Problem Solving  Slow processing    Attention  Selective attention to detail impaired    Selective Attention  Appears intact    Memory  Appears intact    Awareness  Appears intact    Problem Solving  Impaired    Problem Solving Impairment  Verbal complex;Functional complex    Tree surgeon Impairment  Verbal complex;Functional complex      Auditory Comprehension   Overall Auditory Comprehension  Appears within functional limits for tasks assessed      Visual Recognition/Discrimination   Discrimination  Within Function Limits      Expression   Primary Mode of Expression  Verbal      Verbal Expression   Overall Verbal Expression  Appears within functional limits for tasks assessed      Written Expression   Dominant Hand  Right    Written Expression  Not tested       Oral Motor/Sensory Function   Overall Oral Motor/Sensory Function  Impaired    Labial ROM  Reduced left    Labial Symmetry  Abnormal symmetry left    Labial Strength  Within Functional Limits    Labial Coordination  WFL    Lingual ROM  Within Functional Limits    Lingual Symmetry  Within Functional Limits    Lingual Strength  Within Functional Limits    Lingual Sensation  Within Functional Limits    Lingual Coordination  WFL    Facial ROM  Reduced left    Facial Symmetry  Left droop    Facial Strength  Within Functional Limits    Facial Sensation  Within Functional Limits    Velum  Within Functional Limits    Mandible  Within Functional Limits    Overall Oral Motor/Sensory Function  mild impairment      Motor Speech   Overall Motor Speech  Impaired    Respiration  Within functional limits    Phonation  Normal    Resonance  Within functional limits    Articulation  Impaired    Level of Impairment  Conversation    Intelligibility  Intelligible    Conversation  75-100% accurate    Motor Planning  Witnin functional limits    Motor Speech Errors  Aware;Consistent    Effective Techniques  Slow rate;Increased vocal intensity;Over-articulate;Pause    Phonation  WFL      Standardized Assessments   Standardized Assessments   Cognitive Linguistic Quick Test      Cognitive Linguistic Quick Test (Ages 18-69)   Attention  WNL    Memory  WNL    Executive Function  WNL    Language  WNL    Visuospatial Skills  WNL    Severity Rating Total  20    Composite Severity Rating  16.8                      SLP Education - 03/05/17 1304    Education provided  Yes    Education Details  proposed ST goals, frequency/duration of ST    Person(s) Educated  Patient    Methods  Explanation    Comprehension  Verbalized understanding         SLP Long Term Goals - 03/05/17 1311      SLP LONG TERM GOAL #1   Title  Pt will complete moderately complex time/money, reasoning/problem  solving tasks with  90% accuracy    Time  4    Period  Weeks    Status  New      SLP LONG TERM GOAL #2   Title  Pt will identify and correct errors in cognitive linguistic tasks in 80% of opportunities.     Time  4    Period  Weeks    Status  New      SLP LONG TERM GOAL #3   Title  Pt will demo attention to detail necessary for 100% success in detailed linguistic tasks given self correction, over three sessions    Time  4    Period  Weeks    Status  New       Plan - 03/05/17 1304    Clinical Impression Statement  [name] presents today with higher level cognitive deficits in reasoning, problem solving, organization and attention to detail. Pt scored within normal limits on Cognitive Linguistic Quick Test, however in supplemental assessments pt with slower processing, errors in complex reasoning and problem solving tasks. 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 Pt able to locate and correct errors with min SLP question cues. Prior to CVA pt worked in [employer]; he is hoping to return to work. I recommend skilled ST to target the above impairments in order to maxmize cognitive function for return to work.    Speech Therapy Frequency  2x / week    Duration  4 weeks    Treatment/Interventions  Compensatory strategies;Patient/family education;SLP instruction and feedback;Functional tasks;Internal/external aids;Compensatory techniques    Potential to Achieve Goals  Good    Consulted and Agree with Plan of Care  Patient       Patient will benefit from skilled therapeutic intervention in order to improve the following deficits and impairments:   Cognitive communication deficit    Problem List Patient Active Problem List   Diagnosis Date Noted  . Hyperglycemia   . Poor nutrition   . Gastroesophageal reflux disease   . Hyperlipidemia   . Morbid obesity (HCC)   . 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 Cryptogenic stroke (HCC) 02/12/2017  . Allergic  rhinitis 11/07/2016  . BMI 39.0-39.9,adult 11/07/2016  . Essential hypertension 10/18/2016   Rondel BatonMary Beth Jayion Schneck, MS, CCC-SLP Speech-Language Pathologist  Arlana LindauMary E Webb Weed 03/05/2017, 1:15 PM  [facility] [address] [place], [place], [address] Phone: [phone]   Fax:  [phone]  Name: Sharlett IlesChester Demario Li MRN: [id number] Date of Birth: 29-Sep-1965

## 2017-03-05 NOTE — Therapy (Signed)
[facility] [address] [place], [place], [address] Phone: [phone]   Fax:  [phone]  Physical Therapy Evaluation  Patient Details  Name: [name] MRN: [id number] Date of Birth: January 30, 1966 Referring Provider: Dr. Allena Katz   Encounter Date: 03/05/2017  PT End of Session - 03/05/17 1201    Visit Number  1    Number of Visits  9    Date for PT Re-Evaluation  04/04/17    Authorization Type  [employer]-90 visits combined (all 3 disciplines)     PT Start Time  1016    PT Stop Time  1058    PT Time Calculation (min)  42 min    Equipment Utilized During Treatment  Gait belt    Activity Tolerance  Patient tolerated treatment well    Behavior During Therapy  [facility] for tasks assessed/performed       Past Medical History:  Diagnosis Date  . Environmental allergies   . Fall from roof    with kidney trauma  . Hypertension 2018  . Stroke [facility])     Past Surgical History:  Procedure Laterality Date  . LOOP RECORDER INSERTION N/A 02/16/2017   Procedure: LOOP RECORDER INSERTION;  Surgeon: Regan Lemming, MD;  [facility];  Service: Cardiovascular;  Laterality: N/A;  . NO PAST SURGERIES    . TEE WITHOUT CARDIOVERSION N/A 02/16/2017   Procedure: TRANSESOPHAGEAL ECHOCARDIOGRAM (TEE);  Surgeon: Jake Bathe, MD;  Location: [facility] ENDOSCOPY;  Service: Cardiovascular;  Laterality: N/A;    There were no vitals filed for this visit.   Subjective Assessment - 03/05/17 1027    Subjective  Pt reported he had a R CVA 3 weeks ago and L side is now weak and sensory issues. He experiences some sensory issues in LUE and LLE and has noticed decr. speed and balance issues. Pt has two dogs and is able to pick up his dogs (small dogs-15 lbs.), he walks dogs short distances. Pt works full-time in Counselling psychologist. Pt participated in CIR after CVA. Pt denied dizziness or falls but pt does experience intermittent  lightheadedness upon standing.     Pertinent History  HTN, hx fall from roof with kidney trauma    Patient Stated Goals  Walk better and improve balance.     Currently in Pain?  No/denies         [facility] PT Assessment - 03/05/17 1033      Assessment   Medical Diagnosis  CVA    Referring Provider  Dr. Allena Katz    Onset Date/Surgical Date  02/12/17    Hand Dominance  Right    Next MD Visit  04/27/17 with Dr. Marjory Lies    Prior Therapy  CIR 02/17/17 - 02/26/17      Precautions   Precaution Comments  NO strenuous activity, no driving, loop recorder. PT and pt will verify exercise parameters with MD.       Restrictions   Weight Bearing Restrictions  No      Balance Screen   Has the patient fallen in the past 6 months  Yes    How many times?  1 pt was sick (N/V) so he was weak and it was raining    Has the patient had a decrease in activity level because of a fear of falling?   No    Is the patient reluctant to leave their home because of a fear of falling?   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 light touch in LLE      Coordination   Gross Motor  Movements are Fluid and Coordinated  Yes    Fine Motor Movements are Fluid and Coordinated  No    Heel Shin Test  L heel to shin slower speed      Tone   Assessment Location  Left Lower Extremity      ROM / Strength   AROM / PROM / Strength  AROM;Strength      AROM   Overall AROM   Deficits    Overall AROM Comments  BLE AROM WFL except for limited L DF.      Strength   Overall Strength Comments  RLE: 5/5 strength. LLE: hip flex: 4/5, knee ext: 4/5, knee flex: 4/5, ankle DF: 2/5.       Transfers   Transfers  Sit to Stand;Stand to Sit    Sit to Stand  7: Independent;From chair/3-in-1;Without upper extremity assist    Stand to Sit  7: Independent;Without upper extremity assist;To chair/3-in-1      Ambulation/Gait   Ambulation/Gait  Yes    Ambulation/Gait Assistance  5: Supervision    Ambulation/Gait Assistance Details  Pt noted to amb. with L toe in and decr. trunk rotation and L arm swing. S to ensure safety.    Ambulation Distance (Feet)  200 Feet    Assistive device  None    Gait Pattern  Step-through pattern;Decreased stride length;Decreased arm swing - left;Decreased dorsiflexion - left;Decreased trunk rotation    Ambulation Surface  Level;Indoor    Gait velocity  3.2555ft/sec.       Functional Gait  Assessment   Gait assessed   Yes    Gait Level Surface  Walks 20 ft in less than 5.5 sec, no assistive devices, good speed, no evidence for imbalance, normal gait pattern, deviates no more than 6 in outside of the 12 in walkway width. 5.66 sec.     Change in Gait Speed  Able to change speed, demonstrates mild gait deviations, deviates 6-10 in outside of the 12 in walkway width, or no gait deviations, unable to achieve a major change in velocity, or uses a change in velocity, or uses an assistive device.    Gait with Horizontal Head Turns  Performs head turns smoothly with slight change in gait velocity (eg, minor disruption to smooth gait path), deviates 6-10 in  outside 12 in walkway  width, or uses an assistive device.    Gait with Vertical Head Turns  Performs task with slight change in gait velocity (eg, minor disruption to smooth gait path), deviates 6 - 10 in outside 12 in walkway width or uses assistive device    Gait and Pivot Turn  Pivot turns safely within 3 sec and stops quickly with no loss of balance.    Step Over Obstacle  Is able to step over 2 stacked shoe boxes taped together (9 in total height) without changing gait speed. No evidence of imbalance.    Gait with Narrow Base of Support  Ambulates less than 4 steps heel to toe or cannot perform without assistance.    Gait with Eyes Closed  Walks 20 ft, slow speed, abnormal gait pattern, evidence for imbalance, deviates 10-15 in outside 12 in walkway width. Requires more than 9 sec to ambulate 20 ft. 9.5 sec.    Ambulating Backwards  Walks 20 ft, uses assistive device, slower speed, mild gait deviations, deviates 6-10 in outside 12 in walkway width.    Steps  Alternating feet, no rail.    Total Score  21    FGA comment:  21/30: indicates pt is at a moderate falls risk. Pt reported LLE N/T after amb. longer distances, and gait deviations more pronounced.       LLE Tone   LLE Tone  Within Functional Limits             Objective measurements completed on examination: See above findings.              PT Education - 03/05/17 1200    Education provided  Yes    Education Details  PT discussed frequency, duration, and POC. PT educated pt on outcome measures.     Person(s) Educated  Patient    Methods  Explanation    Comprehension  Verbalized understanding       PT Short Term Goals - 03/05/17 1206      PT SHORT TERM GOAL #1   Title  same as LTGs        PT Long Term Goals - 03/05/17 1206      PT LONG TERM GOAL #1   Title  Pt will be IND in HEP to improve deficits listed above. TARGET DATE FOR ALL LTGS: 04/02/17    Status  New      PT LONG TERM GOAL #2   Title  Pt will improve FGA score  to >/=28/30 to decr. falls risk.     Status  New      PT LONG TERM GOAL #3   Title  Pt will amb. 1000', IND, over even/uneven terrain to improve functional mobility.     Status  New      PT LONG TERM GOAL #4   Title  Pt will verbalize understanding of CVA risk factors and s/s to prevent additional CVA.     Status  New      PT LONG TERM GOAL #5   Title  Pt will report he is joining a fitness center upon d/c from PT in order to maintain gains made during PT.     Status  New             Plan - 03/05/17 1202    Clinical Impression Statement  Pt is a pleasant 51y/o male presenting s/p R CVA with L sided weakness and sensory deficits. Pt's PMH is significant for  the following: HTN and kidney injury after falling off roof. Pt's gait speed indicates pt is able to amb. in the community. Pt's FGA score indicates pt is at a moderate falls risk. The following deficits were noted upon exam: gait deviations, impaired coordination, impaired balance, decr. strength (LLE), decr. L ankle AROM, impaired sensation, and decr. endurance. Pt would benefit from skilled PT to improve safety during functional mobility.     History and Personal Factors relevant to plan of care:  Pt works full-time and this requires a lot of driving. Pt's wife works.    Clinical Presentation  Stable    Clinical Presentation due to:  HTN, hx of kidney injury after falling off roof    Clinical Decision Making  Moderate    Rehab Potential  Good    Clinical Impairments Affecting Rehab Potential  see above    PT Frequency  2x / week    PT Duration  4 weeks    PT Treatment/Interventions  ADLs/Self Care Home Management;Biofeedback;Canalith Repostioning;Therapeutic activities;Therapeutic exercise;Functional mobility training;Stair training;Gait training;DME Instruction;Neuromuscular re-education;Balance training;Patient/family education;Orthotic Fit/Training;Vestibular;Manual techniques    PT Next Visit Plan  Initiate balance,  strength, flexibility and walking program HEP. Provide CVA ed.     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 Hyperglycemia   . Poor nutrition   . Gastroesophageal reflux disease   . Hyperlipidemia   . Morbid obesity (HCC)   . 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 Cryptogenic stroke (HCC) 02/12/2017  . Allergic rhinitis 11/07/2016  . BMI 39.0-39.9,adult 11/07/2016  . Essential hypertension 10/18/2016    Tramain Gershman L 03/05/2017, 12:10 PM  [facility] [address] [place], [place], [address] Phone: [phone]   Fax:  [phone]  Name: Daejon Lich Li MRN: [id number] Date of Birth: 07/15/1965  Zerita Boers, PT,DPT 03/05/17 12:11 PM Phone: [phone] Fax: [phone]

## 2017-03-05 NOTE — Therapy (Signed)
[facility] [address] [place], [place], [address] Phone: [phone]   Fax:  [phone]  Occupational Therapy Evaluation  Patient Details  Name: [name] MRN: [id number] Date of Birth: 1966/01/25 Referring Provider: Dr. Allena Katz   Encounter Date: 03/05/2017  OT End of Session - 03/05/17 1253    Visit Number  1    Number of Visits  17    Date for OT Re-Evaluation  05/02/17    Authorization Type  BC/BS    Authorization Time Period  90 combined    OT Start Time  0930    OT Stop Time  1015    OT Time Calculation (min)  45 min    Activity Tolerance  Patient tolerated treatment well    Behavior During Therapy  [facility] for tasks assessed/performed       Past Medical History:  Diagnosis Date  . Environmental allergies   . Fall from roof    with kidney trauma  . Hypertension 2018  . Stroke [facility])     Past Surgical History:  Procedure Laterality Date  . LOOP RECORDER INSERTION N/A 02/16/2017   Procedure: LOOP RECORDER INSERTION;  Surgeon: Regan Lemming, MD;  [facility]: [facility] INVASIVE CV LAB;  Service: Cardiovascular;  Laterality: N/A;  . NO PAST SURGERIES    . TEE WITHOUT CARDIOVERSION N/A 02/16/2017   Procedure: TRANSESOPHAGEAL ECHOCARDIOGRAM (TEE);  Surgeon: Jake Bathe, MD;  Location: [facility] ENDOSCOPY;  Service: Cardiovascular;  Laterality: N/A;    There were no vitals filed for this visit.  Subjective Assessment - 03/05/17 0941    Pertinent History  CVA 02/12/17, HTN     Limitations  **Loop recorder, no driving, no strenuous activtity    Patient Stated Goals  Get back to work, get back to driving    Currently in Pain?  No/denies        [facility] OT Assessment - 03/05/17 0001      Assessment   Medical Diagnosis  CVA Lt sided weakness    Referring Provider  Dr. Maryla Morrow    Onset Date/Surgical Date  02/12/17    Hand Dominance  Right    Next MD Visit  04/27/17 neurologist    Prior Therapy  CIR 02/17/17 -  02/26/17      Precautions   Precaution Comments  NO strenuous activity, no driving, loop recorder      Balance Screen   Has the patient fallen in the past 6 months  Yes    How many times?  1    Has the patient had a decrease in activity level because of a fear of falling?   No    Is the patient reluctant to leave their home because of a fear of falling?   No      Home  Environment   Bathroom Shower/Tub  Tub/Shower unit sliding doors, stool    Additional Comments  Pt lives in 1 story house with only threshhold to enter.     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, watch sports events      ADL   Eating/Feeding  Modified independent difficulty cutting  food    Grooming  Modified independent    Upper Body Bathing  Independent    Lower Body Bathing  Independent    Upper Body Dressing  Increased time    Lower Body Dressing  Minimal assistance for tying shoes, mostly wearing slip on shoes    Toilet Transfer  Independent    Toileting -  Hygiene  Independent    Tub/Shower Transfer  Modified independent w/ grab bar    Psychologist, educational  Grab bars;Shower seat without back      IADL   Shopping  Needs to be accompanied on any shopping trip    Light Housekeeping  Performs light daily tasks such as dishwashing, bed making    Meal Prep  Able to complete simple warm meal prep;Able to complete simple cold meal and snack prep pt requires assist for cooking now, but did I'ly prior to CVA    Community Mobility  Relies on family or friends for transportation    Medication Management  Is responsible for taking medication in correct dosages at correct time    Physicist, medical financial matters independently  (budgets, writes checks, pays rent, bills goes to bank), collects and keeps track of income (per pt report)     Written Expression   Dominant Hand  Right    Handwriting  -- Denies change      Vision - History   Baseline Vision  Wears glasses all the time    Additional Comments  denies change from CVA      Cognition   Overall Cognitive Status  Within Functional Limits for tasks assessed Pt verbose t/o evaluation - ? personality vs. perseveration    Cognition Comments  See speech evaluation for mild higher level cognitive deficits (executive functioning, organizational, problem solving, attention to detail deficits) - O.T. will address as it relates to cooking, work related tasks, and preparation for driving      Sensation   Light Touch  Impaired by gross assessment;Impaired Detail    Light Touch Impaired Details  Impaired LUE unable to localize    Stereognosis  -- abscent    Hot/Cold  Impaired by gross assessment    Proprioception  Impaired by gross assessment    Additional Comments  unable to detect stimulus on Lt with 2 stimuli BUE's      Coordination   Gross Motor Movements are Fluid and Coordinated  -- mildly impaired Lt    Fine Motor Movements are Fluid and Coordinated  No    9 Hole Peg Test  Right;Left    Right 9 Hole Peg Test  24.19 sec    Left 9 Hole Peg Test  55.53 sec (multiple drops partly d/t sensation deficits)    Coordination  Pt does have in hand manipulation but drops a lot d/t sensory deficits      Edema   Edema  none in UE's      Tone   Assessment Location  Left Upper Extremity      ROM / Strength   AROM / PROM / Strength  AROM;Strength      AROM   Overall AROM Comments  BUE AROM WNL's except 90% IR LUE w/ mild pain only during IR - pt reports he fell on that Lt shoulder when the stroke happen      Strength   Overall Strength Comments  MMT RUE 5/5, LUE 4+/5 grossly      Hand Function   Right Hand Grip (lbs)  60 avg    Left Hand Grip (lbs)  47 avg       LUE Tone   LUE Tone  Mild ONLY w/ elbow ext                        OT Short Term Goals - 03/05/17 1611      OT SHORT TERM GOAL #1   Title  Independent with coordination and putty HEP     Time  4    Period  Weeks    Status  New    Target Date  04/04/17      OT SHORT TERM GOAL #2   Title  Pt to verbalize safety considerations with sensory loss Lt side    Time  4    Period  Weeks    Status  New      OT SHORT TERM GOAL #3   Title  Pt to tie shoes mod I level with extra time as needed    Time  4    Period  Weeks    Status  New      OT SHORT TERM GOAL #4   Title  Pt to divide attention between environmental scanning and physical or cognitive task with 90% accuracy    Time  4    Period  Weeks    Status  New      OT SHORT TERM GOAL #5   Title  Pt to improve grip strength Lt hand to 52 lbs average or higher    Baseline  47 lbs    Time  4    Period  Weeks    Status  New        OT Long Term Goals - 03/05/17 1616      OT LONG TERM GOAL #1   Title  Pt to be independent with theraband HEP for LUE    Time  8    Period  Weeks    Status  New    Target Date  05/02/17      OT LONG TERM GOAL #2   Title  Pt to improve coordination Lt hand as evidenced by reducing speed on 9 hole peg test to 40 sec. or less    Baseline  55.53 sec.     Time  8    Period  Weeks    Status  New      OT LONG TERM GOAL #3   Title  Pt to return to cooking full meal demo safety, organization, planning at mod I level with extra time as needed    Time  8    Period  Weeks    Status  New      OT LONG TERM GOAL #4   Title  Pt to demo data entry and other computer tasks necessary to perform work related tasks with 100% accuracy    Time  8    Period  Weeks    Status  New            Plan - 03/05/17 1257    Clinical Impression Statement  Pt is a 52 y.o. male who presents to outpatient rehab s/p CVA on 02/12/17 with decreased coordination, strength, and sensation Lt non dominant  side. Pt also with mild cognitive deficits that may impede IADL tasks, work related tasks and preparation for driving    Occupational Profile and client history currently impacting functional performance  HTN, loop recorder placed. Current  deficits limiting pt's ability to perform IADLS and work related tasks    Occupational performance deficits (Please refer to evaluation for details):  ADL's;IADL's;Work    Rehab Potential  Good    OT Frequency  2x / week    OT Duration  8 weeks plus eval    OT Treatment/Interventions  Self-care/ADL training;DME and/or AE instruction;Therapeutic activities;Therapeutic exercise;Neuromuscular education;Patient/family education;Cognitive remediation/compensation;Fluidtherapy;Functional Mobility Training    Plan  coordination and putty HEP, safety w/ sensory loss     Clinical Decision Making  Several treatment options, min-mod task modification necessary    Consulted and Agree with Plan of Care  Patient       Patient will benefit from skilled therapeutic intervention in order to improve the following deficits and impairments:  Decreased coordination, Impaired sensation, Impaired UE functional use, Decreased strength, Decreased activity tolerance, Decreased cognition  Visit Diagnosis: Other lack of coordination - Plan: Ot plan of care cert/re-cert  Muscle weakness (generalized) - Plan: Ot plan of care cert/re-cert  Other disturbances of skin sensation - Plan: Ot plan of care cert/re-cert  Other symptoms and signs involving cognitive functions following cerebral infarction - Plan: Ot plan of care cert/re-cert    Problem List Patient Active Problem List   Diagnosis Date Noted  . Hyperglycemia   . Poor nutrition   . Gastroesophageal reflux disease   . Hyperlipidemia   . Morbid obesity (HCC)   . 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 Cryptogenic stroke (HCC) 02/12/2017  . Allergic rhinitis 11/07/2016  . BMI 39.0-39.9,adult 11/07/2016  .  Essential hypertension 10/18/2016    Kelli ChurnBallie, Cantrell Martus Johnson, OTR/L 03/05/2017, 4:23 PM  [facility] [address] [place], [place], [address] Phone: [phone]   Fax:  [phone]  Name: Sharlett IlesChester Biswas Li MRN: [id number] Date of Birth: November 04, 1965

## 2017-03-05 NOTE — Telephone Encounter (Signed)
Patient needs Disability forms and a handicap parking form completed by Chelle for his recent stroke. I have completed what I could from the OV notes and highlighted the areas I was unsure about. I will place the forms in Chelle's box on 03/05/17 please return to the FMLA/DISABILITY desk within 5-7 business days. Thank you!

## 2017-03-06 NOTE — Telephone Encounter (Signed)
1. Form for handicap parking placard needs office address,etc 2. [facility] Attending Physician's Statement-Initial Report needs: -CPT code for LOOP recorder placement  -phone numbers for Dr. Allena KatzPatel and Dr. Marjory LiesPenumalli  Returned to FMLA/Disability desk

## 2017-03-09 ENCOUNTER — Encounter: Payer: BLUE CROSS/BLUE SHIELD | Admitting: Speech Pathology

## 2017-03-09 NOTE — Telephone Encounter (Signed)
Paperwork scanned and faxed on 03/09/17

## 2017-03-10 NOTE — Telephone Encounter (Signed)
Yes received.

## 2017-03-11 ENCOUNTER — Inpatient Hospital Stay: Payer: BLUE CROSS/BLUE SHIELD | Admitting: Physical Medicine & Rehabilitation

## 2017-03-13 ENCOUNTER — Encounter
Payer: BLUE CROSS/BLUE SHIELD | Attending: Physical Medicine & Rehabilitation | Admitting: Physical Medicine & Rehabilitation

## 2017-03-13 ENCOUNTER — Encounter: Payer: Self-pay | Admitting: Physical Medicine & Rehabilitation

## 2017-03-13 VITALS — BP 150/84 | HR 86

## 2017-03-13 DIAGNOSIS — G8112 Spastic hemiplegia affecting left dominant side: Secondary | ICD-10-CM | POA: Insufficient documentation

## 2017-03-13 DIAGNOSIS — I6990 Unspecified sequelae of unspecified cerebrovascular disease: Secondary | ICD-10-CM | POA: Diagnosis not present

## 2017-03-13 DIAGNOSIS — I10 Essential (primary) hypertension: Secondary | ICD-10-CM

## 2017-03-13 DIAGNOSIS — Z8673 Personal history of transient ischemic attack (TIA), and cerebral infarction without residual deficits: Secondary | ICD-10-CM | POA: Diagnosis not present

## 2017-03-13 DIAGNOSIS — I69359 Hemiplegia and hemiparesis following cerebral infarction affecting unspecified side: Secondary | ICD-10-CM

## 2017-03-13 DIAGNOSIS — R911 Solitary pulmonary nodule: Secondary | ICD-10-CM | POA: Diagnosis not present

## 2017-03-13 DIAGNOSIS — E669 Obesity, unspecified: Secondary | ICD-10-CM | POA: Insufficient documentation

## 2017-03-13 NOTE — Progress Notes (Signed)
Subjective:    Patient ID: [name], male    DOB: 17-Dec-1965, 52 y.o.   MRN: [id number]  HPI  52 y.o. male with history of HTN presents for hospital follow up after receiving CIR for right insular, parietal and cerebellar infarcts.     Admit date: 02/17/2017 Discharge date: 02/26/2017  At discharge, he was instructed to follow up for CT chest, which he states is scheduled for next week. He has an appointment with Neurology and PCP.  He notes improvement in diet. BP is slightly elevated, he is to follow up with his PCP. Denies falls.   Therapies: Started Monday, 2/week DME: Did not require Mobility: Not required.  Pain Inventory Average Pain 0 Pain Right Now 0 My pain is na  In the last 24 hours, has pain interfered with the following? General activity 0 Relation with others 0 Enjoyment of life 0 What TIME of day is your pain at its worst? na Sleep (in general) Good  Pain is worse with: na Pain improves with: na Relief from Meds: 0  Mobility walk without assistance ability to climb steps?  yes do you drive?  no  Function employed # of hrs/week 40  Neuro/Psych No problems in this area  Prior Studies Any changes since last visit?  no  Physicians involved in your care Any changes since last visit?  no   Family History  Problem Relation Age of Onset  . Hypertension Mother   . Hypertension Father   . Stroke Father   . Hypertension Sister   . Leukemia Paternal Uncle    Social History   Socioeconomic History  . Marital status: Married    Spouse name: Vickie  . Number of children: 0  . Years of education: College  . Highest education level: None  Social Needs  . Financial resource strain: None  . Food insecurity - worry: None  . Food insecurity - inability: None  . Transportation needs - medical: None  . Transportation needs - non-medical: None  Occupational History  . Occupation: outside Airline pilotsales    Comment: [employer]  Tobacco Use  .  Smoking status: Never Smoker  . Smokeless tobacco: Never Used  Substance and Sexual Activity  . Alcohol use: Yes    Alcohol/week: 0.0 oz    Comment: rarely, 1-2 times/year  . Drug use: No  . Sexual activity: Yes    Partners: Female  Other Topics Concern  . None  Social History Narrative   Lives with his wife.   Past Surgical History:  Procedure Laterality Date  . LOOP RECORDER INSERTION N/A 02/16/2017   Procedure: LOOP RECORDER INSERTION;  Surgeon: Regan Lemmingamnitz, Will Martin, MD;  [facility] INVAS[facility] CV LAB;  Service: Cardiovascular;  Laterality: N/A;  . NO PAST SURGERIES    . TEE WITHOUT CARDIOVERSION N/A 02/16/2017   Procedure: TRANSESOPHAGEAL ECHOCARDIOGRAM (TEE);  Surgeon: Jake BatheSkains, Mark C, MD;  [facility];  Service: Cardiovascular;  Laterality: N/A;   Past Medical History:  Diagnosis Date  . Environmental allergies   . Fall from roof    with kidney trauma  . Hypertension 2018  . Stroke (HCC)    BP (!) 150/84   Pulse 86   SpO2 98%   Opioid Risk Score:   Fall Risk Score:  `1  Depression screen PHQ 2/9  Depression screen [facility]/9 03/13/2017 02/27/2017 11/26/2016 11/07/2016 10/18/2016 10/03/2014  Decreased Interest 0 0 0 0 0 0  Down, Depressed, Hopeless 0 0 0  0 0 0  PHQ - 2 Score 0 0 0 0 0 0    Review of Systems  HENT: Negative.   Eyes: Negative.   Respiratory: Negative.   Cardiovascular: Negative.   Gastrointestinal: Negative.   Endocrine: Negative.   Genitourinary: Negative.   Musculoskeletal: Negative.   Skin: Negative.   Allergic/Immunologic: Negative.   Neurological: Positive for speech difficulty and weakness. Negative for numbness.  Hematological: Negative.   Psychiatric/Behavioral: Negative.   All other systems reviewed and are negative.     Objective:   Physical Exam Constitutional: He appears well-developed and Obese.  HENT: Normocephalic and atraumatic.  Eyes: EOMI. No discharge. Cardiovascular: RRR. No JVD. Respiratory: Breath sounds normal.  Unlabored. GI: Bowel sounds are normal. He exhibits no distension.  Neurological: He is alert and oriented.  Mild left facial weakness with mild dysarthria.  Motor:  LUE/LLE: 4+/5 proximal to distal (LLE>LUE) Skin: Skin is warm and dry.  Psychiatric: He has a normal mood and affect. His behavior is normal. Thought content normal.     Assessment & Plan:  52 y.o. male with history of HTN presents for hospital follow up after receiving CIR for right insular, parietal and cerebellar infarcts.    1. Left hemiparesis and functional deficits secondary to right insular, parietal and cerebellar infarcts   Begin therapies next week  Follow up with Neurology  2. HTN:    Cont meds  Pt to follow up with PCP for further adjustments  3. ?RUL nodule:   Scheduled for CT next week  4. Obesity  Cont exercise and diet modification  Meds reviewed Referrals reviewed All questions answered

## 2017-03-16 ENCOUNTER — Ambulatory Visit: Payer: BLUE CROSS/BLUE SHIELD | Attending: Physical Medicine & Rehabilitation | Admitting: Occupational Therapy

## 2017-03-16 ENCOUNTER — Ambulatory Visit: Payer: BLUE CROSS/BLUE SHIELD | Admitting: Physical Therapy

## 2017-03-16 ENCOUNTER — Encounter: Payer: Self-pay | Admitting: Occupational Therapy

## 2017-03-16 ENCOUNTER — Ambulatory Visit: Payer: BLUE CROSS/BLUE SHIELD | Admitting: Speech Pathology

## 2017-03-16 ENCOUNTER — Encounter: Payer: Self-pay | Admitting: Physical Therapy

## 2017-03-16 VITALS — BP 128/89

## 2017-03-16 DIAGNOSIS — M6281 Muscle weakness (generalized): Secondary | ICD-10-CM

## 2017-03-16 DIAGNOSIS — R2689 Other abnormalities of gait and mobility: Secondary | ICD-10-CM

## 2017-03-16 DIAGNOSIS — I69954 Hemiplegia and hemiparesis following unspecified cerebrovascular disease affecting left non-dominant side: Secondary | ICD-10-CM

## 2017-03-16 DIAGNOSIS — R41841 Cognitive communication deficit: Secondary | ICD-10-CM | POA: Insufficient documentation

## 2017-03-16 DIAGNOSIS — I69318 Other symptoms and signs involving cognitive functions following cerebral infarction: Secondary | ICD-10-CM | POA: Diagnosis present

## 2017-03-16 DIAGNOSIS — R278 Other lack of coordination: Secondary | ICD-10-CM | POA: Diagnosis present

## 2017-03-16 DIAGNOSIS — R208 Other disturbances of skin sensation: Secondary | ICD-10-CM

## 2017-03-16 NOTE — Therapy (Signed)
[facility] [address] [place], [place], [address] Phone: [phone]   Fax:  [phone]  Physical Therapy Treatment  Patient Details  Name: [name] MRN: [id number] Date of Birth: November 16, 1965 Referring Provider: Dr. Allena Katz   Encounter Date: 03/16/2017  PT End of Session - 03/16/17 1432    Visit Number  2    Number of Visits  9    Date for PT Re-Evaluation  04/04/17    Authorization Type  BCBS-90 visits combined (all 3 disciplines)     PT Start Time  1400    Activity Tolerance  Patient tolerated treatment well    Behavior During Therapy  [facility] for tasks assessed/performed       Past Medical History:  Diagnosis Date  . Environmental allergies   . Fall from roof    with kidney trauma  . Hypertension 2018  . Stroke [facility])     Past Surgical History:  Procedure Laterality Date  . LOOP RECORDER INSERTION N/A 02/16/2017   Procedure: LOOP RECORDER INSERTION;  Surgeon: Regan Lemming, MD;  [facility] LAB;  Service: Cardiovascular;  Laterality: N/A;  . NO PAST SURGERIES    . TEE WITHOUT CARDIOVERSION N/A 02/16/2017   Procedure: TRANSESOPHAGEAL ECHOCARDIOGRAM (TEE);  Surgeon: Jake Bathe, MD;  Location: [facility] ENDOSCOPY;  Service: Cardiovascular;  Laterality: N/A;    Vitals:   03/16/17 1407  BP: 126/90    Subjective Assessment - 03/16/17 1402    Subjective  reports a little soreness in LLE; "I walked a lot this weekend."  went to basketball and hockey game.    Patient Stated Goals  Walk better and improve balance.     Currently in Pain?  No/denies                      [facility] Adult PT Treatment/Exercise - 03/16/17 1403      Exercises   Exercises  Knee/Hip      Knee/Hip Exercises: Aerobic   Stepper  SciFit L2.5 x 8 min; monitored BP following      Knee/Hip Exercises: Standing   Heel Raises  Both;15 reps    Heel Raises Limitations  toe raises x 15    Hip Flexion  Both;15  reps;Knee bent    Hip Flexion Limitations  alternating    Hip Abduction  Both;15 reps;Knee straight    Abduction Limitations  alternating    Hip Extension  Both;15 reps    Extension Limitations  alternating; mod cues for proper technique          Balance Exercises - 03/16/17 1422      Balance Exercises: Standing   Standing Eyes Opened  Foam/compliant surface;Narrow base of support (BOS);Head turns    Standing Eyes Closed  Foam/compliant surface;Narrow base of support (BOS);3 reps;10 secs;Wide (BOA);Head turns        PT Education - 03/16/17 1432    Education provided  Yes    Education Details  HEP    Person(s) Educated  Patient    Methods  Explanation;Demonstration;Handout    Comprehension  Verbalized understanding;Returned demonstration;Need further instruction       PT Short Term Goals - 03/05/17 1206      PT SHORT TERM GOAL #1   Title  same as LTGs        PT Long Term Goals - 03/05/17 1206      PT LONG TERM GOAL #1   Title  Pt will  be IND in HEP to improve deficits listed above. TARGET DATE FOR ALL LTGS: 04/02/17    Status  New      PT LONG TERM GOAL #2   Title  Pt will improve FGA score to >/=28/30 to decr. falls risk.     Status  New      PT LONG TERM GOAL #3   Title  Pt will amb. 1000', IND, over even/uneven terrain to improve functional mobility.     Status  New      PT LONG TERM GOAL #4   Title  Pt will verbalize understanding of CVA risk factors and s/s to prevent additional CVA.     Status  New      PT LONG TERM GOAL #5   Title  Pt will report he is joining a fitness center upon d/c from PT in order to maintain gains made during PT.     Status  New            Plan - 03/16/17 1432    Clinical Impression Statement  Initiated HEP today for balance and strengthening needing mod cues for technique.  Pt reports he will be getting recumbent bike at home, so used SciFit today monitoring BP following.  Will continue to benefit from PT to maximize  function.    PT Treatment/Interventions  ADLs/Self Care Home Management;Biofeedback;Canalith Repostioning;Therapeutic activities;Therapeutic exercise;Functional mobility training;Stair training;Gait training;DME Instruction;Neuromuscular re-education;Balance training;Patient/family education;Orthotic Fit/Training;Vestibular;Manual techniques    PT Next Visit Plan  Review HEP; continue balance and strengthening; Provide CVA ed.     Consulted and Agree with Plan of Care  Patient       Patient will benefit from skilled therapeutic intervention in order to improve the following deficits and impairments:  Abnormal gait, Decreased endurance, Impaired sensation, Obesity, Decreased knowledge of use of DME, Decreased balance, Decreased mobility, Decreased range of motion, Decreased coordination, Impaired flexibility  Visit Diagnosis: Muscle weakness (generalized)  Other disturbances of skin sensation  Other symptoms and signs involving cognitive functions following cerebral infarction  Other abnormalities of gait and mobility  Hemiplegia and hemiparesis following unspecified cerebrovascular disease affecting left non-dominant side [facility](HCC)     Problem List Patient Active Problem List   Diagnosis Date Noted  . Hyperglycemia   . Poor nutrition   . Gastroesophageal reflux disease   . Hyperlipidemia   . Morbid obesity (HCC)   . 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 Cryptogenic stroke (HCC) 02/12/2017  . Allergic rhinitis 11/07/2016  . BMI 39.0-39.9,adult 11/07/2016  . Essential hypertension 10/18/2016      Clarita CraneStephanie F Zuri Lascala, PT, DPT 03/16/17 2:36 PM    [facility] [address] [place], [place], [address] Phone: [phone]   Fax:  [phone]  Name: [name] MRN: [id number] Date of Birth: 1965-10-19

## 2017-03-16 NOTE — Therapy (Signed)
[facility] [address] [place], [place], [address] Phone: [phone]   Fax:  [phone]  Occupational Therapy Treatment  Patient Details  Name: [name] MRN: [id number] Date of Birth: 10-20-1965 Referring Provider: Dr. Allena Katz   Encounter Date: 03/16/2017  OT End of Session - 03/16/17 1631    Visit Number  2    Number of Visits  17    Date for OT Re-Evaluation  05/02/17    Authorization Type  BC/BS    Authorization Time Period  90 combined    OT Start Time  1446    OT Stop Time  1529    OT Time Calculation (min)  43 min    Activity Tolerance  Patient tolerated treatment well       Past Medical History:  Diagnosis Date  . Environmental allergies   . Fall from roof    with kidney trauma  . Hypertension 2018  . Stroke [facility])     Past Surgical History:  Procedure Laterality Date  . LOOP RECORDER INSERTION N/A 02/16/2017   Procedure: LOOP RECORDER INSERTION;  Surgeon: Regan Lemming, MD;  [facility] INVASIVE CV LAB;  Service: Cardiovascular;  Laterality: N/A;  . NO PAST SURGERIES    . TEE WITHOUT CARDIOVERSION N/A 02/16/2017   Procedure: TRANSESOPHAGEAL ECHOCARDIOGRAM (TEE);  Surgeon: Jake Bathe, MD;  Location: [facility] ENDOSCOPY;  Service: Cardiovascular;  Laterality: N/A;    There were no vitals filed for this visit.  Subjective Assessment - 03/16/17 1445    Subjective   I like these goals    Pertinent History  CVA 02/12/17, HTN     Limitations  **Loop recorder, no driving, no strenuous activtity    Patient Stated Goals  Get back to work, get back to driving    Currently in Pain?  Yes    Pain Score  3     Pain Location  Leg    Pain Orientation  Left    Pain Descriptors / Indicators  Sore    Pain Type  Acute pain    Pain Onset  Yesterday    Aggravating Factors   feels like muscle soreness from doing so many stairs on the weekend.    Pain Relieving Factors  nothing its already not as bad today.                     OT Treatments/Exercises (OP) - 03/16/17 0001      ADLs   ADL Comments  Reviewed OT goals and provided pt with written copy.  Pt in agreement with all goals.      Exercises   Exercises  Hand      Hand Exercises   Hand Gripper with Small Beads  Gripper on #2 with 1 inch cubes. Pt with moderate difficulty and required frequent rest breaks. Pt also needs cues to use vision to compensate for sensory loss.     Other Hand Exercises  Instructed pt in HEP for coordinaton and grip/pinch strength (green putty). Pt able to return demonstrate all activites after instruction, demonstration and practice.               OT Education - 03/16/17 1629    Education provided  Yes    Education Details  HEP for grip/pinch strength and coordination LUE    Person(s) Educated  Patient    Methods  Explanation;Demonstration;Verbal cues;Handout    Comprehension  Verbalized understanding;Returned demonstration  OT Short Term Goals - 03/16/17 1630      OT SHORT TERM GOAL #1   Title  Independent with coordination and putty HEP     Time  4    Period  Weeks    Status  On-going      OT SHORT TERM GOAL #2   Title  Pt to verbalize safety considerations with sensory loss Lt side    Time  4    Period  Weeks    Status  On-going      OT SHORT TERM GOAL #3   Title  Pt to tie shoes mod I level with extra time as needed    Time  4    Period  Weeks    Status  On-going      OT SHORT TERM GOAL #4   Title  Pt to divide attention between environmental scanning and physical or cognitive task with 90% accuracy    Time  4    Period  Weeks    Status  On-going      OT SHORT TERM GOAL #5   Title  Pt to improve grip strength Lt hand to 52 lbs average or higher    Baseline  47 lbs    Time  4    Period  Weeks    Status  On-going        OT Long Term Goals - 03/16/17 1630      OT LONG TERM GOAL #1   Title  Pt to be independent with theraband HEP for LUE    Time  8     Period  Weeks    Status  On-going      OT LONG TERM GOAL #2   Title  Pt to improve coordination Lt hand as evidenced by reducing speed on 9 hole peg test to 40 sec. or less    Baseline  55.53 sec.     Time  8    Period  Weeks    Status  On-going      OT LONG TERM GOAL #3   Title  Pt to return to cooking full meal demo safety, organization, planning at mod I level with extra time as needed    Time  8    Period  Weeks    Status  On-going      OT LONG TERM GOAL #4   Title  Pt to demo data entry and other computer tasks necessary to perform work related tasks with 100% accuracy    Time  8    Period  Weeks    Status  On-going            Plan - 03/16/17 1630    Clinical Impression Statement  Pt in agreement with OT goals  Pt progressing toward goals.     Rehab Potential  Good    OT Frequency  2x / week    OT Duration  8 weeks    OT Treatment/Interventions  Self-care/ADL training;DME and/or AE instruction;Therapeutic activities;Therapeutic exercise;Neuromuscular education;Patient/family education;Cognitive remediation/compensation;Fluidtherapy;Functional Mobility Training    Plan  check HEP, coordination, grip strength, UE strength, functional use    Consulted and Agree with Plan of Care  Patient       Patient will benefit from skilled therapeutic intervention in order to improve the following deficits and impairments:  Decreased coordination, Impaired sensation, Impaired UE functional use, Decreased strength, Decreased activity tolerance, Decreased cognition  Visit Diagnosis: Other lack of coordination  Muscle weakness (  generalized)  Other disturbances of skin sensation  Other symptoms and signs involving cognitive functions following cerebral infarction  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 Hyperglycemia   . Poor  nutrition   . Gastroesophageal reflux disease   . Hyperlipidemia   . Morbid obesity (HCC)   . 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 Cryptogenic stroke (HCC) 02/12/2017  . Allergic rhinitis 11/07/2016  . BMI 39.0-39.9,adult 11/07/2016  . Essential hypertension 10/18/2016    [name] Pastel, OTR/L 03/16/2017, 4:33 PM  [facility] [address] [place], [place], [address] Phone: [phone]   Fax:  [phone]  Name: Coy Rochford Li MRN: [id number] Date of Birth: 1966/01/11

## 2017-03-16 NOTE — Therapy (Signed)
[facility] [address] [place], [place], [address] Phone: [phone]   Fax:  [phone]  Speech Language Pathology Treatment  Patient Details  Name: [name] MRN: [id number] Date of Birth: 11/23/1965 Referring Provider: Dr. Maryla Morrow   Encounter Date: 03/16/2017  End of Session - 03/16/17 1641    Visit Number  2    Number of Visits  8    Date for SLP Re-Evaluation  04/10/17    SLP Start Time  1538    SLP Stop Time   1616    SLP Time Calculation (min)  38 min    Activity Tolerance  Patient tolerated treatment well       Past Medical History:  Diagnosis Date  . Environmental allergies   . Fall from roof    with kidney trauma  . Hypertension 2018  . Stroke [facility])     Past Surgical History:  Procedure Laterality Date  . LOOP RECORDER INSERTION N/A 02/16/2017   Procedure: LOOP RECORDER INSERTION;  Surgeon: Regan Lemming, MD;  Location: [facility] INVASIVE CV LAB;  Service: Cardiovascular;  Laterality: N/A;  . NO PAST SURGERIES    . TEE WITHOUT CARDIOVERSION N/A 02/16/2017   Procedure: TRANSESOPHAGEAL ECHOCARDIOGRAM (TEE);  Surgeon: Jake Bathe, MD;  Location[facility] ENDOSCOPY;  Service: Cardiovascular;  Laterality: N/A;    There were no vitals filed for this visit.  Subjective Assessment - 03/16/17 1539    Subjective  "I've been cooking."    Currently in Pain?  Yes    Pain Score  2     Pain Location  Leg    Pain Orientation  Left    Pain Descriptors / Indicators  Sore    Pain Type  Acute pain    Pain Onset  Yesterday    Aggravating Factors   stairs    Effect of Pain on Daily Activities  slows him down            ADULT SLP TREATMENT - 03/16/17 1538      General Information   Behavior/Cognition  Alert;Cooperative;Pleasant mood    Patient Positioning  Upright in chair      Treatment Provided   Treatment provided  Cognitive-Linquistic      Cognitive-Linquistic Treatment   Treatment  focused on  Cognition    Skilled Treatment  SLP reviewed plan of care and proposed therapy goals with pt. Facilitated attention to details in mod complex following written directions task; pt with 90% accuracy; independently checked and corrected single error w/o cues. In mod complex money word problems, pt achieved 60% accuracy; pt aware of 3/4 errors, however required occasional min-mod A question cues to correct. Educated pt re: need to habituate reviewing work for errors.      Assessment / Recommendations / Plan   Plan  Continue with current plan of care      Progression Toward Goals   Progression toward goals  Progressing toward goals       SLP Education - 03/16/17 1640    Education provided  Yes    Education Details  make a habit of checking work for errors    [employer]) Educated  Patient    Methods  Explanation;Verbal cues    Comprehension  Verbalized understanding;Need further instruction         SLP Long Term Goals - 03/16/17 1643      SLP LONG TERM GOAL #1   Title  Pt will complete moderately complex time/money,  reasoning/problem solving tasks with 90% accuracy    Time  4    Period  Weeks    Status  On-going      SLP LONG TERM GOAL #2   Title  Pt will identify and correct errors in cognitive linguistic tasks in 80% of opportunities.     Time  4    Period  Weeks    Status  On-going      SLP LONG TERM GOAL #3   Title  Pt will demo attention to detail necessary for 100% success in detailed linguistic tasks given self correction, over three sessions    Time  4    Period  Weeks    Status  On-going       Plan - 03/16/17 1641    Clinical Impression Statement  [name] presents today with higher level cognitive deficits in reasoning, problem solving, organization and attention to detail. Pt is intermittently reviewing his work for errors, however requires cues to do so systematically. I recommend skilled ST to target the above impairments in order to maxmize cognitive  function for return to work.    Speech Therapy Frequency  2x / week    Duration  4 weeks    Treatment/Interventions  Compensatory strategies;Patient/family education;SLP instruction and feedback;Functional tasks;Internal/external aids;Compensatory techniques    Potential to Achieve Goals  Good    Consulted and Agree with Plan of Care  Patient       Patient will benefit from skilled therapeutic intervention in order to improve the following deficits and impairments:   Cognitive communication deficit    Problem List Patient Active Problem List   Diagnosis Date Noted  . Hyperglycemia   . Poor nutrition   . Gastroesophageal reflux disease   . Hyperlipidemia   . Morbid obesity (HCC)   . 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 Cryptogenic stroke (HCC) 02/12/2017  . Allergic rhinitis 11/07/2016  . BMI 39.0-39.9,adult 11/07/2016  . Essential hypertension 10/18/2016   Rondel BatonMary Beth Megan Hayduk, MS, CCC-SLP Speech-Language Pathologist  Arlana LindauMary E Lessa Huge 03/16/2017, 4:44 PM  [facility] [address] [place], [place], [address] Phone: [phone]   Fax:  [phone]   Name: [name] MRN: [id number] Date of Birth: 1966-01-16

## 2017-03-16 NOTE — Patient Instructions (Signed)
1. Grip Strengthening (Resistive Putty)  GREEN PUTTY   Squeeze putty using thumb and all fingers. Repeat _20___ times. Do __2__ sessions per day.   2. Roll putty into tube on table and pinch between index finger, middle finger and thumb.  Do 5 times.  Do 2 sessions per day.   Coordination Activities  Perform the following activities for 15-20 minutes 1-2 times per day with left hand(s).   Rotate ball in fingertips (clockwise and counter-clockwise).  Toss ball between hands.  Toss ball in air and catch with the same hand.  Flip cards 1 at a time as fast as you can.  Focus on speed with this one.  Deal cards with your thumb (Hold deck in hand and push card off top with thumb).  Shuffle cards.  Pick up coins and place in container or coin bank.  Focus on speed with this one too.  Start with pennies and move to dimes when pennies get too easy.   Pick up coins and stack. Focus on accuracy not speed for this right now.   Pick up coins one at a time until you get 5-10 in your hand, then move coins from palm to fingertips to stack one at a time.  Focus on accuracy not speed with this for now.   Practice writing and/or typing.  Screw together nuts and bolts, then unfasten. Use smaller screws not big ones.  Make sure your left hand does the turning.    Copyright  VHI. All rights reserved.

## 2017-03-16 NOTE — Patient Instructions (Signed)
Standing Marching   Using a chair if necessary, march in place. Repeat __15__ times. Do __2-3__ sessions per day.   Hip Backward Kick   Using a chair for balance, keep legs shoulder width apart and toes pointed for- ward. Slowly extend one leg back, keeping knee straight. Do not lean forward. Repeat with other leg. Repeat _15___ times. Do __2-3__ sessions per day.   Hip Side Kick   Holding a chair for balance, keep legs shoulder width apart and toes pointed forward. Swing a leg out to side, keeping knee straight. Do not lean. Repeat using other leg. Repeat _15___ times. Do __2-3__ sessions per day.  Ankle Awareness    Using support for balance, stand with feet shoulder width apart. Rise up on toes. Hold _2___ seconds. Relax.  Repeat __15__ times. Do _2-3___ sessions per day.  Then raise the toes, keeping heels on the floor.  Perform 15 reps; 2-3 times per day.

## 2017-03-17 ENCOUNTER — Ambulatory Visit
Admission: RE | Admit: 2017-03-17 | Discharge: 2017-03-17 | Disposition: A | Discharge status: Home / Self Care | Payer: BLUE CROSS/BLUE SHIELD | Source: Ambulatory Visit | Attending: Physician Assistant | Admitting: Physician Assistant

## 2017-03-17 DIAGNOSIS — R9089 Other abnormal findings on diagnostic imaging of central nervous system: Secondary | ICD-10-CM

## 2017-03-17 MED ORDER — IOPAMIDOL (ISOVUE-300) INJECTION 61%
75.0000 mL | Freq: Once | INTRAVENOUS | Status: AC | PRN
  Administered 2017-03-17: 75 mL via INTRAVENOUS

## 2017-03-18 ENCOUNTER — Other Ambulatory Visit: Payer: Self-pay | Admitting: Physician Assistant

## 2017-03-18 ENCOUNTER — Ambulatory Visit (INDEPENDENT_AMBULATORY_CARE_PROVIDER_SITE_OTHER): Payer: BLUE CROSS/BLUE SHIELD | Admitting: *Deleted

## 2017-03-18 DIAGNOSIS — E041 Nontoxic single thyroid nodule: Secondary | ICD-10-CM

## 2017-03-18 DIAGNOSIS — I639 Cerebral infarction, unspecified: Secondary | ICD-10-CM | POA: Diagnosis not present

## 2017-03-18 NOTE — Progress Notes (Signed)
Carelink Summary Report / Loop Recorder 

## 2017-03-18 NOTE — Progress Notes (Signed)
Pt notified via V/M

## 2017-03-19 ENCOUNTER — Ambulatory Visit: Payer: BLUE CROSS/BLUE SHIELD

## 2017-03-19 ENCOUNTER — Ambulatory Visit: Payer: BLUE CROSS/BLUE SHIELD | Admitting: Occupational Therapy

## 2017-03-19 ENCOUNTER — Ambulatory Visit: Payer: BLUE CROSS/BLUE SHIELD | Admitting: Speech Pathology

## 2017-03-19 ENCOUNTER — Encounter: Payer: Self-pay | Admitting: Occupational Therapy

## 2017-03-19 DIAGNOSIS — R278 Other lack of coordination: Secondary | ICD-10-CM

## 2017-03-19 DIAGNOSIS — M6281 Muscle weakness (generalized): Secondary | ICD-10-CM

## 2017-03-19 DIAGNOSIS — R41841 Cognitive communication deficit: Secondary | ICD-10-CM

## 2017-03-19 DIAGNOSIS — I69954 Hemiplegia and hemiparesis following unspecified cerebrovascular disease affecting left non-dominant side: Secondary | ICD-10-CM

## 2017-03-19 DIAGNOSIS — R208 Other disturbances of skin sensation: Secondary | ICD-10-CM

## 2017-03-19 DIAGNOSIS — I69318 Other symptoms and signs involving cognitive functions following cerebral infarction: Secondary | ICD-10-CM

## 2017-03-19 DIAGNOSIS — R2689 Other abnormalities of gait and mobility: Secondary | ICD-10-CM

## 2017-03-19 NOTE — Therapy (Signed)
[facility] [address] [place], [place], [address] Phone: [phone]   Fax:  [phone]  Physical Therapy Treatment  Patient Details  Name: [name] MRN: [id number] Date of Birth: 1966-01-05 Referring Provider: Dr. Allena Katz   Encounter Date: 03/19/2017  PT End of Session - 03/19/17 1622    Visit Number  3    Number of Visits  9    Date for PT Re-Evaluation  04/04/17    Authorization Type  BCBS-90 visits combined (all 3 disciplines)     PT Start Time  1530    PT Stop Time  1615    PT Time Calculation (min)  45 min    Activity Tolerance  Patient tolerated treatment well    Behavior During Therapy  [facility] for tasks assessed/performed       Past Medical History:  Diagnosis Date  . Environmental allergies   . Fall from roof    with kidney trauma  . Hypertension 2018  . Stroke [facility])     Past Surgical History:  Procedure Laterality Date  . LOOP RECORDER INSERTION N/A 02/16/2017   Procedure: LOOP RECORDER INSERTION;  Surgeon: Regan Lemming, MD;  [facility] INVAS[facility] CV LAB;  Service: Cardiovascular;  Laterality: N/A;  . NO PAST SURGERIES    . TEE WITHOUT CARDIOVERSION N/A 02/16/2017   Procedure: TRANSESOPHAGEAL ECHOCARDIOGRAM (TEE);  Surgeon: Jake Bathe, MD;  Location: [facility] ENDOSCOPY;  Service: Cardiovascular;  Laterality: N/A;    There were no vitals filed for this visit.  Subjective Assessment - 03/19/17 1534    Subjective  pt reports he has been doing his HEP at home and reports no falls since. pt also reports that he is slightly tired from a long day of therapy.     Pertinent History  HTN, hx fall from roof with kidney trauma    Patient Stated Goals  Walk better and improve balance.     Currently in Pain?  Yes    Pain Score  2     Pain Location  Ankle    Pain Orientation  Left    Pain Descriptors / Indicators  Sore    Pain Type  Acute pain    Pain Onset  In the past 7 days    Pain Frequency   Intermittent    Aggravating Factors   long walks     Pain Relieving Factors  rest                      [facility] Adult PT Treatment/Exercise - 03/19/17 1635      Transfers   Transfers  Sit to Stand;Stand to Sit    Sit to Stand  7: Independent;From chair/3-in-1;Without upper extremity assist    Stand to Sit  7: Independent;Without upper extremity assist;To chair/3-in-1      Ambulation/Gait   Ambulation/Gait  Yes    Ambulation/Gait Assistance  5: Supervision to ensure safety   Ambulation Distance (Feet)  200 Feet    Assistive device  None    Gait Pattern  Step-through pattern;Decreased stride length;Decreased arm swing - left;Decreased dorsiflexion - left;Decreased trunk rotation    Ambulation Surface  Level;Indoor      High Level Balance   High Level Balance Activities  Braiding;Backward walking;Head turns;Sudden stops;Tandem walking  See balance section below for details. Cues and demo for improved weight shifting and technique.      Exercises   Exercises  Knee/Hip;Ankle  Knee/Hip Exercises: Standing   Heel Raises  Both;15 reps    Heel Raises Limitations  toe raises x 15    Hip Flexion  Both;15 reps;Knee bent    Hip Flexion Limitations  alternating    Forward Lunges  1 set;Both;Other (comment) 10 sec holds, 3 reps  on BOSU in parallel bar    Hip Abduction  Both;15 reps;Knee straight    Abduction Limitations  non- alternating    Hip Extension  Both;15 reps    SLS  3x 30sec R and L leg  with parallel bars intermittent UE use for balance     Therex: see pt instructions for HEP details. Pt required cues and demo for proper technique.      Balance Exercises - 03/19/17 1642      Balance Exercises: Standing   Tandem Stance  Eyes open;Eyes closed;Foam/compliant surface;Intermittent upper extremity support;4 reps;25 secs Head  turns on firm surface,     SLS  Eyes open;Eyes closed;Solid surface;Intermittent upper extremity support;3 reps;30 secs    Standing, One  Foot on a Step  Eyes open;Head turns;8 inch;30 secs;3 reps R and L    Gait with Head Turns  Forward;4 reps Horizontal and vertical     Tandem Gait  Forward;Retro;Intermittent upper extremity support;Foam/compliant surface;4 reps Fwd on foam only, retro on firm surface.     Retro Gait  Upper extremity support;4 reps 10 steps        PT Education - 03/19/17 1621    Education provided  Yes    Education Details  CVA risk factors modifiable  and non-modifiable, HEP review and new HEP exercises    Methods  Explanation;Handout;Verbal cues;Tactile cues;Demonstration    Comprehension  Verbalized understanding;Returned demonstration       PT Short Term Goals - 03/05/17 1206      PT SHORT TERM GOAL #1   Title  same as LTGs        PT Long Term Goals - 03/05/17 1206      PT LONG TERM GOAL #1   Title  Pt will be IND in HEP to improve deficits listed above. TARGET DATE FOR ALL LTGS: 04/02/17    Status  New      PT LONG TERM GOAL #2   Title  Pt will improve FGA score to >/=28/30 to decr. falls risk.     Status  New      PT LONG TERM GOAL #3   Title  Pt will amb. 1000', IND, over even/uneven terrain to improve functional mobility.     Status  New      PT LONG TERM GOAL #4   Title  Pt will verbalize understanding of CVA risk factors and s/s to prevent additional CVA.     Status  New      PT LONG TERM GOAL #5   Title  Pt will report he is joining a fitness center upon d/c from PT in order to maintain gains made during PT.     Status  New            Plan - 03/19/17 1623    Clinical Impression Statement  Pt continues to work hard during therapy session and required minimal verbal cues for HEP review. Pt continues to have difficulty with ankle strength exercise but tolerated increase in intensity of therapeutic exercise and addition to HEP program addressing functional strength and balance. Pt noted to experience incr. Postural sway during SLS, indicating pt would benefit from ankle  strategy balance training. Pt will continue to benefit from skilled PT to maximize function.    Rehab Potential  Good    PT Frequency  2x / week    PT Duration  4 weeks    PT Treatment/Interventions  ADLs/Self Care Home Management;Biofeedback;Canalith Repostioning;Therapeutic activities;Therapeutic exercise;Functional mobility training;Stair training;Gait training;DME Instruction;Neuromuscular re-education;Balance training;Patient/family education;Orthotic Fit/Training;Vestibular;Manual techniques    PT Next Visit Plan  review HEP, continue to progress LE strengthening and balance exercies to progress Pt towards reaching LTGs(2/21)    Consulted and Agree with Plan of Care  Patient       Patient will benefit from skilled therapeutic intervention in order to improve the following deficits and impairments:  Abnormal gait, Decreased endurance, Impaired sensation, Obesity, Decreased knowledge of use of DME, Decreased balance, Decreased mobility, Decreased range of motion, Decreased coordination, Impaired flexibility  Visit Diagnosis: Muscle weakness (generalized)  Other symptoms and signs involving cognitive functions following cerebral infarction  Other abnormalities of gait and mobility  Hemiplegia and hemiparesis following unspecified cerebrovascular disease affecting left non-dominant side (HCC)  Other lack of coordination     Problem List Patient Active Problem List   Diagnosis Date Noted  . Hyperglycemia   . Poor nutrition   . Gastroesophageal reflux disease   . Hyperlipidemia   . Morbid obesity (HCC)   . 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 Cryptogenic stroke (HCC) 02/12/2017  . Allergic rhinitis 11/07/2016  . BMI 39.0-39.9,adult 11/07/2016  . Essential hypertension 10/18/2016    Merton BorderSamuel Abdulrahman Bracey SPT 03/19/2017, 4:47 PM  [facility] [address] [place], [place], [address] Phone: [phone]    Fax:  [phone]  Name: [name] MRN: [id number] Date of Birth: [birth year]

## 2017-03-19 NOTE — Patient Instructions (Addendum)
Ischemic Stroke An ischemic stroke is the sudden death of brain tissue. Blood carries oxygen to all areas of the body. This type of stroke happens when your blood does not flow to your brain like normal. Your brain cannot get the oxygen it needs. This is an emergency. It must be treated right away. Symptoms of a stroke usually happen all of a sudden. You may notice them when you wake up. They can include:  Weakness or loss of feeling in your face, arm, or leg. This often happens on one side of the body.  Trouble walking.  Trouble moving your arms or legs.  Loss of balance or coordination.  Feeling confused.  Trouble talking or understanding what people are saying.  Slurred speech.  Trouble seeing.  Seeing two of one object (double vision).  Feeling dizzy.  Feeling sick to your stomach (nauseous) and throwing up (vomiting).  A very bad headache for no reason.  Get help as soon as any of these problems start. This is important. Some treatments work better if they are given right away. These include:  Aspirin.  Medicines to control blood pressure.  A shot (injection) of medicine to break up the blood clot.  Treatments given in the blood vessel (artery) to take out the clot or break it up.  Other treatments may include:  Oxygen.  Fluids given through an IV tube.  Medicines to thin out your blood.  Procedures to help your blood flow better.  What increases the risk? Certain things may make you more likely to have a stroke. Some of these are things that you can change, such as:  Being very overweight (obesity).  Smoking.  Taking birth control pills.  Not being active.  Drinking too much alcohol.  Using drugs.  Other risk factors include:  High blood pressure.  High cholesterol.  Diabetes.  Heart disease.  Being [place] American, Native American, Hispanic, or [place] Native.  Being over age 57.  Family history of stroke.  Having had blood clots,  stroke, or warning stroke (transient ischemic attack, TIA) in the past.  Sickle cell disease.  Being a woman with a history of high blood pressure in pregnancy (preeclampsia).  Migraine headache.  Sleep apnea.  Having an irregular heartbeat (atrial fibrillation).  Long-term (chronic) diseases that cause soreness and swelling (inflammation).  Disorders that affect how your blood clots.  Follow these instructions at home: Medicines  Take over-the-counter and prescription medicines only as told by your doctor.  If you were told to take aspirin or another medicine to thin your blood, take it exactly as told by your doctor. ? Taking too much of the medicine can cause bleeding. ? If you do not take enough, it may not work as well.  Know the side effects of your medicines. If you are taking a blood thinner, make sure you: ? Hold pressure over any cuts for longer than usual. ? Tell your dentist and other doctors that you take this medicine. ? Avoid activities that may cause damage or injury to your body. Eating and drinking  Follow instructions from your doctor about what you cannot eat or drink.  Eat healthy foods.  If you have trouble with swallowing, do these things to avoid choking: ? Take small bites when eating. ? Eat foods that are soft or pureed. Safety  Follow instructions from your health care team about physical activity.  Use a walker or cane as told by your doctor.  Keep your home safe so  you do not fall. This may include: ? Having experts look at your home to make sure it is safe. ? Putting grab bars in the bedroom and bathroom. ? Using raised toilets. ? Putting a seat in the shower. General instructions  Do not use any tobacco products. ? Examples of these are cigarettes, chewing tobacco, and e-cigarettes. ? If you need help quitting, ask your doctor.  Limit how much alcohol you drink. This means no more than 1 drink a day for nonpregnant women and 2  drinks a day for men. One drink equals 12 oz of beer, 5 oz of wine, or 1 oz of hard liquor.  If you need help to stop using drugs or alcohol, ask your doctor to refer you to a program or specialist.  Stay active. Exercise as told by your doctor.  Keep all follow-up visits as told by your doctor. This is important. Get help right away if:  You suddenly: ? Have weakness or loss of feeling in your face, arm, or leg. ? Feel confused. ? Have trouble talking or understanding what people are saying. ? Have trouble seeing. ? Have trouble walking. ? Have trouble moving your arms or legs. ? Feel dizzy. ? Lose your balance or coordination. ? Have a very bad headache and you do not know why.  You pass out (lose consciousness) or almost pass out.  You have jerky movements that you cannot control (seizure). These symptoms may be an emergency. Do not wait to see if the symptoms will go away. Get medical help right away. Call your local emergency services (911 in the [place] Do not drive yourself to the hospital. This information is not intended to replace advice given to you by your health care provider. Make sure you discuss any questions you have with your health care provider. Document Released: 01/16/2011 Document Revised: 07/10/2015 Document Reviewed: 04/25/2015 Elsevier Interactive Patient Education  2018 [employer]   Backward    Walk backwards with eyes open. Take even steps, making sure each foot lifts off floor. Repeat for __10__ steps per session. Do __2__ sessions per day.   Copyright  VHI. All rights reserved.  Feet Heel-Toe "Tandem"    Arms outstretched, walk a straight line bringing one foot directly in front of the other. Repeat for __10__ steps per session. Do _2__ sessions per day.  Copyright  VHI. All rights reserved.

## 2017-03-19 NOTE — Therapy (Signed)
[facility] [address] [place], [place], [address] Phone: [phone]   Fax:  [phone]  Speech Language Pathology Treatment  Patient Details  Name: Jesus IlesChester Tomkiewicz Li MRN: [id number] Date of Birth: 1965-03-28 Referring Provider: Dr. Maryla MorrowAnkit Patel   Encounter Date: 03/19/2017  End of Session - 03/19/17 1702    Visit Number  3    Number of Visits  8    Date for SLP Re-Evaluation  04/10/17    SLP Start Time  1449    SLP Stop Time   1530    SLP Time Calculation (min)  41 min    Activity Tolerance  Patient tolerated treatment well       Past Medical History:  Diagnosis Date  . Environmental allergies   . Fall from roof    with kidney trauma  . Hypertension 2018  . Stroke [facility](HCC)     Past Surgical History:  Procedure Laterality Date  . LOOP RECORDER INSERTION N/A 02/16/2017   Procedure: LOOP RECORDER INSERTION;  Surgeon: Regan Lemmingamnitz, Will Martin, MD;  [facility]: [facility] INVAS[facility] CV LAB;  Service: Cardiovascular;  Laterality: N/A;  . NO PAST SURGERIES    . TEE WITHOUT CARDIOVERSION N/A 02/16/2017   Procedure: TRANSESOPHAGEAL ECHOCARDIOGRAM (TEE);  Surgeon: Jake BatheSkains, Mark C, MD;  [facility];  Service: Cardiovascular;  Laterality: N/A;    There were no vitals filed for this visit.  Subjective Assessment - 03/19/17 1452    Subjective  Pt arrives with his work binder    Currently in Pain?  Yes    Pain Score  2     Pain Location  Finger (Comment which one)    Pain Orientation  Left    Pain Descriptors / Indicators  Sore    Pain Type  Acute pain    Pain Onset  In the past 7 days    Aggravating Factors   picking up small things    Pain Relieving Factors  rest            ADULT SLP TREATMENT - 03/19/17 1453      General Information   Behavior/Cognition  Alert;Cooperative;Pleasant mood    Patient Positioning  Upright in chair      Treatment Provided   Treatment provided  Cognitive-Linquistic      Cognitive-Linquistic Treatment   Treatment focused on  Cognition    Skilled Treatment  Pt showed SLP order reports from his job and explained 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 On home assigments, pt had reviewed and corrected errors in checkbook register calculations using a calculator. 80% accuracy for simple math word problems; ID'd 1/2 errors independently. Mod complex mixed math problems 87% accuracy; pt rechecked work however unable to locate errors without assistance. SLP facilitated functional task calculating shipment dates with use of a calendar; 100% accuracy with 1 self-corrected error.      Assessment / Recommendations / Plan   Plan  Continue with current plan of care      Progression Toward Goals   Progression toward goals  Progressing toward goals       SLP Education - 03/19/17 1701    Education provided  Yes    Education Details  recheck math vs "looking over" for errors    Person(s) Educated  Patient    Methods  Explanation;Verbal cues    Comprehension  Verbalized understanding;Need further instruction         SLP  Long Term Goals - 03/19/17 1511      SLP LONG TERM GOAL #1   Title  Pt will complete moderately complex time/money, reasoning/problem solving tasks with 90% accuracy    Time  4    Period  Weeks    Status  On-going      SLP LONG TERM GOAL #2   Title  Pt will identify and correct errors in cognitive linguistic tasks in 80% of opportunities.     Time  4    Period  Weeks    Status  On-going      SLP LONG TERM GOAL #3   Title  Pt will demo attention to detail necessary for 100% success in detailed linguistic tasks given self correction, over three sessions    Time  4    Period  Weeks    Status  On-going       Plan - 03/19/17 1702    Clinical Impression Statement  [name] presents today with higher level cognitive deficits in reasoning, problem solving,  organization and attention to detail. Pt is intermittently reviewing his work for errors, however requires cues to do so systematically. I recommend skilled ST to target the above impairments in order to maxmize cognitive function for return to work.    Speech Therapy Frequency  2x / week    Duration  4 weeks    Treatment/Interventions  Compensatory strategies;Patient/family education;SLP instruction and feedback;Functional tasks;Internal/external aids;Compensatory techniques;Cognitive reorganization    Potential to Achieve Goals  Good    Consulted and Agree with Plan of Care  Patient       Patient will benefit from skilled therapeutic intervention in order to improve the following deficits and impairments:   Cognitive communication deficit    Problem List Patient Active Problem List   Diagnosis Date Noted  . Hyperglycemia   . Poor nutrition   . Gastroesophageal reflux disease   . Hyperlipidemia   . Morbid obesity (HCC)   . 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 Cryptogenic stroke (HCC) 02/12/2017  . Allergic rhinitis 11/07/2016  . BMI 39.0-39.9,adult 11/07/2016  . Essential hypertension 10/18/2016   Rondel Baton, MS, CCC-SLP Speech-Language Pathologist  [name] 03/19/2017, 5:04 PM  [facility] [address] [place], [place], [address] Phone: [phone]   Fax:  [phone]   Name: [name] MRN: [id number] Date of Birth: Aug 16, 1965

## 2017-03-19 NOTE — Therapy (Signed)
[facility] [address] [place], [place], [address] Phone: [phone]   Fax:  [phone]  Occupational Therapy Treatment  Patient Details  Name: [name] MRN: [id number] Date of Birth: 1966/01/25 Referring Provider: Dr. Allena Katz   Encounter Date: 03/19/2017  OT End of Session - 03/19/17 1442    Visit Number  3    Number of Visits  17    Date for OT Re-Evaluation  05/02/17    Authorization Type  BC/BS    Authorization Time Period  90 combined    Authorization - Number of Visits  1401    OT Start Time  1443    Activity Tolerance  Patient tolerated treatment well       Past Medical History:  Diagnosis Date  . Environmental allergies   . Fall from roof    with kidney trauma  . Hypertension 2018  . Stroke [facility])     Past Surgical History:  Procedure Laterality Date  . LOOP RECORDER INSERTION N/A 02/16/2017   Procedure: LOOP RECORDER INSERTION;  Surgeon: Regan Lemming, MD;  Location: [facility] INVASIVE CV LAB;  Service: Cardiovascular;  Laterality: N/A;  . NO PAST SURGERIES    . TEE WITHOUT CARDIOVERSION N/A 02/16/2017   Procedure: TRANSESOPHAGEAL ECHOCARDIOGRAM (TEE);  Surgeon: Jake Bathe, MD;  Location: [facility] ENDOSCOPY;  Service: Cardiovascular;  Laterality: N/A;    There were no vitals filed for this visit.  Subjective Assessment - 03/19/17 1409    Pertinent History  CVA 02/12/17, HTN     Limitations  **Loop recorder, no driving, no strenuous activtity    Patient Stated Goals  Get back to work, get back to driving    Currently in Pain?  Yes    Pain Score  2  its a bit painful and swollen    Pain Location  Ankle    Pain Orientation  Left    Pain Descriptors / Indicators  Sore    Pain Type  Acute pain    Pain Onset  In the past 7 days 2 days ago    Pain Frequency  Intermittent    Aggravating Factors   walking too long it.     Pain Relieving Factors  rest, pt has not tried ice.                     OT Treatments/Exercises (OP) - 03/19/17 0001      Hand Exercises   Other Hand Exercises  Locating small pegs in blue putty and then placing in small peg board to address L pinch/hand strength as well as coodination. Pt able to complete with increased time.  Grooved peg board to address in hand manipulation - pt requires min cues to manipulate peg in hand vs substituting arm movements, as well as to use vision to compensate for sensory impairment. Pt with moderate dropping and difficulty - required increased time.  Completed Randell Patient Dexterity without the tweezers. Pt with moderate dropping with this task. Pt improved with this task with moderate cueing and demonstration to look at hand when doing task.  Pt will need reinforcement/practice for this strategy. Pt reported fatigue of 4/10 in hand at end of session.                   OT Short Term Goals - 03/19/17 1440      OT SHORT TERM GOAL #1   Title  Independent with coordination and putty HEP  Time  4    Period  Weeks    Status  Achieved      OT SHORT TERM GOAL #2   Title  Pt to verbalize safety considerations with sensory loss Lt side    Time  4    Period  Weeks    Status  On-going      OT SHORT TERM GOAL #3   Title  Pt to tie shoes mod I level with extra time as needed    Time  4    Period  Weeks    Status  On-going      OT SHORT TERM GOAL #4   Title  Pt to divide attention between environmental scanning and physical or cognitive task with 90% accuracy    Time  4    Period  Weeks    Status  On-going      OT SHORT TERM GOAL #5   Title  Pt to improve grip strength Lt hand to 52 lbs average or higher    Baseline  47 lbs    Time  4    Period  Weeks    Status  On-going        OT Long Term Goals - 03/19/17 1440      OT LONG TERM GOAL #1   Title  Pt to be independent with theraband HEP for LUE    Time  8    Period  Weeks    Status  On-going      OT LONG TERM GOAL #2    Title  Pt to improve coordination Lt hand as evidenced by reducing speed on 9 hole peg test to 40 sec. or less    Baseline  55.53 sec.     Time  8    Period  Weeks    Status  On-going      OT LONG TERM GOAL #3   Title  Pt to return to cooking full meal demo safety, organization, planning at mod I level with extra time as needed    Time  8    Period  Weeks    Status  On-going      OT LONG TERM GOAL #4   Title  Pt to demo data entry and other computer tasks necessary to perform work related tasks with 100% accuracy    Time  8    Period  Weeks    Status  On-going            Plan - 03/19/17 1441    Clinical Impression Statement  Pt progressing toward goals. Pt reports that he is doing his HEP at home and it is going well.     OT Frequency  2x / week    OT Duration  8 weeks    OT Treatment/Interventions  Self-care/ADL training;DME and/or AE instruction;Therapeutic activities;Therapeutic exercise;Neuromuscular education;Patient/family education;Cognitive remediation/compensation;Fluidtherapy;Functional Mobility Training    Plan  coordination, grip strength, UE strength, functional use, cooking task for cognitive skills    Consulted and Agree with Plan of Care  Patient       Patient will benefit from skilled therapeutic intervention in order to improve the following deficits and impairments:  Decreased coordination, Impaired sensation, Impaired UE functional use, Decreased strength, Decreased activity tolerance, Decreased cognition  Visit Diagnosis: Muscle weakness (generalized)  Other disturbances of skin sensation  Other symptoms and signs involving cognitive functions following cerebral infarction  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 Hyperglycemia   . Poor nutrition   . Gastroesophageal reflux  disease   . Hyperlipidemia   . Morbid obesity (HCC)   . 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 Cryptogenic stroke (HCC) 02/12/2017  . Allergic rhinitis 11/07/2016  . BMI 39.0-39.9,adult 11/07/2016  . Essential hypertension 10/18/2016    [name] Pastel, OTR/L 03/19/2017, 2:46 PM  [facility] [address] [place], [place], [address] Phone: [phone]   Fax:  [phone]  Name: [name] MRN: [id number] Date of Birth: 01/10/1966

## 2017-03-25 ENCOUNTER — Encounter: Payer: Self-pay | Admitting: Physical Therapy

## 2017-03-25 ENCOUNTER — Ambulatory Visit: Payer: BLUE CROSS/BLUE SHIELD | Admitting: Physical Therapy

## 2017-03-25 ENCOUNTER — Ambulatory Visit: Payer: BLUE CROSS/BLUE SHIELD | Admitting: Occupational Therapy

## 2017-03-25 ENCOUNTER — Ambulatory Visit: Payer: BLUE CROSS/BLUE SHIELD

## 2017-03-25 DIAGNOSIS — M6281 Muscle weakness (generalized): Secondary | ICD-10-CM

## 2017-03-25 DIAGNOSIS — I69318 Other symptoms and signs involving cognitive functions following cerebral infarction: Secondary | ICD-10-CM

## 2017-03-25 DIAGNOSIS — R278 Other lack of coordination: Secondary | ICD-10-CM

## 2017-03-25 DIAGNOSIS — I69954 Hemiplegia and hemiparesis following unspecified cerebrovascular disease affecting left non-dominant side: Secondary | ICD-10-CM

## 2017-03-25 DIAGNOSIS — R208 Other disturbances of skin sensation: Secondary | ICD-10-CM

## 2017-03-25 DIAGNOSIS — R41841 Cognitive communication deficit: Secondary | ICD-10-CM

## 2017-03-25 DIAGNOSIS — R2689 Other abnormalities of gait and mobility: Secondary | ICD-10-CM

## 2017-03-25 NOTE — Therapy (Signed)
[facility] [address] [place], [place], [address] Phone: [phone]   Fax:  [phone]  Speech Language Pathology Treatment  Patient Details  Name: [name] MRN: [id number] Date of Birth: 1966/01/28 Referring Provider: Dr. Maryla MorrowAnkit Patel   Encounter Date: 03/25/2017  End of Session - 03/25/17 1606    Visit Number  4    Number of Visits  8    Date for SLP Re-Evaluation  04/10/17    SLP Start Time  1450    SLP Stop Time   1530    SLP Time Calculation (min)  40 min       Past Medical History:  Diagnosis Date  . Environmental allergies   . Fall from roof    with kidney trauma  . Hypertension 2018  . Stroke [facility](HCC)     Past Surgical History:  Procedure Laterality Date  . LOOP RECORDER INSERTION N/A 02/16/2017   Procedure: LOOP RECORDER INSERTION;  Surgeon: Regan Lemmingamnitz, Will Martin, MD;  Location: [facility] INVASIVE CV LAB;  Service: Cardiovascular;  Laterality: N/A;  . NO PAST SURGERIES    . TEE WITHOUT CARDIOVERSION N/A 02/16/2017   Procedure: TRANSESOPHAGEAL ECHOCARDIOGRAM (TEE);  Surgeon: Jake BatheSkains, Mark C, MD;  [facility];  Service: Cardiovascular;  Laterality: N/A;    There were no vitals filed for this visit.  Subjective Assessment - 03/25/17 1459    Subjective  Pt arrives again with his work binder.    Currently in Pain?  No/denies            ADULT SLP TREATMENT - 03/25/17 1459      General Information   Behavior/Cognition  Alert;Cooperative;Pleasant mood      Treatment Provided   Treatment provided  Cognitive-Linquistic      Cognitive-Linquistic Treatment   Treatment focused on  Cognition    Skilled Treatment  SLP asked pt about his job responsibilities and pt provided overly verbose response, indiciating decr'd skills in emergent awareness. SLP targeted pt's reasoning and attention to detail. Pt worked for 22 minutes on class schedule worksheet, requiring SLP min-mod A. 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 Pt stated he would have taken no shorter a time prior to his CVA.       Assessment / Recommendations / Plan   Plan  Continue with current plan of care      Progression Toward Goals   Progression toward goals  Progressing toward goals           SLP Long Term Goals - 03/25/17 1608      SLP LONG TERM GOAL #1   Title  Pt will complete moderately complex time/money, reasoning/problem solving tasks with 90% accuracy    Time  3    Period  Weeks    Status  On-going      SLP LONG TERM GOAL #2   Title  Pt will identify and correct errors in cognitive linguistic tasks in 80% of opportunities.     Time  3    Period  Weeks    Status  On-going      SLP LONG TERM GOAL #3   Title  Pt will demo attention to detail necessary for 100% success in detailed linguistic tasks given self correction, over three sessions    Time  3    Period  Weeks    Status  On-going  Plan - 03/25/17 1607    Clinical Impression Statement  [name] presents today with cont'd higher level cognitive deficits in reasoning, problem solving, organization, awareness, and attention to detail. I recommend cont'd skilled ST to target the above impairments in order to maxmize cognitive function for return to work.    Speech Therapy Frequency  2x / week    Duration  4 weeks    Treatment/Interventions  Compensatory strategies;Patient/family education;SLP instruction and feedback;Functional tasks;Internal/external aids;Compensatory techniques;Cognitive reorganization    Potential to Achieve Goals  Good    Consulted and Agree with Plan of Care  Patient       Patient will benefit from skilled therapeutic intervention in order to improve the following deficits and impairments:   Cognitive communication deficit    Problem List Patient Active Problem List   Diagnosis Date Noted  .  Hyperglycemia   . Poor nutrition   . Gastroesophageal reflux disease   . Hyperlipidemia   . Morbid obesity (HCC)   . 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 Cryptogenic stroke (HCC) 02/12/2017  . Allergic rhinitis 11/07/2016  . BMI 39.0-39.9,adult 11/07/2016  . Essential hypertension 10/18/2016    [facility] ,MS, CCC-SLP  03/25/2017, 4:08 PM  [facility] [address] [place], [place], [address] Phone: [phone]   Fax:  [phone]   Name: [name] MRN: [id number] Date of Birth: 06/12/1965

## 2017-03-25 NOTE — Patient Instructions (Signed)
  Please complete the assigned speech therapy homework prior to your next session and return it to the speech therapist at your next visit.  

## 2017-03-25 NOTE — Therapy (Signed)
[facility] [address] [place], [place], [address] Phone: [phone]   Fax:  [phone]  Occupational Therapy Treatment  Patient Details  Name: [name] MRN: [id number] Date of Birth: May 27, 1965 Referring Provider: Dr. Posey Pronto   Encounter Date: 03/25/2017  OT End of Session - 03/25/17 1357    Visit Number  4    Number of Visits  17    Date for OT Re-Evaluation  05/02/17    Authorization Type  BC/BS    Authorization Time Period  90 combined    OT Start Time  1315    OT Stop Time  1400    OT Time Calculation (min)  45 min    Activity Tolerance  Patient tolerated treatment well    Behavior During Therapy  [facility] for tasks assessed/performed       Past Medical History:  Diagnosis Date  . Environmental allergies   . Fall from roof    with kidney trauma  . Hypertension 2018  . Stroke [facility])     Past Surgical History:  Procedure Laterality Date  . LOOP RECORDER INSERTION N/A 02/16/2017   Procedure: LOOP RECORDER INSERTION;  Surgeon: Constance Haw, MD;  Location: [place] CV LAB;  Service: Cardiovascular;  Laterality: N/A;  . NO PAST SURGERIES    . TEE WITHOUT CARDIOVERSION N/A 02/16/2017   Procedure: TRANSESOPHAGEAL ECHOCARDIOGRAM (TEE);  Surgeon: Jerline Pain, MD;  Location: [facility] ENDOSCOPY;  Service: Cardiovascular;  Laterality: N/A;    There were no vitals filed for this visit.  Subjective Assessment - 03/25/17 1318    Subjective   I had a shooting pain from my Lt wrist to my fingers yesterday, but it went away quickly and hasn't come back. I'm tying my shoes consistently now. I'm also cooking now    Pertinent History  CVA 02/12/17, HTN     Limitations  **Loop recorder, no driving, no strenuous activtity    Patient Stated Goals  Get back to work, get back to driving    Currently in Pain?  No/denies                   OT Treatments/Exercises (OP) - 03/25/17 0001      ADLs   ADL  Comments  Discussed safety considerations w/ loss of sensation Lt hand and precautions/things to avoid - see pt instructions for details. Also discussed progress towards STG's      Exercises   Exercises  --      Visual/Perceptual Exercises   Scanning  Environmental    Scanning - Environmental  Pt tossing ball Lt hand while performing environmental scanning for dual tasking, scanning, and gross motor coordination. Pt with mod difficulty and min drops tossing ball Lt hand and only 50% accuracy locating items in environment (6/12 items found). 2nd trial: pt given cognitive task to do (subtracting by 7's) while scanning for remaining items - pt found 5/6 items w/ min difficulty and increased difficulty dual tasking.       Fine motor tasks: Pt attempting to place grooved pegs in pegboard using tweezers for pincer control and graded pressure, however max difficulty/drops, therefore placing pegs Lt hand w/o tweezers. Attempted same activity with [place] pegs but w/ max drops using tweezers (suspect inability to grade how much tension to apply on tweezers).        OT Education - 03/25/17 1327    Education provided  Yes    Education Details  Safety considerations  for lack of sensation Lt hand    Person(s) Educated  Patient    Methods  Explanation;Handout    Comprehension  Verbalized understanding       OT Short Term Goals - 03/25/17 1338      OT SHORT TERM GOAL #1   Title  Independent with coordination and putty HEP     Time  4    Period  Weeks    Status  Achieved      OT SHORT TERM GOAL #2   Title  Pt to verbalize safety considerations with sensory loss Lt side    Time  4    Period  Weeks    Status  Achieved      OT SHORT TERM GOAL #3   Title  Pt to tie shoes mod I level with extra time as needed    Time  4    Period  Weeks    Status  Achieved      OT SHORT TERM GOAL #4   Title  Pt to divide attention between environmental scanning and physical or cognitive task with 90%  accuracy    Time  4    Period  Weeks    Status  On-going      OT SHORT TERM GOAL #5   Title  Pt to improve grip strength Lt hand to 52 lbs average or higher    Baseline  47 lbs    Time  4    Period  Weeks    Status  On-going        OT Long Term Goals - 03/19/17 1440      OT LONG TERM GOAL #1   Title  Pt to be independent with theraband HEP for LUE    Time  8    Period  Weeks    Status  On-going      OT LONG TERM GOAL #2   Title  Pt to improve coordination Lt hand as evidenced by reducing speed on 9 hole peg test to 40 sec. or less    Baseline  55.53 sec.     Time  8    Period  Weeks    Status  On-going      OT LONG TERM GOAL #3   Title  Pt to return to cooking full meal demo safety, organization, planning at mod I level with extra time as needed    Time  8    Period  Weeks    Status  On-going      OT LONG TERM GOAL #4   Title  Pt to demo data entry and other computer tasks necessary to perform work related tasks with 100% accuracy    Time  8    Period  Weeks    Status  On-going            Plan - 03/25/17 1412    Clinical Impression Statement  Pt met STG's #2 and #3. Pt also reports cooking at home.     Occupational Profile and client history currently impacting functional performance  HTN, loop recorder placed. Current deficits limiting pt's ability to perform IADLS and work related tasks    Rehab Potential  Good    OT Frequency  2x / week    OT Duration  8 weeks    OT Treatment/Interventions  Self-care/ADL training;DME and/or AE instruction;Therapeutic activities;Therapeutic exercise;Neuromuscular education;Patient/family education;Cognitive remediation/compensation;Fluidtherapy;Functional Mobility Training    Plan  Theraband HEP for LUE, Gripper activity for  hand strength and graded pressure/proprioceptive feedback Lt hand, practice carrying cup 1/2 full of H20 Lt hand w/ ambulation    Consulted and Agree with Plan of Care  Patient       Patient will  benefit from skilled therapeutic intervention in order to improve the following deficits and impairments:  Decreased coordination, Impaired sensation, Impaired UE functional use, Decreased strength, Decreased activity tolerance, Decreased cognition  Visit Diagnosis: Other lack of coordination  Muscle weakness (generalized)  Hemiplegia and hemiparesis following unspecified cerebrovascular disease affecting left non-dominant side (HCC)  Other disturbances of skin sensation  Other symptoms and signs involving cognitive functions following cerebral infarction    Problem List Patient Active Problem List   Diagnosis Date Noted  . Hyperglycemia   . Poor nutrition   . Gastroesophageal reflux disease   . Hyperlipidemia   . Morbid obesity ([place])   . Acute ischemic right MCA stroke ([place]) 02/17/2017  . Cryptogenic stroke ([place]) 02/12/2017  . Allergic rhinitis 11/07/2016  . BMI 39.0-39.9,adult 11/07/2016  . Essential hypertension 10/18/2016    Carey Bullocks, OTR/L 03/25/2017, 2:16 PM  [facility] [address] [place], [place], [address] Phone: [phone]   Fax:  [phone]  Name: [name] MRN: [id number] Date of Birth: 1966/01/13

## 2017-03-25 NOTE — Patient Instructions (Signed)
   Always test temperature of water with Rt hand first (for showers, washing dishes, washing hands, etc)  Do not use Lt hand to carry anything hot, heavy, sharp, or breakable.   Be careful cutting food - make sure you don't get Lt hand/fingers to close OR use cut resistant glove

## 2017-03-25 NOTE — Therapy (Signed)
[facility] [address] [place], [place], [address] Phone: [phone]   Fax:  [phone]  Physical Therapy Treatment  Patient Details  Name: [name] MRN: [id number] Date of Birth: 09-28-1965 Referring Provider: Dr. Allena Katz   Encounter Date: 03/25/2017  PT End of Session - 03/25/17 1440    Visit Number  4    Number of Visits  9    Date for PT Re-Evaluation  04/04/17    Authorization Type  BCBS-90 visits combined (all 3 disciplines)     PT Start Time  1411 pt agreeable to short session in order to be seen sooner    PT Stop Time  1445    PT Time Calculation (min)  34 min    Equipment Utilized During Treatment  Gait belt    Activity Tolerance  Patient tolerated treatment well    Behavior During Therapy  [facility] for tasks assessed/performed       Past Medical History:  Diagnosis Date  . Environmental allergies   . Fall from roof    with kidney trauma  . Hypertension 2018  . Stroke [facility])     Past Surgical History:  Procedure Laterality Date  . LOOP RECORDER INSERTION N/A 02/16/2017   Procedure: LOOP RECORDER INSERTION;  Surgeon: Regan Lemming, MD;  Location: [facility] INVASIVE CV LAB;  Service: Cardiovascular;  Laterality: N/A;  . NO PAST SURGERIES    . TEE WITHOUT CARDIOVERSION N/A 02/16/2017   Procedure: TRANSESOPHAGEAL ECHOCARDIOGRAM (TEE);  Surgeon: Jake Bathe, MD;  Location: [facility] ENDOSCOPY;  Service: Cardiovascular;  Laterality: N/A;    There were no vitals filed for this visit.  Subjective Assessment - 03/25/17 1412    Subjective  got an exercise bike over weekend; doing 10 min so far.    Patient Stated Goals  Walk better and improve balance.     Currently in Pain?  No/denies                      [facility] Adult PT Treatment/Exercise - 03/25/17 1433      Knee/Hip Exercises: Aerobic   Stepper  L3.5 x 5 min      Knee/Hip Exercises: Standing   Heel Raises  Both;20 reps    Heel Raises  Limitations  toe raises x 20    Hip Flexion  Both;10 reps;Knee bent    Hip Flexion Limitations  alternating; 2#    Hip Abduction  Both;10 reps;Knee straight    Abduction Limitations  alternating; 2#    Hip Extension  Both;10 reps;Knee straight    Extension Limitations  alternating; 2#    Lateral Step Up  Both;10 reps;Hand Hold: 0;Step Height: 4"    Forward Step Up  Both;10 reps;Hand Hold: 0;Step Height: 4"          Balance Exercises - 03/25/17 1416      Balance Exercises: Standing   Rockerboard  Anterior/posterior;Lateral;Head turns;10 reps    Tandem Gait  Forward;Intermittent upper extremity support;4 reps    Retro Gait  4 reps    Other Standing Exercises  amb on heels/toes 10' x 4 reps each with 1 UE support          PT Short Term Goals - 03/05/17 1206      PT SHORT TERM GOAL #1   Title  same as LTGs        PT Long Term Goals - 03/05/17 1206      PT LONG TERM  GOAL #1   Title  Pt will be IND in HEP to improve deficits listed above. TARGET DATE FOR ALL LTGS: 04/02/17    Status  New      PT LONG TERM GOAL #2   Title  Pt will improve FGA score to >/=28/30 to decr. falls risk.     Status  New      PT LONG TERM GOAL #3   Title  Pt will amb. 1000', IND, over even/uneven terrain to improve functional mobility.     Status  New      PT LONG TERM GOAL #4   Title  Pt will verbalize understanding of CVA risk factors and s/s to prevent additional CVA.     Status  New      PT LONG TERM GOAL #5   Title  Pt will report he is joining a fitness center upon d/c from PT in order to maintain gains made during PT.     Status  New            Plan - 03/25/17 1442    Clinical Impression Statement  Pt tolerated session well today despite shorter session (requested shorter session in order to avoid break in sessions).  Continues to need mod cues for balance activities.  Progressing well with PT and anticipate pt will be ready for d/c soon.    PT Treatment/Interventions   ADLs/Self Care Home Management;Biofeedback;Canalith Repostioning;Therapeutic activities;Therapeutic exercise;Functional mobility training;Stair training;Gait training;DME Instruction;Neuromuscular re-education;Balance training;Patient/family education;Orthotic Fit/Training;Vestibular;Manual techniques    PT Next Visit Plan  continue to progress LE strengthening and balance exercies to progress Pt towards reaching LTGs(2/21)    Consulted and Agree with Plan of Care  Patient       Patient will benefit from skilled therapeutic intervention in order to improve the following deficits and impairments:  Abnormal gait, Decreased endurance, Impaired sensation, Obesity, Decreased knowledge of use of DME, Decreased balance, Decreased mobility, Decreased range of motion, Decreased coordination, Impaired flexibility  Visit Diagnosis: Muscle weakness (generalized)  Hemiplegia and hemiparesis following unspecified cerebrovascular disease affecting left non-dominant side (HCC)  Other disturbances of skin sensation  Other abnormalities of gait and mobility     Problem List Patient Active Problem List   Diagnosis Date Noted  . Hyperglycemia   . Poor nutrition   . Gastroesophageal reflux disease   . Hyperlipidemia   . Morbid obesity (HCC)   . 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 Cryptogenic stroke (HCC) 02/12/2017  . Allergic rhinitis 11/07/2016  . BMI 39.0-39.9,adult 11/07/2016  . Essential hypertension 10/18/2016      Clarita CraneStephanie F Millisa Giarrusso, PT, DPT 03/25/17 2:45 PM    [facility] [address] [place], [place], [address] Phone: [phone]   Fax:  [phone]  Name: [name] MRN: [id number] Date of Birth: [birth year]

## 2017-03-26 ENCOUNTER — Ambulatory Visit: Payer: BLUE CROSS/BLUE SHIELD | Admitting: Occupational Therapy

## 2017-03-26 ENCOUNTER — Ambulatory Visit: Payer: BLUE CROSS/BLUE SHIELD

## 2017-03-26 ENCOUNTER — Ambulatory Visit: Payer: BLUE CROSS/BLUE SHIELD | Admitting: Speech Pathology

## 2017-03-26 DIAGNOSIS — R208 Other disturbances of skin sensation: Secondary | ICD-10-CM

## 2017-03-26 DIAGNOSIS — R41841 Cognitive communication deficit: Secondary | ICD-10-CM

## 2017-03-26 DIAGNOSIS — I69954 Hemiplegia and hemiparesis following unspecified cerebrovascular disease affecting left non-dominant side: Secondary | ICD-10-CM

## 2017-03-26 DIAGNOSIS — R278 Other lack of coordination: Secondary | ICD-10-CM | POA: Diagnosis not present

## 2017-03-26 DIAGNOSIS — M6281 Muscle weakness (generalized): Secondary | ICD-10-CM

## 2017-03-26 DIAGNOSIS — R2689 Other abnormalities of gait and mobility: Secondary | ICD-10-CM

## 2017-03-26 NOTE — Therapy (Signed)
[facility] [address] [place], [place], [address] Phone: [phone]   Fax:  [phone]  Occupational Therapy Treatment  Patient Details  Name: Jesus IlesChester Bango III MRN: [id number] Date of Birth: 01/07/1966 Referring Provider: Dr. Allena KatzPatel   Encounter Date: 03/26/2017  OT End of Session - 03/26/17 0927    Visit Number  5    Number of Visits  17    Date for OT Re-Evaluation  05/02/17    Authorization Type  BC/BS    Authorization Time Period  90 combined    OT Start Time  0845    OT Stop Time  0930    OT Time Calculation (min)  45 min    Activity Tolerance  Patient tolerated treatment well    Behavior During Therapy  [facility] for tasks assessed/performed       Past Medical History:  Diagnosis Date  . Environmental allergies   . Fall from roof    with kidney trauma  . Hypertension 2018  . Stroke [facility](HCC)     Past Surgical History:  Procedure Laterality Date  . LOOP RECORDER INSERTION N/A 02/16/2017   Procedure: LOOP RECORDER INSERTION;  Surgeon: Regan Lemmingamnitz, Will Martin, MD;  [facility]: [facility] INVASIVE CV LAB;  Service: Cardiovascular;  Laterality: N/A;  . NO PAST SURGERIES    . TEE WITHOUT CARDIOVERSION N/A 02/16/2017   Procedure: TRANSESOPHAGEAL ECHOCARDIOGRAM (TEE);  Surgeon: Jake BatheSkains, Mark C, MD;  [facility];  Service: Cardiovascular;  Laterality: N/A;    There were no vitals filed for this visit.  Subjective Assessment - 03/26/17 0848    Pertinent History  CVA 02/12/17, HTN     Limitations  **Loop recorder, no driving, no strenuous activtity    Patient Stated Goals  Get back to work, get back to driving    Currently in Pain?  No/denies                   OT Treatments/Exercises (OP) - 03/26/17 0001      ADLs   Functional Mobility  Ambulating while carrying cup of water in Lt hand (first time 1/2 way full, second time 3/4 full) w/o drops or spills but had to focus on Lt hand visually d/t decreased  sensation Lt hand      Exercises   Exercises  Shoulder;Hand      Shoulder Exercises: ROM/Strengthening   Other ROM/Strengthening Exercises  Pt issued UE theraband HEP - pt demo each x 10 reps bilaterally. See pt instructions for details. Pt issued red resistance band      Hand Exercises   Other Hand Exercises  Gripper set at level 3 resistance to pick up blocks Lt hand for sustained grip strength and graded pressure/proprioceptive feedback. Pt with mod to max drops/difficulty and 1 rest break required             OT Education - 03/26/17 0904    Education provided  Yes    Education Details  theraband HEP    Person(s) Educated  Patient    Methods  Explanation;Demonstration;Verbal cues;Handout    Comprehension  Verbalized understanding;Returned demonstration;Verbal cues required       OT Short Term Goals - 03/25/17 1338      OT SHORT TERM GOAL #1   Title  Independent with coordination and putty HEP     Time  4    Period  Weeks    Status  Achieved      OT SHORT TERM GOAL #2  Title  Pt to verbalize safety considerations with sensory loss Lt side    Time  4    Period  Weeks    Status  Achieved      OT SHORT TERM GOAL #3   Title  Pt to tie shoes mod I level with extra time as needed    Time  4    Period  Weeks    Status  Achieved      OT SHORT TERM GOAL #4   Title  Pt to divide attention between environmental scanning and physical or cognitive task with 90% accuracy    Time  4    Period  Weeks    Status  On-going      OT SHORT TERM GOAL #5   Title  Pt to improve grip strength Lt hand to 52 lbs average or higher    Baseline  47 lbs    Time  4    Period  Weeks    Status  On-going        OT Long Term Goals - 03/19/17 1440      OT LONG TERM GOAL #1   Title  Pt to be independent with theraband HEP for LUE    Time  8    Period  Weeks    Status  On-going      OT LONG TERM GOAL #2   Title  Pt to improve coordination Lt hand as evidenced by reducing speed on  9 hole peg test to 40 sec. or less    Baseline  55.53 sec.     Time  8    Period  Weeks    Status  On-going      OT LONG TERM GOAL #3   Title  Pt to return to cooking full meal demo safety, organization, planning at mod I level with extra time as needed    Time  8    Period  Weeks    Status  On-going      OT LONG TERM GOAL #4   Title  Pt to demo data entry and other computer tasks necessary to perform work related tasks with 100% accuracy    Time  8    Period  Weeks    Status  On-going            Plan - 03/26/17 0927    Clinical Impression Statement  Pt progressing towards goals    Occupational Profile and client history currently impacting functional performance  HTN, loop recorder placed. Current deficits limiting pt's ability to perform IADLS and work related tasks    Occupational performance deficits (Please refer to evaluation for details):  ADL's;IADL's;Work    Rehab Potential  Good    OT Frequency  2x / week    OT Duration  8 weeks    OT Treatment/Interventions  Self-care/ADL training;DME and/or AE instruction;Therapeutic activities;Therapeutic exercise;Neuromuscular education;Patient/family education;Cognitive remediation/compensation;Fluidtherapy;Functional Mobility Training    Plan  continue coordination and grip strength Lt hand, divided attention tasks, high level organization/problem solving tasks    Consulted and Agree with Plan of Care  Patient       Patient will benefit from skilled therapeutic intervention in order to improve the following deficits and impairments:  Decreased coordination, Impaired sensation, Impaired UE functional use, Decreased strength, Decreased activity tolerance, Decreased cognition  Visit Diagnosis: Muscle weakness (generalized)  Hemiplegia and hemiparesis following unspecified cerebrovascular disease affecting left non-dominant side (HCC)  Other disturbances of skin sensation  Problem List Patient Active Problem List    Diagnosis Date Noted  . Hyperglycemia   . Poor nutrition   . Gastroesophageal reflux disease   . Hyperlipidemia   . Morbid obesity (HCC)   . 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 Cryptogenic stroke (HCC) 02/12/2017  . Allergic rhinitis 11/07/2016  . BMI 39.0-39.9,adult 11/07/2016  . Essential hypertension 10/18/2016    Kelli Churn, OTR/L 03/26/2017, 9:29 AM  [facility] [address] [place], [place], [address] Phone: [phone]   Fax:  [phone]  Name: Kailand Seda III MRN: [id number] Date of Birth: [birth year]

## 2017-03-26 NOTE — Patient Instructions (Signed)
   Strengthening: Resisted Flexion   Hold tubing with __Lt/Rt___ arm(s) at side. Pull forward and up. Move shoulder through pain-free range of motion. Repeat __10__ times per set.  Do _1-2_ sessions per day , every other day   Strengthening: Resisted Extension   Hold tubing in __Lt/Rt___ hand(s), arm forward. Pull arm back, elbow straight. Repeat _10___ times per set. Do _1-2___ sessions per day, every other day.   Resisted Horizontal Abduction: Bilateral   Sit or stand, tubing in both hands, arms out in front. Keeping arms straight, pinch shoulder blades together and stretch arms out. Repeat _10___ times per set. Do _1-2___ sessions per day, every other day.   Elbow Flexion: Resisted   With tubing held in __Lt/Rt____ hand(s) and other end secured under foot, curl arm up as far as possible. Repeat _10___ times per set. Do _1-2___ sessions per day, every other day.    Elbow Extension: Resisted   Sit in chair with resistive band secured at armrest (or hold with other hand) and _Lt/Rt______ elbow bent. Straighten elbow. Repeat _10___ times per set.  Do _1-2___ sessions per day, every other day.   Copyright  VHI. All rights reserved.

## 2017-03-26 NOTE — Therapy (Signed)
[facility] [address] [place], [place], [address] Phone: [phone]   Fax:  [phone]  Speech Language Pathology Treatment  Patient Details  Name: [name] MRN: [id number] Date of Birth: [birth year] Referring Provider: Dr. Maryla Morrow   Encounter Date: 03/26/2017  End of Session - 03/26/17 1315    Visit Number  5    Number of Visits  8    Date for SLP Re-Evaluation  04/10/17    SLP Start Time  1019    SLP Stop Time   1100    SLP Time Calculation (min)  41 min    Activity Tolerance  Patient tolerated treatment well       Past Medical History:  Diagnosis Date  . Environmental allergies   . Fall from roof    with kidney trauma  . Hypertension 2018  . Stroke [facility])     Past Surgical History:  Procedure Laterality Date  . LOOP RECORDER INSERTION N/A 02/16/2017   Procedure: LOOP RECORDER INSERTION;  Surgeon: Regan Lemming, MD;  [facility] INVAS[facility] CV LAB;  Service: Cardiovascular;  Laterality: N/A;  . NO PAST SURGERIES    . TEE WITHOUT CARDIOVERSION N/A 02/16/2017   Procedure: TRANSESOPHAGEAL ECHOCARDIOGRAM (TEE);  Surgeon: Jake Bathe, MD;  Location: [facility] ENDOSCOPY;  Service: Cardiovascular;  Laterality: N/A;    There were no vitals filed for this visit.  Subjective Assessment - 03/26/17 1022    Subjective  Pt told SLP he made some errors in rounding when adding prices from his order form    Currently in Pain?  No/denies            ADULT SLP TREATMENT - 03/26/17 1024      General Information   Behavior/Cognition  Alert;Cooperative;Pleasant mood    Patient Positioning  Upright in chair      Treatment Provided   Treatment provided  Cognitive-Linquistic      Pain Assessment   Pain Assessment  No/denies pain      Cognitive-Linquistic Treatment   Treatment focused on  Cognition    Skilled Treatment  Pt showed SLP his home assignment (calculating order price) and told SLP he  located errors in his multiplication. In mod complex money/time word problems pt achieved 95% accuracy. SLP cued pt to check for errors; he did identify however required extended time and mod A for correction, attention to detail. In mod complex reasoning task, pt required extended time and mod A question cues.        Assessment / Recommendations / Plan   Plan  Continue with current plan of care      Progression Toward Goals   Progression toward goals  Progressing toward goals       SLP Education - 03/26/17 1314    Education provided  Yes    Education Details  write things down vs working out in Photographer) Educated  Patient    Methods  Explanation    Comprehension  Verbalized understanding;Returned demonstration;Verbal cues required         SLP Long Term Goals - 03/26/17 1029      SLP LONG TERM GOAL #1   Title  Pt will complete moderately complex time/money, reasoning/problem solving tasks with 90% accuracy    Time  3    Period  Weeks    Status  On-going      SLP LONG TERM GOAL #2   Title  Pt will identify  and correct errors in cognitive linguistic tasks in 80% of opportunities.     Time  3    Period  Weeks    Status  On-going      SLP LONG TERM GOAL #3   Title  Pt will demo attention to detail necessary for 100% success in detailed linguistic tasks given self correction, over three sessions    Time  3    Period  Weeks    Status  On-going       Plan - 03/26/17 1315    Clinical Impression Statement  [name] presents today with cont'd higher level cognitive deficits in reasoning, problem solving, organization, awareness, and attention to detail. I recommend cont'd skilled ST to target the above impairments in order to maxmize cognitive function for return to work.    Speech Therapy Frequency  2x / week    Duration  4 weeks    Treatment/Interventions  Compensatory strategies;Patient/family education;SLP instruction and feedback;Functional  tasks;Internal/external aids;Compensatory techniques;Cognitive reorganization    Potential to Achieve Goals  Good    Consulted and Agree with Plan of Care  Patient       Patient will benefit from skilled therapeutic intervention in order to improve the following deficits and impairments:   Cognitive communication deficit    Problem List Patient Active Problem List   Diagnosis Date Noted  . Hyperglycemia   . Poor nutrition   . Gastroesophageal reflux disease   . Hyperlipidemia   . Morbid obesity (HCC)   . 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 Cryptogenic stroke (HCC) 02/12/2017  . Allergic rhinitis 11/07/2016  . BMI 39.0-39.9,adult 11/07/2016  . Essential hypertension 10/18/2016   Rondel BatonMary Beth Future Yeldell, MS, CCC-SLP Speech-Language Pathologist  Arlana LindauMary E Nehemie Casserly 03/26/2017, 1:16 PM  [facility] [address] [place], [place], [address] Phone: [phone]   Fax:  [phone]   Name: [name] MRN: [id number] Date of Birth: January 04, 1966

## 2017-03-26 NOTE — Therapy (Signed)
[facility] [address] [place], [place], [address] Phone: [phone]   Fax:  [phone]  Physical Therapy Treatment  Patient Details  Name: [name] MRN: [id number] Date of Birth: Oct 24, 1965 Referring Provider: Dr. Posey Pronto   Encounter Date: 03/26/2017  PT End of Session - 03/26/17 1017    Visit Number  5    Number of Visits  9    Date for PT Re-Evaluation  04/04/17    Authorization Type  BCBS-90 visits combined (all 3 disciplines)     PT Start Time  0930    PT Stop Time  1015    PT Time Calculation (min)  45 min    Equipment Utilized During Treatment  Gait belt    Activity Tolerance  Patient tolerated treatment well    Behavior During Therapy  [facility] for tasks assessed/performed       Past Medical History:  Diagnosis Date  . Environmental allergies   . Fall from roof    with kidney trauma  . Hypertension 2018  . Stroke [facility])     Past Surgical History:  Procedure Laterality Date  . LOOP RECORDER INSERTION N/A 02/16/2017   Procedure: LOOP RECORDER INSERTION;  Surgeon: Constance Haw, MD;  Location: [place] CV LAB;  Service: Cardiovascular;  Laterality: N/A;  . NO PAST SURGERIES    . TEE WITHOUT CARDIOVERSION N/A 02/16/2017   Procedure: TRANSESOPHAGEAL ECHOCARDIOGRAM (TEE);  Surgeon: Jerline Pain, MD;  Location: [facility] ENDOSCOPY;  Service: Cardiovascular;  Laterality: N/A;    There were no vitals filed for this visit.  Subjective Assessment - 03/26/17 0931    Subjective  pt reports that he is feeling much better now then it has been. pt report hand is still an issues but he has been able to walk around with his dogs without an issue    Pertinent History  HTN, hx fall from roof with kidney trauma    Patient Stated Goals  Walk better and improve balance.     Currently in Pain?  No/denies                    [facility] Adult PT Treatment/Exercise - 03/26/17 1000      Ambulation/Gait    Ambulation/Gait  Yes    Ambulation/Gait Assistance  5: Supervision to ensure safety.    Ambulation/Gait Assistance Details  No overt LOB noted.     Ambulation Distance (Feet)  200 Feet    Assistive device  None    Gait Pattern  Step-through pattern;Decreased stride length;Decreased arm swing - left;Decreased dorsiflexion - left;Decreased trunk rotation    Ambulation Surface  Level;Indoor      High Level Balance   High Level Balance Activities  Backward walking;Sudden stops;Direction changes;Turns;Head turns;Tandem walking           Balance Exercises - 03/26/17 1000      Balance Exercises: Standing   Tandem Stance  Eyes closed;Eyes open;Foam/compliant surface;Intermittent upper extremity support;5 reps;30 secs;Cognitive challenge    Stepping Strategy  Anterior;Posterior;Lateral;Foam/compliant surface;5 reps    Step Ups  Forward;6 inch BOSU    Tandem Gait  Forward;Retro;Intermittent upper extremity support;Foam/compliant surface;5 reps;Cognitive challenge    Retro Gait  Foam/compliant surface;4 reps;Cognitive challenge    Other Standing Exercises  Bosu squats (3x10), tandem on foam reaching across midline in B lateral direction with 12# weight to mimic holding his dog. All activities performed in // bars and min guard to S to ensure safety.  PT Education - 03/26/17 1017    Education provided  Yes    Education Details  exercise progression and home safety with HEP. PT discussed pt's excellent progress and that pt will likely d/c next week after goal assessment.    Person(s) Educated  Patient    Methods  Explanation    Comprehension  Verbalized understanding           PT Short Term Goals - 03/05/17 1206      PT SHORT TERM GOAL #1   Title  same as LTGs        PT Long Term Goals - 03/05/17 1206      PT LONG TERM GOAL #1   Title  Pt will be IND in HEP to improve deficits listed above. TARGET DATE FOR ALL LTGS: 04/02/17    Status  New      PT LONG TERM GOAL #2    Title  Pt will improve FGA score to >/=28/30 to decr. falls risk.     Status  New      PT LONG TERM GOAL #3   Title  Pt will amb. 1000', IND, over even/uneven terrain to improve functional mobility.     Status  New      PT LONG TERM GOAL #4   Title  Pt will verbalize understanding of CVA risk factors and s/s to prevent additional CVA.     Status  New      PT LONG TERM GOAL #5   Title  Pt will report he is joining a fitness center upon d/c from PT in order to maintain gains made during PT.     Status  New          Plan - 03/26/17 1018    Clinical Impression Statement  Pt continues to tolerate increased in high level balance exercise intensity ( BOSU squats, foam tandem scanning, Bosu step up. Etc.) Pt continues to show improvement in confidence during balance exercise but does require cues to improve stepping strategy with RLE, as pt's LLE is weaker than RLE and pt is less confident during L SLS activities. Pt very motivated to improve deficits. Pt is on track to D/C with all LTGs Met, PT will assess next week.    Rehab Potential  Good    Clinical Impairments Affecting Rehab Potential  see above    PT Frequency  2x / week    PT Duration  4 weeks    PT Treatment/Interventions  ADLs/Self Care Home Management;Biofeedback;Canalith Repostioning;Therapeutic activities;Therapeutic exercise;Functional mobility training;Stair training;Gait training;DME Instruction;Neuromuscular re-education;Balance training;Patient/family education;Orthotic Fit/Training;Vestibular;Manual techniques    PT Next Visit Plan  continue to progress LE strengthening and balance exercies to progress Pt towards reaching LTGs(2/21)    Consulted and Agree with Plan of Care  Patient          Patient will benefit from skilled therapeutic intervention in order to improve the following deficits and impairments:  Abnormal gait, Decreased endurance, Impaired sensation, Obesity, Decreased knowledge of use of DME, Decreased  balance, Decreased mobility, Decreased range of motion, Decreased coordination, Impaired flexibility  Visit Diagnosis: Muscle weakness (generalized)  Other disturbances of skin sensation  Other abnormalities of gait and mobility     Problem List Patient Active Problem List   Diagnosis Date Noted  . Hyperglycemia   . Poor nutrition   . Gastroesophageal reflux disease   . Hyperlipidemia   . Morbid obesity ([place])   . Acute ischemic right MCA stroke ([place]) 02/17/2017  .  Cryptogenic stroke ([place]) 02/12/2017  . Allergic rhinitis 11/07/2016  . BMI 39.0-39.9,adult 11/07/2016  . Essential hypertension 10/18/2016    Waunita Schooner SPT 03/26/2017, 12:03 PM  [facility] [address] [place], [place], [address] Phone: [phone]   Fax:  [phone]  Name: [name] MRN: [id number] Date of Birth: 1966/02/08

## 2017-03-27 ENCOUNTER — Ambulatory Visit
Admission: RE | Admit: 2017-03-27 | Discharge: 2017-03-27 | Disposition: A | Discharge status: Home / Self Care | Payer: BLUE CROSS/BLUE SHIELD | Source: Ambulatory Visit | Attending: Physician Assistant | Admitting: Physician Assistant

## 2017-03-27 DIAGNOSIS — E041 Nontoxic single thyroid nodule: Secondary | ICD-10-CM

## 2017-03-30 ENCOUNTER — Ambulatory Visit: Payer: BLUE CROSS/BLUE SHIELD | Admitting: Physical Therapy

## 2017-03-30 ENCOUNTER — Encounter: Payer: Self-pay | Admitting: Physical Therapy

## 2017-03-30 ENCOUNTER — Ambulatory Visit: Payer: BLUE CROSS/BLUE SHIELD | Admitting: Occupational Therapy

## 2017-03-30 ENCOUNTER — Ambulatory Visit: Payer: BLUE CROSS/BLUE SHIELD | Admitting: Speech Pathology

## 2017-03-30 VITALS — BP 142/92

## 2017-03-30 DIAGNOSIS — R41841 Cognitive communication deficit: Secondary | ICD-10-CM

## 2017-03-30 DIAGNOSIS — R278 Other lack of coordination: Secondary | ICD-10-CM | POA: Diagnosis not present

## 2017-03-30 DIAGNOSIS — R2689 Other abnormalities of gait and mobility: Secondary | ICD-10-CM

## 2017-03-30 DIAGNOSIS — I69954 Hemiplegia and hemiparesis following unspecified cerebrovascular disease affecting left non-dominant side: Secondary | ICD-10-CM

## 2017-03-30 DIAGNOSIS — R208 Other disturbances of skin sensation: Secondary | ICD-10-CM

## 2017-03-30 DIAGNOSIS — I69318 Other symptoms and signs involving cognitive functions following cerebral infarction: Secondary | ICD-10-CM

## 2017-03-30 DIAGNOSIS — M6281 Muscle weakness (generalized): Secondary | ICD-10-CM

## 2017-03-30 NOTE — Therapy (Signed)
[facility] [address] [place], [place], [address] Phone: [phone]   Fax:  [phone]  Physical Therapy Treatment  Patient Details  Name: [name] MRN: [id number] Date of Birth: 04-09-1965 Referring Provider: Dr. Posey Pronto   Encounter Date: 03/30/2017  PT End of Session - 03/30/17 0832    Visit Number  6    Number of Visits  9    Date for PT Re-Evaluation  04/04/17    Authorization Type  BCBS-90 visits combined (all 3 disciplines)     PT Start Time  0759    PT Stop Time  0838    PT Time Calculation (min)  39 min    Activity Tolerance  Patient tolerated treatment well    Behavior During Therapy  [facility] for tasks assessed/performed       Past Medical History:  Diagnosis Date  . Environmental allergies   . Fall from roof    with kidney trauma  . Hypertension 2018  . Stroke [facility])     Past Surgical History:  Procedure Laterality Date  . LOOP RECORDER INSERTION N/A 02/16/2017   Procedure: LOOP RECORDER INSERTION;  Surgeon: Constance Haw, MD;  Location: [place] CV LAB;  Service: Cardiovascular;  Laterality: N/A;  . NO PAST SURGERIES    . TEE WITHOUT CARDIOVERSION N/A 02/16/2017   Procedure: TRANSESOPHAGEAL ECHOCARDIOGRAM (TEE);  Surgeon: Jerline Pain, MD;  Location: [facility] ENDOSCOPY;  Service: Cardiovascular;  Laterality: N/A;    Vitals:   03/30/17 0804  BP: (!) 142/92    Subjective Assessment - 03/30/17 0800    Subjective  no new reports.  BP seems to be improving. had some fatigue at the hockey game this weekend, but walking more and less fatigue from last game.  purchased exercise bike for home, will not be joining fitness center (riding at home 2x10 min each day)    Pertinent History  HTN, hx fall from roof with kidney trauma    Patient Stated Goals  Walk better and improve balance.     Currently in Pain?  No/denies                      [name] -  03/30/17 0811      Ambulation/Gait   Ambulation/Gait  Yes    Ambulation/Gait Assistance  7: Independent    Ambulation Distance (Feet)  1100 Feet    Assistive device  None    Gait Pattern  Within Functional Limits    Ambulation Surface  Level;Unlevel;Indoor deferred outdoor amb due to weather    Ramp  7: Independent with compliant surface    Curb  7: Independent with compliant surface      Knee/Hip Exercises: Aerobic   Stepper  L4.0 x 8 min      Knee/Hip Exercises: Standing   Heel Raises  Both;20 reps    Heel Raises Limitations  toe raises x 20    Hip Flexion  Both;15 reps;Knee bent    Hip Flexion Limitations  alternating    Hip Abduction  Both;15 reps;Knee straight    Abduction Limitations  alternating    Hip Extension  Both;15 reps;Knee straight    Extension Limitations  alternating          Balance Exercises - 03/30/17 0814      Balance Exercises: Standing   Standing Eyes Closed  Foam/compliant surface;Narrow base of support (BOS);3 reps;10 secs;Head turns    Tandem Gait  Forward;Retro;Intermittent upper extremity support;5 reps cues to maintain tandem    Retro Gait  4 reps;Upper extremity support intermittent        PT Education - 03/30/17 0831    Education provided  Yes    Education Details  corner balance HEP    Person(s) Educated  Patient    Methods  Explanation;Demonstration;Handout    Comprehension  Verbalized understanding;Returned demonstration;Need further instruction       PT Short Term Goals - 03/05/17 1206      PT SHORT TERM GOAL #1   Title  same as LTGs        PT Long Term Goals - 03/30/17 6222      PT LONG TERM GOAL #1   Title  Pt will be IND in HEP to improve deficits listed above. TARGET DATE FOR ALL LTGS: 04/02/17    Baseline  2/18: met for current HEP; added single new exercise today (need to review)      PT LONG TERM GOAL #2   Title  Pt will improve FGA score to >/=28/30 to decr. falls risk.     Status  On-going      PT LONG TERM  GOAL #3   Title  Pt will amb. 1000', IND, over even/uneven terrain to improve functional mobility.     Baseline  2/18: met indoors; deferred outdoors due to weater    Status  Achieved      PT LONG TERM GOAL #4   Title  Pt will verbalize understanding of CVA risk factors and s/s to prevent additional CVA.     Status  On-going      PT LONG TERM GOAL #5   Title  Pt will report he is joining a fitness center upon d/c from PT in order to maintain gains made during PT.     Baseline  2/18: purchased bike for home; declined joing fitness center at this time    Status  Deferred            Plan - 03/30/17 9798    Clinical Impression Statement  Pt tolerated session well today and anticipate pt will be ready for d/c next visit.    PT Treatment/Interventions  ADLs/Self Care Home Management;Biofeedback;Canalith Repostioning;Therapeutic activities;Therapeutic exercise;Functional mobility training;Stair training;Gait training;DME Instruction;Neuromuscular re-education;Balance training;Patient/family education;Orthotic Fit/Training;Vestibular;Manual techniques    PT Next Visit Plan  check remaining goals, d/c, FOTO    Consulted and Agree with Plan of Care  Patient       Patient will benefit from skilled therapeutic intervention in order to improve the following deficits and impairments:  Abnormal gait, Decreased endurance, Impaired sensation, Obesity, Decreased knowledge of use of DME, Decreased balance, Decreased mobility, Decreased range of motion, Decreased coordination, Impaired flexibility  Visit Diagnosis: Muscle weakness (generalized)  Other disturbances of skin sensation  Other abnormalities of gait and mobility  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 Hyperglycemia   . Poor nutrition   . Gastroesophageal reflux disease   .  Hyperlipidemia   . Morbid obesity ([place])   . Acute ischemic right MCA stroke ([place]) 02/17/2017  . Cryptogenic stroke ([place]) 02/12/2017  . Allergic rhinitis 11/07/2016  . BMI 39.0-39.9,adult 11/07/2016  . Essential hypertension 10/18/2016      Laureen Abrahams, PT, DPT 03/30/17 8:35 AM    [facility] [address]  [place], [place], [address] Phone: [phone]   Fax:  [phone]  Name: [name] MRN: [id number] Date of Birth: [birth year]

## 2017-03-30 NOTE — Therapy (Signed)
[facility] [address] [place], [place], [address] Phone: [phone]   Fax:  [phone]  Occupational Therapy Treatment  Patient Details  Name: Jesus IlesChester Briles III MRN: [id number] Date of Birth: [birth year] Referring Provider: Dr. Allena KatzPatel   Encounter Date: 03/30/2017  OT End of Session - 03/30/17 1014    Visit Number  6    Number of Visits  17    Date for OT Re-Evaluation  05/02/17    Authorization Type  BC/BS    Authorization Time Period  90 combined    OT Start Time  0930    OT Stop Time  1015    OT Time Calculation (min)  45 min    Activity Tolerance  Patient tolerated treatment well    Behavior During Therapy  [facility] for tasks assessed/performed       Past Medical History:  Diagnosis Date  . Environmental allergies   . Fall from roof    with kidney trauma  . Hypertension 2018  . Stroke [facility](HCC)     Past Surgical History:  Procedure Laterality Date  . LOOP RECORDER INSERTION N/A 02/16/2017   Procedure: LOOP RECORDER INSERTION;  Surgeon: Regan Lemmingamnitz, [place] Martin, MD;  Location: [facility] INVASIVE CV LAB;  Service: Cardiovascular;  Laterality: N/A;  . NO PAST SURGERIES    . TEE WITHOUT CARDIOVERSION N/A 02/16/2017   Procedure: TRANSESOPHAGEAL ECHOCARDIOGRAM (TEE);  Surgeon: Jake BatheSkains, Mark C, MD;  [facility];  Service: Cardiovascular;  Laterality: N/A;    There were no vitals filed for this visit.  Subjective Assessment - 03/30/17 0933    Subjective   I went [place] the [employer] game on Saturday and I didn't get as tired as I did 2 weeks before    Pertinent History  CVA 02/12/17, HTN     Limitations  **Loop recorder, no driving, no strenuous activtity    Patient Stated Goals  Get back to work, get back to driving    Currently in Pain?  No/denies                   OT Treatments/Exercises (OP) - 03/30/17 0001      Cognitive Exercises   Organization  "Organizing Your Day" task (complex): Pt listed all  errands however did not perform in the best order (decreased reasoning/problem solving) and getting groceries before other errands when 90* outside      Hand Exercises   Other Hand Exercises  Coordination: Pt manipulating up to 5 small pegs at a time Lt hand to place in pegboard with mod drops/difficulty, and extra time      Visual/Perceptual Exercises   Scanning - Environmental  Pt performing evironmental scanning while performing category generation for dual tasking: pt found 11/13 items                OT Short Term Goals - 03/25/17 1338      OT SHORT TERM GOAL #1   Title  Independent with coordination and putty HEP     Time  4    Period  Weeks    Status  Achieved      OT SHORT TERM GOAL #2   Title  Pt to verbalize safety considerations with sensory loss Lt side    Time  4    Period  Weeks    Status  Achieved      OT SHORT TERM GOAL #3   Title  Pt to tie shoes mod I level with extra  time as needed    Time  4    Period  Weeks    Status  Achieved      OT SHORT TERM GOAL #4   Title  Pt to divide attention between environmental scanning and physical or cognitive task with 90% accuracy    Time  4    Period  Weeks    Status  On-going      OT SHORT TERM GOAL #5   Title  Pt to improve grip strength Lt hand to 52 lbs average or higher    Baseline  47 lbs    Time  4    Period  Weeks    Status  On-going        OT Long Term Goals - 03/19/17 1440      OT LONG TERM GOAL #1   Title  Pt to be independent with theraband HEP for LUE    Time  8    Period  Weeks    Status  On-going      OT LONG TERM GOAL #2   Title  Pt to improve coordination Lt hand as evidenced by reducing speed on 9 hole peg test to 40 sec. or less    Baseline  55.53 sec.     Time  8    Period  Weeks    Status  On-going      OT LONG TERM GOAL #3   Title  Pt to return to cooking full meal demo safety, organization, planning at mod I level with extra time as needed    Time  8    Period  Weeks     Status  On-going      OT LONG TERM GOAL #4   Title  Pt to demo data entry and other computer tasks necessary to perform work related tasks with 100% accuracy    Time  8    Period  Weeks    Status  On-going            Plan - 03/30/17 1015    Clinical Impression Statement  Pt progressing towards goals. Pt very verbose t/o sessions    Occupational Profile and client history currently impacting functional performance  HTN, loop recorder placed. Current deficits limiting pt's ability to perform IADLS and work related tasks    Rehab Potential  Good    OT Frequency  2x / week    OT Duration  8 weeks    OT Treatment/Interventions  Self-care/ADL training;DME and/or AE instruction;Therapeutic activities;Therapeutic exercise;Neuromuscular education;Patient/family education;Cognitive remediation/compensation;Fluidtherapy;Functional Mobility Training    Plan  grip strength, organization task #1    Consulted and Agree with Plan of Care  Patient       Patient will benefit from skilled therapeutic intervention in order to improve the following deficits and impairments:  Decreased coordination, Impaired sensation, Impaired UE functional use, Decreased strength, Decreased activity tolerance, Decreased cognition  Visit Diagnosis: 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 Hyperglycemia   . Poor nutrition   . Gastroesophageal reflux disease   . Hyperlipidemia   . Morbid obesity (HCC)   . 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 Cryptogenic stroke (HCC) 02/12/2017  . Allergic rhinitis 11/07/2016  . BMI 39.0-39.9,adult 11/07/2016  . Essential hypertension  10/18/2016    Kelli Churn, OTR/L 03/30/2017, 10:17  AM  [facility] [address] [place], [place], [address] Phone: [phone]   Fax:  [phone]  Name: Quinnlan Abruzzo III MRN: [id number] Date of Birth: 03/30/1965

## 2017-03-30 NOTE — Patient Instructions (Signed)
Feet Together (Compliant Surface) Head Motion - Eyes Closed    Stand on compliant surface: __pillow or cushion____ with feet together. Close eyes and move head slowly, up and down 10 times each way and side to side 10 times each way. Repeat _1-2___ times per session. Do ___1-2_ sessions per day.  Copyright  VHI. All rights reserved.   

## 2017-03-30 NOTE — Therapy (Signed)
[facility] [address] [place], [place], [address] Phone: [phone]   Fax:  [phone]  Speech Language Pathology Treatment  Patient Details  Name: [name] MRN: [id number] Date of Birth: 11-08-1965 Referring Provider: Dr. Maryla Morrow   Encounter Date: 03/30/2017  End of Session - 03/30/17 0934    Visit Number  6    Number of Visits  8    Date for SLP Re-Evaluation  04/10/17    SLP Start Time  0846    SLP Stop Time   0929    SLP Time Calculation (min)  43 min    Activity Tolerance  Patient tolerated treatment well       Past Medical History:  Diagnosis Date  . Environmental allergies   . Fall from roof    with kidney trauma  . Hypertension 2018  . Stroke [facility])     Past Surgical History:  Procedure Laterality Date  . LOOP RECORDER INSERTION N/A 02/16/2017   Procedure: LOOP RECORDER INSERTION;  Surgeon: Regan Lemming, MD;  [facility];  Service: Cardiovascular;  Laterality: N/A;  . NO PAST SURGERIES    . TEE WITHOUT CARDIOVERSION N/A 02/16/2017   Procedure: TRANSESOPHAGEAL ECHOCARDIOGRAM (TEE);  Surgeon: Jake Bathe, MD;  Location: [facility] ENDOSCOPY;  Service: Cardiovascular;  Laterality: N/A;    There were no vitals filed for this visit.  Subjective Assessment - 03/30/17 0846    Subjective  "I probably took as long as I normally would've took on it."    Currently in Pain?  No/denies            ADULT SLP TREATMENT - 03/30/17 0846      General Information   Behavior/Cognition  Alert;Cooperative;Pleasant mood    Patient Positioning  Upright in chair      Treatment Provided   Treatment provided  Cognitive-Linquistic      Pain Assessment   Pain Assessment  No/denies pain      Cognitive-Linquistic Treatment   Treatment focused on  Cognition    Skilled Treatment  Pt responded tangentially to simple question about his weekend; subtle non-verbal cues not effective  for redirection, and SLP verbally redirected. Pt told SLP he felt his homework took him a long time but no more than usual. In mod complex reasoning task, pt required extended time, mod A for error awareness. SLP told pt extended time is due to decreased attention to detail and error awareness, and pt would not likely have required this much time prior to his stroke. In simple-mod complex attention to detail task, pt with 60% accuracy, required verbal cue to check for errors. ID'd 1 error however required min A question cues to correct. Pt to review and correct remaining errors at home.      Assessment / Recommendations / Plan   Plan  Continue with current plan of care      Progression Toward Goals   Progression toward goals  Progressing toward goals           SLP Long Term Goals - 03/30/17 0936      SLP LONG TERM GOAL #1   Title  Pt will complete moderately complex time/money, reasoning/problem solving tasks with 90% accuracy    Time  2    Period  Weeks    Status  On-going      SLP LONG TERM GOAL #2   Title  Pt will identify and correct errors in cognitive linguistic  tasks in 80% of opportunities.     Time  2    Period  Weeks    Status  On-going      SLP LONG TERM GOAL #3   Title  Pt will demo attention to detail necessary for 100% success in detailed linguistic tasks given self correction, over three sessions    Time  2    Period  Weeks    Status  On-going       Plan - 03/30/17 0935    Clinical Impression Statement  [name] presents today with cont'd higher level cognitive deficits in reasoning, problem solving, organization, awareness, and attention to detail. I recommend cont'd skilled ST to target the above impairments in order to maxmize cognitive function for return to work.    Speech Therapy Frequency  2x / week    Duration  4 weeks    Treatment/Interventions  Compensatory strategies;Patient/family education;SLP instruction and feedback;Functional  tasks;Internal/external aids;Compensatory techniques;Cognitive reorganization    Potential to Achieve Goals  Good    Consulted and Agree with Plan of Care  Patient       Patient will benefit from skilled therapeutic intervention in order to improve the following deficits and impairments:   Cognitive communication deficit    Problem List Patient Active Problem List   Diagnosis Date Noted  . Hyperglycemia   . Poor nutrition   . Gastroesophageal reflux disease   . Hyperlipidemia   . Morbid obesity (HCC)   . 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 Cryptogenic stroke (HCC) 02/12/2017  . Allergic rhinitis 11/07/2016  . BMI 39.0-39.9,adult 11/07/2016  . Essential hypertension 10/18/2016   Jesus BatonMary Beth Brittni Hult, [name], [name] Speech-Language Pathologist  [name] 03/30/2017, 9:37 AM  [facility] [address] [place], [place], [address] Phone: [phone]   Fax:  [phone]   Name: [name] MRN: [id number] Date of Birth: [birth year]

## 2017-04-02 ENCOUNTER — Ambulatory Visit: Payer: BLUE CROSS/BLUE SHIELD | Admitting: Occupational Therapy

## 2017-04-02 ENCOUNTER — Ambulatory Visit: Payer: BLUE CROSS/BLUE SHIELD

## 2017-04-02 ENCOUNTER — Ambulatory Visit: Payer: BLUE CROSS/BLUE SHIELD | Admitting: Speech Pathology

## 2017-04-02 ENCOUNTER — Other Ambulatory Visit: Payer: Self-pay | Admitting: Physician Assistant

## 2017-04-02 DIAGNOSIS — I69318 Other symptoms and signs involving cognitive functions following cerebral infarction: Secondary | ICD-10-CM

## 2017-04-02 DIAGNOSIS — M6281 Muscle weakness (generalized): Secondary | ICD-10-CM

## 2017-04-02 DIAGNOSIS — R2689 Other abnormalities of gait and mobility: Secondary | ICD-10-CM

## 2017-04-02 DIAGNOSIS — I69954 Hemiplegia and hemiparesis following unspecified cerebrovascular disease affecting left non-dominant side: Secondary | ICD-10-CM

## 2017-04-02 DIAGNOSIS — R41841 Cognitive communication deficit: Secondary | ICD-10-CM

## 2017-04-02 DIAGNOSIS — E041 Nontoxic single thyroid nodule: Secondary | ICD-10-CM | POA: Insufficient documentation

## 2017-04-02 DIAGNOSIS — R278 Other lack of coordination: Secondary | ICD-10-CM | POA: Diagnosis not present

## 2017-04-02 NOTE — Therapy (Signed)
[facility] [address] [place], [place], [address] Phone: [phone]   Fax:  [phone]  Physical Therapy Treatment  Patient Details  Name: [name] MRN: [id number] Date of Birth: 04/20/1965 Referring Provider: Dr. Posey Pronto   Encounter Date: 04/02/2017   PT End of Session - 04/02/17 0916    Visit Number  7    Number of Visits  9    Date for PT Re-Evaluation  04/04/17    Authorization Type  [employer]-90 visits combined (all 3 disciplines)     PT Start Time  0847    PT Stop Time  0907    PT Time Calculation (min)  20 min    Equipment Utilized During Treatment  Gait belt    Activity Tolerance  Patient tolerated treatment well    Behavior During Therapy  [facility] for tasks assessed/performed         Past Medical History:  Diagnosis Date  . Environmental allergies   . Fall from roof    with kidney trauma  . Hypertension 2018  . Stroke [facility])     Past Surgical History:  Procedure Laterality Date  . LOOP RECORDER INSERTION N/A 02/16/2017   Procedure: LOOP RECORDER INSERTION;  Surgeon: Constance Haw, MD;  Location: [facility] CV LAB;  Service: Cardiovascular;  Laterality: N/A;  . NO PAST SURGERIES    . TEE WITHOUT CARDIOVERSION N/A 02/16/2017   Procedure: TRANSESOPHAGEAL ECHOCARDIOGRAM (TEE);  Surgeon: Jerline Pain, MD;  Location: [facility] ENDOSCOPY;  Service: Cardiovascular;  Laterality: N/A;    There were no vitals filed for this visit.  Subjective Assessment - 04/02/17 0848    Subjective  pt reports no fall and is not in any pain today. pt reports going to a hockey game last week and having no issues going up and down stairs or any of the additional walking    Pertinent History  HTN, hx fall from roof with kidney trauma    Patient Stated Goals  Walk better and improve balance.     Currently in Pain?  No/denies         [facility] PT Assessment - 04/02/17 0845      Functional Gait  Assessment   Gait assessed    Yes    Gait Level Surface  Walks 20 ft in less than 5.5 sec, no assistive devices, good speed, no evidence for imbalance, normal gait pattern, deviates no more than 6 in outside of the 12 in walkway width.    Change in Gait Speed  Able to smoothly change walking speed without loss of balance or gait deviation. Deviate no more than 6 in outside of the 12 in walkway width.    Gait with Horizontal Head Turns  Performs head turns smoothly with no change in gait. Deviates no more than 6 in outside 12 in walkway width    Gait with Vertical Head Turns  Performs head turns with no change in gait. Deviates no more than 6 in outside 12 in walkway width.    Gait and Pivot Turn  Pivot turns safely in greater than 3 sec and stops with no loss of balance, or pivot turns safely within 3 sec and stops with mild imbalance, requires small steps to catch balance.    Step Over Obstacle  Is able to step over 2 stacked shoe boxes taped together (9 in total height) without changing gait speed. No evidence of imbalance.    Gait with Narrow Base of  Support  Ambulates 7-9 steps.    Gait with Eyes Closed  Walks 20 ft, no assistive devices, good speed, no evidence of imbalance, normal gait pattern, deviates no more than 6 in outside 12 in walkway width. Ambulates 20 ft in less than 7 sec.    Ambulating Backwards  Walks 20 ft, no assistive devices, good speed, no evidence for imbalance, normal gait    Steps  Alternating feet, no rail.    Total Score  28    FGA comment:  28/30 indicates pt is at a low fall risk                   [facility] Adult PT Treatment/Exercise - 04/02/17 0845      Ambulation/Gait   Ambulation/Gait  Yes    Ambulation/Gait Assistance  7: Independent    Ambulation Distance (Feet)  250 Feet    Assistive device  None    Gait Pattern  Step-through pattern;Decreased stride length    Ambulation Surface  Level;Indoor      High Level Balance   High Level Balance Activities  Head turns           Balance Exercises - 04/02/17 0845      Balance Exercises: Standing   Standing Eyes Opened  Narrow base of support (BOS);Head turns;Foam/compliant surface;30 secs;1 rep    Standing Eyes Closed  Narrow base of support (BOS);Foam/compliant surface;Head turns;30 secs;1 rep no sway or LOB       Self Care: PT Education - 04/02/17 0915    Education provided  Yes    Education Details  CVA risk factors, CVA risk reduciton, CVA signs and symptoms, PT D/C, pt HEP    Person(s) Educated  Patient    Methods  Verbal cues    Comprehension  Verbalized understanding       PT Short Term Goals - 03/05/17 1206      PT SHORT TERM GOAL #1   Title  same as LTGs        PT Long Term Goals - 04/02/17 0924      PT LONG TERM GOAL #1   Title  Pt will be IND in HEP to improve deficits listed above. TARGET DATE FOR ALL LTGS: 04/02/17    Baseline  2/18: met for current HEP; added single new exercise today (need to review)    Status  Achieved    Target Date  04/02/17      PT LONG TERM GOAL #2   Title  Pt will improve FGA score to >/=28/30 to decr. falls risk.     Baseline  21/30 to 28/30 on 04/02/2017    Status  Achieved    Target Date  04/02/17      PT LONG TERM GOAL #3   Title  Pt will amb. 1000', IND, over even/uneven terrain to improve functional mobility.     Baseline  2/18: met indoors; deferred outdoors due to weater    Status  Achieved      PT LONG TERM GOAL #4   Title  Pt will verbalize understanding of CVA risk factors and s/s to prevent additional CVA.     Baseline  Pt communicated risk factors, reduction and s/s of CVA in order to prevent additional CVA    Status  Achieved      PT LONG TERM GOAL #5   Title  Pt will report he is joining a fitness center upon d/c from PT in order to maintain gains made  during PT.     Baseline  2/18: purchased bike for home; declined joing fitness center at this time    Status  Deferred              Plan - 04/02/17 9381    Clinical  Impression Statement  Today PT and patient discussed, and agree on, discharge from physical therapy treatment based on PT meeting all of his LTGs, improved balance and gait, independence in HEP/ long term fitness plan. Pt also has demonstrated significant improvement of understanding of CVA risk factors and how to reduce them. During this session PT completed the functional gait assessment and scored a 28/30(initial score was 21/30). This improved score demonstrates his progression in reducing his falls risk with functional gait activates( he is now in the low fall risk group). Patient has been instructed to continue HEP and risk factor reduction to avoid future injury.  Pt met all LTGs, except for LTG gym goal, as it was deferred due to pt obtaining bike for exercise at home. Please see d/c summary.     Rehab Potential  Good    Clinical Impairments Affecting Rehab Potential  see above    PT Treatment/Interventions  ADLs/Self Care Home Management;Biofeedback;Canalith Repostioning;Therapeutic activities;Therapeutic exercise;Functional mobility training;Stair training;Gait training;DME Instruction;Neuromuscular re-education;Balance training;Patient/family education;Orthotic Fit/Training;Vestibular;Manual techniques    PT Next Visit Plan  D/C    Consulted and Agree with Plan of Care  Patient         Patient will benefit from skilled therapeutic intervention in order to improve the following deficits and impairments:  Abnormal gait, Decreased endurance, Impaired sensation, Obesity, Decreased knowledge of use of DME, Decreased balance, Decreased mobility, Decreased range of motion, Decreased coordination, Impaired flexibility  Visit Diagnosis: Muscle weakness (generalized)  Other abnormalities of gait and mobility  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  Hyperglycemia   . Poor nutrition   . Gastroesophageal reflux disease   . Hyperlipidemia   . Morbid obesity ([place])   . Acute ischemic right MCA stroke ([place]) 02/17/2017  . Cryptogenic stroke ([place]) 02/12/2017  . Allergic rhinitis 11/07/2016  . BMI 39.0-39.9,adult 11/07/2016  . Essential hypertension 10/18/2016    PHYSICAL THERAPY DISCHARGE SUMMARY  Visits from Start of Care: 7  Current functional level related to goals / functional outcomes: PT Long Term Goals - 04/02/17 0924      PT LONG TERM GOAL #1   Title  Pt will be IND in HEP to improve deficits listed above. TARGET DATE FOR ALL LTGS: 04/02/17    Baseline  2/18: met for current HEP; added single new exercise today (need to review)    Status  Achieved    Target Date  04/02/17      PT LONG TERM GOAL #2   Title  Pt will improve FGA score to >/=28/30 to decr. falls risk.     Baseline  21/30 to 28/30 on 04/02/2017    Status  On-going    Target Date  04/02/17      PT LONG TERM GOAL #3   Title  Pt will amb. 1000', IND, over even/uneven terrain to improve functional mobility.     Baseline  2/18: met indoors; deferred outdoors due to weater    Status  Achieved      PT LONG TERM GOAL #4   Title  Pt will verbalize understanding of CVA  risk factors and s/s to prevent additional CVA.     Baseline  Pt communicated risk factors, reduction and s/s of CVA in order to prevent additional CVA    Status  Achieved      PT LONG TERM GOAL #5   Title  Pt will report he is joining a fitness center upon d/c from PT in order to maintain gains made during PT.     Baseline  2/18: purchased bike for home; declined joing fitness center at this time    Status  Deferred         Remaining deficits: See above   Education / Equipment: See above  Plan: Patient agrees to discharge.  Patient goals were met. Patient is being discharged due to meeting the stated rehab goals.  ?????      Waunita Schooner SPT 04/02/2017, 9:29 AM  [facility] [address] [place], [place], [address] Phone: [phone]   Fax:  [phone]  Name: [name] MRN: [id number] Date of Birth: [birth year]

## 2017-04-02 NOTE — Therapy (Signed)
[facility] [address] [place], [place], [address] Phone: [phone]   Fax:  [phone]  Speech Language Pathology Treatment  Patient Details  Name: [name] MRN: [id number] Date of Birth: [birth year] Referring Provider: Dr. Maryla Morrow   Encounter Date: 04/02/2017  End of Session - 04/02/17 0943    Visit Number  7    Number of Visits  17    Date for SLP Re-Evaluation  05/08/17    SLP Start Time  0933    SLP Stop Time   1012    SLP Time Calculation (min)  39 min    Activity Tolerance  Patient tolerated treatment well       Past Medical History:  Diagnosis Date  . Environmental allergies   . Fall from roof    with kidney trauma  . Hypertension 2018  . Stroke [facility])     Past Surgical History:  Procedure Laterality Date  . LOOP RECORDER INSERTION N/A 02/16/2017   Procedure: LOOP RECORDER INSERTION;  Surgeon: Regan Lemming, MD;  Location: [facility] INVASIVE CV LAB;  Service: Cardiovascular;  Laterality: N/A;  . NO PAST SURGERIES    . TEE WITHOUT CARDIOVERSION N/A 02/16/2017   Procedure: TRANSESOPHAGEAL ECHOCARDIOGRAM (TEE);  Surgeon: Jake Bathe, MD;  Location: [facility] ENDOSCOPY;  Service: Cardiovascular;  Laterality: N/A;    There were no vitals filed for this visit.  Subjective Assessment - 04/02/17 0934    Subjective  "I have to let them know next week if I'm not going back to work."    Currently in Pain?  No/denies            ADULT SLP TREATMENT - 04/02/17 0933      General Information   Behavior/Cognition  Alert;Cooperative;Pleasant mood    Patient Positioning  Upright in chair      Treatment Provided   Treatment provided  Cognitive-Linquistic      Pain Assessment   Pain Assessment  No/denies pain      Cognitive-Linquistic Treatment   Treatment focused on  Cognition    Skilled Treatment  Pt told SLP he figured out his mistakes in attention to detail. Pt had corrected 2/3 errors,  however he had also changed a correct answer to an incorrect one and overlooked an additional error. SLP educated pt that his deficits in attention to detail and error awareness would negatively impact his job performance. SLP facilitated attention to detail task (hours worked); pt required min A to review for errors; he ID'd and corrected 4/4 errors with extended time. Pt to perform calculations with order forms at home and bring to next session.      Assessment / Recommendations / Plan   Plan  Continue with current plan of care      Progression Toward Goals   Progression toward goals  Progressing toward goals       SLP Education - 04/02/17 1013    Education provided  Yes    Education Details  decreased attention to detail, error awareness will impact him at work    [employer]) Educated  Patient    Methods  Explanation    Comprehension  Verbalized understanding         SLP Long Term Goals - 04/02/17 0947      SLP LONG TERM GOAL #1   Title  Pt will complete moderately complex time/money, reasoning/problem solving tasks with 90% accuracy    Time  6 renewed for 4 additional weeks  starting 04/10/17    Period  Weeks    Status  Revised      SLP LONG TERM GOAL #2   Time  6 renewed for 4 additional weeks starting 04/10/17    Period  Weeks    Status  Revised      SLP LONG TERM GOAL #3   Title  Pt will demo attention to detail necessary for 100% success in detailed linguistic tasks given self correction, over three sessions    Time  6 renewed for 4 additional weeks starting 04/10/17    Period  Weeks    Status  Revised       Plan - 04/02/17 0945    Clinical Impression Statement  [name] presents today with cont'd higher level cognitive deficits in reasoning, problem solving, organization, awareness, and attention to detail. Pt making slow steady progress; renewal completed for 4 additional weeks beginng 04/10/17. I recommend cont'd skilled ST to target the above impairments in order to  maxmize cognitive function for return to work.    Speech Therapy Frequency  2x / week    Duration  4 weeks recertification for additional 4 weeks beginning 04/10/17    Treatment/Interventions  Compensatory strategies;Patient/family education;SLP instruction and feedback;Functional tasks;Internal/external aids;Compensatory techniques;Cognitive reorganization    Potential to Achieve Goals  Good    Consulted and Agree with Plan of Care  Patient       Patient will benefit from skilled therapeutic intervention in order to improve the following deficits and impairments:   Cognitive communication deficit - Plan: SLP plan of care cert/re-cert    Problem List Patient Active Problem List   Diagnosis Date Noted  . Hyperglycemia   . Poor nutrition   . Gastroesophageal reflux disease   . Hyperlipidemia   . Morbid obesity (HCC)   . 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 Cryptogenic stroke (HCC) 02/12/2017  . Allergic rhinitis 11/07/2016  . BMI 39.0-39.9,adult 11/07/2016  . Essential hypertension 10/18/2016    Arlana LindauMary E Jorden Mahl 04/02/2017, 10:17 AM  [facility] [address] [place], [place], [address] Phone: [phone]   Fax:  [phone]   Name: [name] MRN: [id number] Date of Birth: 08-Apr-1965

## 2017-04-02 NOTE — Therapy (Signed)
[facility] [address] [place], [place], [address] Phone: [phone]   Fax:  [phone]  Occupational Therapy Treatment  Patient Details  Name: [name] MRN: [id number] Date of Birth: [birth year] Referring Provider: Dr. Posey Pronto   Encounter Date: 04/02/2017  OT End of Session - 04/02/17 0842    Visit Number  7    Number of Visits  17    Date for OT Re-Evaluation  05/02/17    Authorization Type  BC/BS    Authorization Time Period  90 combined    OT Start Time  0800    OT Stop Time  0845    OT Time Calculation (min)  45 min    Activity Tolerance  Patient tolerated treatment well    Behavior During Therapy  [facility] for tasks assessed/performed       Past Medical History:  Diagnosis Date  . Environmental allergies   . Fall from roof    with kidney trauma  . Hypertension 2018  . Stroke [facility])     Past Surgical History:  Procedure Laterality Date  . LOOP RECORDER INSERTION N/A 02/16/2017   Procedure: LOOP RECORDER INSERTION;  Surgeon: Constance Haw, MD;  Location: [place] CV LAB;  Service: Cardiovascular;  Laterality: N/A;  . NO PAST SURGERIES    . TEE WITHOUT CARDIOVERSION N/A 02/16/2017   Procedure: TRANSESOPHAGEAL ECHOCARDIOGRAM (TEE);  Surgeon: Jerline Pain, MD;  Location: [facility] ENDOSCOPY;  Service: Cardiovascular;  Laterality: N/A;    There were no vitals filed for this visit.  Subjective Assessment - 04/02/17 0800    Pertinent History  CVA 02/12/17, HTN     Limitations  **Loop recorder, no driving, no strenuous activtity    Patient Stated Goals  Get back to work, get back to driving    Currently in Pain?  No/denies                   OT Treatments/Exercises (OP) - 04/02/17 0001      Cognitive Exercises   Problem Solving  Deduction puzzle for problem solving/deductive reasoning with 100% accuracy. Pt given another deduction puzzle for homework    Organization  "Organizing Your  Day" task (complex #1): Pt listed all errands without omission, however did not perform 2 errands in the best order (decreased reasoning/problem solving) and did not allow enough time for 1 errand      Hand Exercises   Other Hand Exercises  Gripper set at level 3 resistance to pick up blocks Lt hand for sustained grip strength and graded pressure/proprioceptive feedback. Pt with mod to max drops/difficulty and 1 rest break required               OT Short Term Goals - 03/25/17 1338      OT SHORT TERM GOAL #1   Title  Independent with coordination and putty HEP     Time  4    Period  Weeks    Status  Achieved      OT SHORT TERM GOAL #2   Title  Pt to verbalize safety considerations with sensory loss Lt side    Time  4    Period  Weeks    Status  Achieved      OT SHORT TERM GOAL #3   Title  Pt to tie shoes mod I level with extra time as needed    Time  4    Period  Weeks    Status  Achieved      OT SHORT TERM GOAL #4   Title  Pt to divide attention between environmental scanning and physical or cognitive task with 90% accuracy    Time  4    Period  Weeks    Status  On-going      OT SHORT TERM GOAL #5   Title  Pt to improve grip strength Lt hand to 52 lbs average or higher    Baseline  47 lbs    Time  4    Period  Weeks    Status  On-going        OT Long Term Goals - 04/02/17 9935      OT LONG TERM GOAL #1   Title  Pt to be independent with theraband HEP for LUE    Time  8    Period  Weeks    Status  Achieved      OT LONG TERM GOAL #2   Title  Pt to improve coordination Lt hand as evidenced by reducing speed on 9 hole peg test to 40 sec. or less    Baseline  55.53 sec.     Time  8    Period  Weeks    Status  On-going      OT LONG TERM GOAL #3   Title  Pt to return to cooking full meal demo safety, organization, planning at mod I level with extra time as needed    Time  8    Period  Weeks    Status  Achieved per pt report      OT LONG TERM GOAL #4    Title  Pt to demo data entry and other computer tasks necessary to perform work related tasks with 100% accuracy    Time  8    Period  Weeks    Status  On-going            Plan - 04/02/17 7017    Clinical Impression Statement  Pt met LTG's #1 and #3. Pt with progress towards remaining goals    Occupational Profile and client history currently impacting functional performance  HTN, loop recorder placed. Current deficits limiting pt's ability to perform IADLS and work related tasks    Rehab Potential  Good    OT Frequency  2x / week    OT Duration  8 weeks    OT Treatment/Interventions  Self-care/ADL training;DME and/or AE instruction;Therapeutic activities;Therapeutic exercise;Neuromuscular education;Patient/family education;Cognitive remediation/compensation;Fluidtherapy;Functional Mobility Training    Plan  continue coordination Lt hand, divided attention tasks, check homework (deduction puzzle)     Consulted and Agree with Plan of Care  Patient       Patient will benefit from skilled therapeutic intervention in order to improve the following deficits and impairments:  Decreased coordination, Impaired sensation, Impaired UE functional use, Decreased strength, Decreased activity tolerance, Decreased cognition  Visit Diagnosis: Muscle weakness (generalized)  Other symptoms and signs involving cognitive functions following cerebral infarction    Problem List Patient Active Problem List   Diagnosis Date Noted  . Hyperglycemia   . Poor nutrition   . Gastroesophageal reflux disease   . Hyperlipidemia   . Morbid obesity ([place])   . Acute ischemic right MCA stroke ([place]) 02/17/2017  . Cryptogenic stroke ([place]) 02/12/2017  . Allergic rhinitis 11/07/2016  . BMI 39.0-39.9,adult 11/07/2016  . Essential hypertension 10/18/2016    Carey Bullocks, OTR/L 04/02/2017, 8:44 AM  [facility] [address]  [place],  [place], [address] Phone: [phone]   Fax:  [phone]  Name: [name] MRN: [id number] Date of Birth: December 09, 1965

## 2017-04-03 ENCOUNTER — Encounter: Payer: Self-pay | Admitting: Physician Assistant

## 2017-04-03 ENCOUNTER — Other Ambulatory Visit: Payer: Self-pay

## 2017-04-03 ENCOUNTER — Ambulatory Visit (INDEPENDENT_AMBULATORY_CARE_PROVIDER_SITE_OTHER): Payer: BLUE CROSS/BLUE SHIELD | Admitting: Physician Assistant

## 2017-04-03 VITALS — BP 118/80 | HR 104 | Temp 99.1°F | Resp 18 | Ht 71.0 in | Wt 262.2 lb

## 2017-04-03 DIAGNOSIS — R739 Hyperglycemia, unspecified: Secondary | ICD-10-CM | POA: Diagnosis not present

## 2017-04-03 DIAGNOSIS — E041 Nontoxic single thyroid nodule: Secondary | ICD-10-CM | POA: Diagnosis not present

## 2017-04-03 DIAGNOSIS — I69359 Hemiplegia and hemiparesis following cerebral infarction affecting unspecified side: Secondary | ICD-10-CM | POA: Diagnosis not present

## 2017-04-03 DIAGNOSIS — E785 Hyperlipidemia, unspecified: Secondary | ICD-10-CM | POA: Diagnosis not present

## 2017-04-03 DIAGNOSIS — I10 Essential (primary) hypertension: Secondary | ICD-10-CM | POA: Diagnosis not present

## 2017-04-03 DIAGNOSIS — I69328 Other speech and language deficits following cerebral infarction: Secondary | ICD-10-CM | POA: Diagnosis not present

## 2017-04-03 NOTE — Assessment & Plan Note (Signed)
2.3 cm solid nodule. FNA ordered, not yet scheduled.

## 2017-04-03 NOTE — Patient Instructions (Addendum)
1. My office and interventional radiology will call you. Please schedule the thyroid biopsy when they call. 2. Please call [phone] to schedule the colonoscopy.    IF you received an x-ray today, you will receive an invoice from [facility]. Please contact [facility] at [phone] with questions or concerns regarding your invoice.   IF you received labwork today, you will receive an invoice from [facility]. Please contact [facility] at [phone] with questions or concerns regarding your invoice.   Our billing staff will not be able to assist you with questions regarding bills from these companies.  You will be contacted with the lab results as soon as they are available. The fastest way to get your results is to activate your My Chart account. Instructions are located on the last page of this paperwork. If you have not heard from us regarding the results in 2 weeks, please contact this office.

## 2017-04-03 NOTE — Progress Notes (Signed)
Patient ID: [name], male    DOB: 07/12/1965, 52 y.o.   MRN: [id number]  PCP: Porfirio OarJeffery, Delora Gravatt, PA-C  Chief Complaint  Patient presents with  . Hypertension    Pt states have been checked at home and PT. Pt states BP have running 120/90.  . Stroke  . Follow-up    Subjective:   Presents for evaluation of HTN. He was hospitalized with cryptogenic stroke last month. Cardiology visit 1/21: wound check for loop recorder placement. Physical Medicine 2/01: Improving with PT/OT/ST Next visit 05/15/17 Neurology visit 04/27/2017 GI: not yet scheduled.   Has graduated from leg PT, as of yesterday. Continues increased exercise, working on healthier eating. Speech to continue for the next 4 weeks. Anticipates continued treatment for functional loss of the LEFT hand/arms. Has had improved sensation in the hand.  CT scan revealed thyroid nodules, US indicates need for FNA of the largest RIGHT mid nodule (2.3 cm, solid). That has been ordered, but not yet scheduled.  Glucose last month was 111, non-fasting. Lupus anticoagulant panel needs repeating 12+ weeks after initial elevated results (planned for patient's next visit here).  Review of Systems  Constitutional: Negative for chills and fever.  Respiratory: Negative for cough and shortness of breath.   Cardiovascular: Negative for chest pain, palpitations and leg swelling.  Gastrointestinal: Negative for diarrhea, nausea and vomiting.  Endocrine: Negative for polydipsia.  Genitourinary: Negative for dysuria, frequency and urgency.  Musculoskeletal: Negative for myalgias.  Skin: Negative for rash.  Neurological: Positive for speech difficulty (mild), weakness (LEFT hand-improving) and numbness (LEFT hand-improving). Negative for dizziness and headaches.       Patient Active Problem List   Diagnosis Date Noted  . Right thyroid nodule 04/02/2017  . Hyperglycemia   . Poor nutrition   . Gastroesophageal reflux disease   .  Hyperlipidemia   . Morbid obesity (HCC)   . 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 Cryptogenic stroke ([facility]) 02/12/2017  . Allergic rhinitis 11/07/2016  . BMI 39.0-39.9,adult 11/07/2016  . Essential hypertension 10/18/2016     Prior to Admission medications   Medication Sig Start Date End Date Taking? Authorizing Provider  acetaminophen (TYLENOL) 325 MG tablet Take 650 mg by mouth every 6 (six) hours as needed for mild pain.   Yes [provider]  amLODipine (NORVASC) 10 MG tablet Take 1 tablet (10 mg total) by mouth daily. 02/27/17  Yes Ivori Storr, PA-C  aspirin EC 325 MG EC tablet Take 1 tablet (325 mg total) by mouth daily. 02/26/17  Yes Love, Evlyn KannerPamela S, PA-C  atorvastatin (LIPITOR) 40 MG tablet Take 1 tablet (40 mg total) by mouth daily at 6 PM. 02/27/17  Yes Mialee Weyman, PA-C  cetirizine (ZYRTEC) 10 MG tablet Take 10 mg by mouth daily as needed for allergies.   Yes [provider]  famotidine (PEPCID) 20 MG tablet Take 1 tablet (20 mg total) by mouth daily. 02/27/17  Yes Bobbi Kozakiewicz, PA-C  lisinopril (PRINIVIL,ZESTRIL) 20 MG tablet Take 1 tablet (20 mg total) by mouth daily. 02/27/17  Yes Porfirio OarJeffery, Siearra Amberg, PA-C     Allergies  Allergen Reactions  . Penicillins Other (See Comments)    Headache Has patient had a PCN reaction causing immediate rash, facial/tongue/throat swelling, SOB or lightheadedness with hypotension: No Has patient had a PCN reaction causing severe rash involving mucus membranes or skin necrosis: No Has patient had a PCN reaction that required hospitalization: No Has patient had a PCN reaction occurring  within the last 10 years:No If all of the above answers are "NO", then may proceed with Cephalosporin use.        Objective:  Physical Exam  Constitutional: He is oriented to person, place, and time. He appears well-developed and well-nourished. He is active and cooperative. No distress.  BP 118/80 (BP Location: Right Arm,  Patient Position: Sitting, Cuff Size: Large)   Pulse (!) 104   Temp 99.1 F (37.3 C) (Oral)   Resp 18   Ht 5\' 11"  (1.803 m)   Wt 262 lb 3.2 oz (118.9 kg)   SpO2 100%   BMI 36.57 kg/m   HENT:  Head: Normocephalic and atraumatic.  Right Ear: Hearing normal.  Left Ear: Hearing normal.  Eyes: Conjunctivae are normal. No scleral icterus.  Neck: Normal range of motion. Neck supple. No thyromegaly present.  Cardiovascular: Normal rate, regular rhythm and normal heart sounds.  Pulses:      Radial pulses are 2+ on the right side, and 2+ on the left side.  Pulmonary/Chest: Effort normal and breath sounds normal.  Lymphadenopathy:       Head (right side): No tonsillar, no preauricular, no posterior auricular and no occipital adenopathy present.       Head (left side): No tonsillar, no preauricular, no posterior auricular and no occipital adenopathy present.    He has no cervical adenopathy.       Right: No supraclavicular adenopathy present.       Left: No supraclavicular adenopathy present.  Neurological: He is alert and oriented to person, place, and time. No sensory deficit.  Skin: Skin is warm, dry and intact. No rash noted. No cyanosis or erythema. Nails show no clubbing.  Psychiatric: He has a normal mood and affect. His speech is normal and behavior is normal.       Wt Readings from Last 3 Encounters:  04/03/17 262 lb 3.2 oz (118.9 kg)  02/27/17 269 lb 12.8 oz (122.4 kg)  02/25/17 263 lb 14.3 oz (119.7 kg)       Assessment & Plan:   Problem List Items Addressed This Visit    Essential hypertension    Well controlled. Continue amlodipine and lisinopril, healthier eating and increased physical activity.      Right thyroid nodule    2.3 cm solid nodule. FNA ordered, not yet scheduled.      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 Follow-up with neurology and physical medicine as planned. Repeat  Lupus panel and           Return in about 6 weeks (around 05/15/2017) for re-evalaution of blood pressure, fasting labs.   Fernande Bras, PA-C [facility]

## 2017-04-03 NOTE — Assessment & Plan Note (Signed)
Continue speech and PT. Follow-up with neurology and physical medicine as planned. Repeat Lupus panel and

## 2017-04-03 NOTE — Assessment & Plan Note (Signed)
Well controlled. Continue amlodipine and lisinopril, healthier eating and increased physical activity.

## 2017-04-08 ENCOUNTER — Ambulatory Visit: Payer: BLUE CROSS/BLUE SHIELD | Admitting: Occupational Therapy

## 2017-04-08 ENCOUNTER — Ambulatory Visit: Payer: BLUE CROSS/BLUE SHIELD | Admitting: Physical Therapy

## 2017-04-08 ENCOUNTER — Ambulatory Visit: Payer: BLUE CROSS/BLUE SHIELD

## 2017-04-08 DIAGNOSIS — R278 Other lack of coordination: Secondary | ICD-10-CM | POA: Diagnosis not present

## 2017-04-08 DIAGNOSIS — R41841 Cognitive communication deficit: Secondary | ICD-10-CM

## 2017-04-08 NOTE — Patient Instructions (Signed)
  Please complete the assigned speech therapy homework prior to your next session and return it to the speech therapist at your next visit.  

## 2017-04-08 NOTE — Therapy (Signed)
[facility] [address] [place], [place], [address] Phone: [phone]   Fax:  [phone]  Speech Language Pathology Treatment  Patient Details  Name: [name] MRN: [id number] Date of Birth: 08-Aug-1965 Referring Provider: Dr. Maryla MorrowAnkit Patel   Encounter Date: 04/08/2017  End of Session - 04/08/17 1238    Visit Number  8    Number of Visits  17    Date for SLP Re-Evaluation  05/08/17    SLP Start Time  1147    SLP Stop Time   1230    SLP Time Calculation (min)  43 min       Past Medical History:  Diagnosis Date  . 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 Environmental allergies   . Fall from roof    with kidney trauma  . Hypertension 2018  . Stroke [facility](HCC)     Past Surgical History:  Procedure Laterality Date  . LOOP RECORDER INSERTION N/A 02/16/2017   Procedure: LOOP RECORDER INSERTION;  Surgeon: Regan Lemmingamnitz, Will Martin, MD;  Location: [facility] IN[facility]AS[facility] CV LAB;  Service: Cardiovascular;  Laterality: N/A;  . NO PAST SURGERIES    . TEE WITHOUT CARDIOVERSION N/A 02/16/2017   Procedure: TRANSESOPHAGEAL ECHOCARDIOGRAM (TEE);  Surgeon: Jake BatheSkains, Mark C, MD;  [facility];  Service: Cardiovascular;  Laterality: N/A;    There were no vitals filed for this visit.  Subjective Assessment - 04/08/17 1206    Currently in Pain?  Yes    Pain Score  2     Pain Location  Hand    Pain Orientation  Left pinky and ring fingers    Pain Descriptors / Indicators  Numbness    Pain Type  Acute pain    Pain Onset  1 to 4 weeks ago    Pain Frequency  Intermittent            ADULT SLP TREATMENT - 04/08/17 1206      General Information   Behavior/Cognition  Alert;Cooperative;Pleasant mood      Treatment Provided   Treatment provided  Cognitive-Linquistic      Cognitive-Linquistic Treatment   Treatment focused on  Cognition    Skilled Treatment  SLP targeted pt's attention/attention to detail with  functional math tasks today, pt was told to ensure answers were correct before telling SLP to check answers. Pt 80% success (4/5) on first task and 80% (4/5) on second task.       Assessment / Recommendations / Plan   Plan  Continue with current plan of care      Progression Toward Goals   Progression toward goals  Progressing toward goals       SLP Education - 04/08/17 1238    Education provided  Yes    Education Details  decreased attention to detail, need to double check answers    Person(s) Educated  Patient    Methods  Explanation    Comprehension  Verbalized understanding         SLP Long Term Goals - 04/08/17 1243      SLP LONG TERM GOAL #1   Title  Pt will complete moderately complex time/money, reasoning/problem solving tasks with 90% accuracy    Time  6 renewed for 4 additional weeks starting 04/10/17    Period  Weeks    Status  On-going      SLP LONG TERM GOAL #2   Time  6 renewed for 4 additional weeks starting 04/10/17  Period  Weeks    Status  On-going      SLP LONG TERM GOAL #3   Title  Pt will demo attention to detail necessary for 100% success in detailed linguistic tasks given self correction, over three sessions    Time  6 renewed for 4 additional weeks starting 04/10/17    Period  Weeks    Status  On-going       Plan - 04/08/17 1242    Clinical Impression Statement  Higher level cognitive deficits in reasoning, problem solving, organization, awareness, and attention to detail. Pt cont to make slow steady progress however attention to detail was highlighted in pt's session today as deficient. Cont'd skilled ST is recommended to target the above impairments in order to maxmize cognitive function for return to work.    Speech Therapy Frequency  2x / week    Duration  4 weeks recertification for additional 4 weeks beginning 04/10/17    Treatment/Interventions  Compensatory strategies;Patient/family education;SLP instruction and feedback;Functional  tasks;Internal/external aids;Compensatory techniques;Cognitive reorganization    Potential to Achieve Goals  Good    Consulted and Agree with Plan of Care  Patient       Patient will benefit from skilled therapeutic intervention in order to improve the following deficits and impairments:   Cognitive communication deficit    Problem List Patient Active Problem List   Diagnosis Date Noted  . 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 Right thyroid nodule 04/02/2017  . Hyperglycemia   . Poor nutrition   . Gastroesophageal reflux disease   . Hyperlipidemia   . Morbid obesity (HCC)   . History of stroke 02/12/2017  . Allergic rhinitis 11/07/2016  . BMI 39.0-39.9,adult 11/07/2016  . Essential hypertension 10/18/2016    [facility] ,MS, CCC-SLP  04/08/2017, 12:43 PM   [facility] [address] [place], [place], [address] Phone: [phone]   Fax:  [phone]   Name: [name] MRN: [id number] Date of Birth: Jun 17, 1965

## 2017-04-10 ENCOUNTER — Ambulatory Visit: Payer: BLUE CROSS/BLUE SHIELD

## 2017-04-10 ENCOUNTER — Ambulatory Visit: Payer: BLUE CROSS/BLUE SHIELD | Attending: Physical Medicine & Rehabilitation | Admitting: Occupational Therapy

## 2017-04-10 ENCOUNTER — Ambulatory Visit: Payer: BLUE CROSS/BLUE SHIELD | Admitting: Physician Assistant

## 2017-04-10 DIAGNOSIS — R208 Other disturbances of skin sensation: Secondary | ICD-10-CM

## 2017-04-10 DIAGNOSIS — I69954 Hemiplegia and hemiparesis following unspecified cerebrovascular disease affecting left non-dominant side: Secondary | ICD-10-CM

## 2017-04-10 DIAGNOSIS — I69318 Other symptoms and signs involving cognitive functions following cerebral infarction: Secondary | ICD-10-CM

## 2017-04-10 DIAGNOSIS — R41841 Cognitive communication deficit: Secondary | ICD-10-CM | POA: Diagnosis present

## 2017-04-10 DIAGNOSIS — R278 Other lack of coordination: Secondary | ICD-10-CM | POA: Diagnosis present

## 2017-04-10 DIAGNOSIS — M6281 Muscle weakness (generalized): Secondary | ICD-10-CM | POA: Diagnosis not present

## 2017-04-10 LAB — CUP PACEART REMOTE DEVICE CHECK
Date Time Interrogation Session: 20190206153836
Implantable Pulse Generator Implant Date: 20190107

## 2017-04-10 NOTE — Therapy (Signed)
[facility] [address] [place], [place], [address] Phone: [phone]   Fax:  [phone]  Speech Language Pathology Treatment  Patient Details  Name: [name] MRN: [id number] Date of Birth: 10-06-1965 Referring Provider: Dr. Maryla Morrow   Encounter Date: 04/10/2017  End of Session - 04/10/17 1421    Visit Number  9    Number of Visits  17    Date for SLP Re-Evaluation  05/08/17    SLP Start Time  1103    SLP Stop Time   1145    SLP Time Calculation (min)  42 min    Activity Tolerance  Patient tolerated treatment well       Past Medical History:  Diagnosis Date  . 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 Environmental allergies   . Fall from roof    with kidney trauma  . Hypertension 2018  . Stroke [facility])     Past Surgical History:  Procedure Laterality Date  . LOOP RECORDER INSERTION N/A 02/16/2017   Procedure: LOOP RECORDER INSERTION;  Surgeon: Regan Lemming, MD;  [facility]: [facility];  Service: Cardiovascular;  Laterality: N/A;  . NO PAST SURGERIES    . TEE WITHOUT CARDIOVERSION N/A 02/16/2017   Procedure: TRANSESOPHAGEAL ECHOCARDIOGRAM (TEE);  Surgeon: Jake Bathe, MD;  Location: [facility] ENDOSCOPY;  Service: Cardiovascular;  Laterality: N/A;    There were no vitals filed for this visit.  Subjective Assessment - 04/10/17 1141    Subjective  Pt got homework for SLP upon entering the room.    Currently in Pain?  No/denies            ADULT SLP TREATMENT - 04/10/17 1142      General Information   Behavior/Cognition  Alert;Cooperative;Pleasant mood      Treatment Provided   Treatment provided  Cognitive-Linquistic      Cognitive-Linquistic Treatment   Treatment focused on  Cognition    Skilled Treatment  Pt's deficits in attention and attention to detail were the focus of ST today with SLP in written and verbal tasks. On written task, pt req'd rare cues back to task,  in the last 4-5 minutes of the word puzzle (puzzle took approx 15 minutes) when pt had to reason and problem solve how to obtain the correct response.  Pt was very verbose between tasks. ? pt's personality vs. attention deficit.       Assessment / Recommendations / Plan   Plan  Continue with current plan of care           SLP Long Term Goals - 04/10/17 1421      SLP LONG TERM GOAL #1   Title  Pt will complete moderately complex time/money, reasoning/problem solving tasks with 90% accuracy    Time  6 renewed for 4 additional weeks starting 04/10/17    Period  Weeks    Status  On-going      SLP LONG TERM GOAL #2   Title  Pt will identify and correct errors in cognitive linguistic tasks in 80% of opportunities.     Time  6 renewed for 4 additional weeks starting 04/10/17    Period  Weeks    Status  On-going      SLP LONG TERM GOAL #3   Title  Pt will demo attention to detail necessary for 100% success in detailed linguistic tasks given self correction, over three sessions    Time  6 renewed  for 4 additional weeks starting 04/10/17    Period  Weeks    Status  On-going       Plan - 04/10/17 1421    Clinical Impression Statement  Higher level cognitive deficits in reasoning, problem solving, organization, awareness, and attention to detail. Pt cont to make slow steady progress however attention to detail was highlighted in pt's session today as deficient. Cont'd skilled ST is recommended to target the above impairments in order to maxmize cognitive function for return to work.    Speech Therapy Frequency  2x / week    Duration  4 weeks recertification for additional 4 weeks beginning 04/10/17    Treatment/Interventions  Compensatory strategies;Patient/family education;SLP instruction and feedback;Functional tasks;Internal/external aids;Compensatory techniques;Cognitive reorganization    Potential to Achieve Goals  Good    Consulted and Agree with Plan of Care  Patient       Patient will  benefit from skilled therapeutic intervention in order to improve the following deficits and impairments:   Cognitive communication deficit    Problem List Patient Active Problem List   Diagnosis Date Noted  . 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 Right thyroid nodule 04/02/2017  . Hyperglycemia   . Poor nutrition   . Gastroesophageal reflux disease   . Hyperlipidemia   . Morbid obesity (HCC)   . History of stroke 02/12/2017  . Allergic rhinitis 11/07/2016  . BMI 39.0-39.9,adult 11/07/2016  . Essential hypertension 10/18/2016    [facility],Rane Dumm ,MS, CCC-SLP  04/10/2017, 2:22 PM  [facility] [address] [place], [place], [address] Phone: [phone]   Fax:  [phone]   Name: [name] MRN: [id number] Date of Birth: [birth year]

## 2017-04-10 NOTE — Therapy (Signed)
[facility] [address] [place], [place], [address] Phone: [phone]   Fax:  [phone]  Occupational Therapy Treatment  Patient Details  Name: Jesus IlesChester Dilley Li MRN: [id number] Date of Birth: [birth year] Referring Provider: Dr. Allena KatzPatel   Encounter Date: 04/10/2017  OT End of Session - 04/10/17 1152    Visit Number  8    Number of Visits  17    Date for OT Re-Evaluation  05/02/17    Authorization Type  BC/BS    Authorization Time Period  90 combined    OT Start Time  1150    OT Stop Time  1230    OT Time Calculation (min)  40 min    Activity Tolerance  Patient tolerated treatment well    Behavior During Therapy  [facility] for tasks assessed/performed       Past Medical History:  Diagnosis Date  . 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 Environmental allergies   . Fall from roof    with kidney trauma  . Hypertension 2018  . Stroke [facility](HCC)     Past Surgical History:  Procedure Laterality Date  . LOOP RECORDER INSERTION N/A 02/16/2017   Procedure: LOOP RECORDER INSERTION;  Surgeon: Regan Lemmingamnitz, Will Martin, MD;  [facility] LAB;  Service: Cardiovascular;  Laterality: N/A;  . NO PAST SURGERIES    . TEE WITHOUT CARDIOVERSION N/A 02/16/2017   Procedure: TRANSESOPHAGEAL ECHOCARDIOGRAM (TEE);  Surgeon: Jake BatheSkains, Mark C, MD;  [facility];  Service: Cardiovascular;  Laterality: N/A;    There were no vitals filed for this visit.  Subjective Assessment - 04/10/17 1151    Pertinent History  CVA 02/12/17, HTN     Limitations  **Loop recorder, no driving, no strenuous activtity    Patient Stated Goals  Get back to work, get back to driving    Currently in Pain?  No/denies                Treatment: Copying small peg design with LUE while performing in hand manipulation to hold several pegs in hand while placing pegs 1 at a time, min difficulty/ drops, then removing with in hand  manipulation. Divided attention task, ambulating, tossing ball and performing category generation, min difficulty/ v.c  Gripper set at level 2 to stack blocks using LUE min difficulty several rest breaks then level 3 to pick up singel blocks, fatigue and several rest breaks required.             OT Short Term Goals - 03/25/17 1338      OT SHORT TERM GOAL #1   Title  Independent with coordination and putty HEP     Time  4    Period  Weeks    Status  Achieved      OT SHORT TERM GOAL #2   Title  Pt to verbalize safety considerations with sensory loss Lt side    Time  4    Period  Weeks    Status  Achieved      OT SHORT TERM GOAL #3   Title  Pt to tie shoes mod I level with extra time as needed    Time  4    Period  Weeks    Status  Achieved      OT SHORT TERM GOAL #4   Title  Pt to divide attention between environmental scanning and physical or cognitive task with 90% accuracy    Time  4  Period  Weeks    Status  On-going      OT SHORT TERM GOAL #5   Title  Pt to improve grip strength Lt hand to 52 lbs average or higher    Baseline  47 lbs    Time  4    Period  Weeks    Status  On-going        OT Long Term Goals - 04/02/17 1610      OT LONG TERM GOAL #1   Title  Pt to be independent with theraband HEP for LUE    Time  8    Period  Weeks    Status  Achieved      OT LONG TERM GOAL #2   Title  Pt to improve coordination Lt hand as evidenced by reducing speed on 9 hole peg test to 40 sec. or less    Baseline  55.53 sec.     Time  8    Period  Weeks    Status  On-going      OT LONG TERM GOAL #3   Title  Pt to return to cooking full meal demo safety, organization, planning at mod I level with extra time as needed    Time  8    Period  Weeks    Status  Achieved per pt report      OT LONG TERM GOAL #4   Title  Pt to demo data entry and other computer tasks necessary to perform work related tasks with 100% accuracy    Time  8    Period  Weeks     Status  On-going            Plan - 04/10/17 1157    Clinical Impression Statement  Pt is progressing towards goals for LUE fine motor coordination.    Rehab Potential  Good    OT Frequency  2x / week    OT Duration  8 weeks    OT Treatment/Interventions  Self-care/ADL training;DME and/or AE instruction;Therapeutic activities;Therapeutic exercise;Neuromuscular education;Patient/family education;Cognitive remediation/compensation;Fluidtherapy;Functional Mobility Training    Plan  Continue to address divided attention, work towards remaining goals       Patient will benefit from skilled therapeutic intervention in order to improve the following deficits and impairments:  Decreased coordination, Impaired sensation, Impaired UE functional use, Decreased strength, Decreased activity tolerance, Decreased cognition  Visit Diagnosis: Muscle weakness (generalized)  Hemiplegia and 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 Right thyroid nodule 04/02/2017  . Hyperglycemia   . Poor nutrition   . Gastroesophageal reflux disease   . Hyperlipidemia   . Morbid obesity (HCC)   . History of stroke 02/12/2017  . Allergic rhinitis 11/07/2016  . BMI 39.0-39.9,adult 11/07/2016  . Essential hypertension 10/18/2016    Doc Mandala 04/10/2017, 12:11 PM  [facility] [address] [place], [place], [address] Phone: [phone]   Fax:  [phone]  Name: [name] MRN: [id number] Date of Birth: 12/21/1965

## 2017-04-10 NOTE — Patient Instructions (Signed)
  Please complete the assigned speech therapy homework prior to your next session and return it to the speech therapist at your next visit.  

## 2017-04-13 ENCOUNTER — Ambulatory Visit: Payer: BLUE CROSS/BLUE SHIELD | Admitting: Occupational Therapy

## 2017-04-13 ENCOUNTER — Ambulatory Visit: Payer: BLUE CROSS/BLUE SHIELD | Admitting: Speech Pathology

## 2017-04-13 DIAGNOSIS — I69318 Other symptoms and signs involving cognitive functions following cerebral infarction: Secondary | ICD-10-CM

## 2017-04-13 DIAGNOSIS — M6281 Muscle weakness (generalized): Secondary | ICD-10-CM | POA: Diagnosis not present

## 2017-04-13 DIAGNOSIS — R41841 Cognitive communication deficit: Secondary | ICD-10-CM

## 2017-04-13 DIAGNOSIS — R278 Other lack of coordination: Secondary | ICD-10-CM

## 2017-04-13 NOTE — Therapy (Signed)
[facility] [address] [place], [place], [address] Phone: [phone]   Fax:  [phone]  Occupational Therapy Treatment  Patient Details  Name: [name] MRN: [id number] Date of Birth: 1965/12/12 Referring Provider: Dr. Allena Katz   Encounter Date: 04/13/2017  OT End of Session - 04/13/17 0928    Visit Number  9    Number of Visits  17    Date for OT Re-Evaluation  05/02/17    Authorization Type  BC/BS    Authorization Time Period  90 combined    OT Start Time  0845    OT Stop Time  0925    OT Time Calculation (min)  40 min    Activity Tolerance  Patient tolerated treatment well    Behavior During Therapy  [facility] for tasks assessed/performed       Past Medical History:  Diagnosis Date  . 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 Environmental allergies   . Fall from roof    with kidney trauma  . Hypertension 2018  . Stroke [facility])     Past Surgical History:  Procedure Laterality Date  . LOOP RECORDER INSERTION N/A 02/16/2017   Procedure: LOOP RECORDER INSERTION;  Surgeon: Regan Lemming, MD;  [facility]: [facility] INVAS[facility] CV LAB;  Service: Cardiovascular;  Laterality: N/A;  . NO PAST SURGERIES    . TEE WITHOUT CARDIOVERSION N/A 02/16/2017   Procedure: TRANSESOPHAGEAL ECHOCARDIOGRAM (TEE);  Surgeon: Jake Bathe, MD;  Location: [facility] ENDOSCOPY;  Service: Cardiovascular;  Laterality: N/A;    There were no vitals filed for this visit.  Subjective Assessment - 04/13/17 0850    Pertinent History  CVA 02/12/17, HTN     Limitations  **Loop recorder, no driving, no strenuous activtity    Patient Stated Goals  Get back to work, get back to driving    Currently in Pain?  No/denies         [facility] OT Assessment - 04/13/17 0001      Coordination   Left 9 Hole Peg Test  41.75 sec      Hand Function   Left Hand Grip (lbs)  68, 55, 65 lbs               OT Treatments/Exercises (OP) - 04/13/17 0001       ADLs   ADL Comments  Assessed remaining STG's and progress towards LTG for coordination - see assessment and goal section      Hand Exercises   Other Hand Exercises  Gripper set at level 3 resistance to pick up blocks Lt hand for sustained grip strength and graded pressure/proprioceptive feedback. Pt with mod to max drops/difficulty and 1 rest break required    Other Hand Exercises  Coordination: placing grooved pegs in pegboard Lt hand with min difficulty, then removing with tweezers. Manipulating up to 5 small pegs at a time (fingertip to/from palm translation) in Lt hand to place in pegboard (3 rows) with mod drops/difficulty      Visual/Perceptual Exercises   Scanning - Environmental  Pt performing environmental scanning while performing category generation at 100% accuracy               OT Short Term Goals - 04/13/17 0906      OT SHORT TERM GOAL #1   Title  Independent with coordination and putty HEP     Time  4    Period  Weeks    Status  Achieved  OT SHORT TERM GOAL #2   Title  Pt to verbalize safety considerations with sensory loss Lt side    Time  4    Period  Weeks    Status  Achieved      OT SHORT TERM GOAL #3   Title  Pt to tie shoes mod I level with extra time as needed    Time  4    Period  Weeks    Status  Achieved      OT SHORT TERM GOAL #4   Title  Pt to divide attention between environmental scanning and physical or cognitive task with 90% accuracy    Time  4    Period  Weeks    Status  Achieved 04/13/17: 100%      OT SHORT TERM GOAL #5   Title  Pt to improve grip strength Lt hand to 52 lbs average or higher    Baseline  47 lbs    Time  4    Period  Weeks    Status  Achieved 04/13/17: 68, 55, 65 lbs        OT Long Term Goals - 04/13/17 0906      OT LONG TERM GOAL #1   Title  Pt to be independent with theraband HEP for LUE    Time  8    Period  Weeks    Status  Achieved      OT LONG TERM GOAL #2   Title  Pt to improve  coordination Lt hand as evidenced by reducing speed on 9 hole peg test to 40 sec. or less    Baseline  55.53 sec.     Time  8    Period  Weeks    Status  On-going 04/13/17: 41.75 sec.       OT LONG TERM GOAL #3   Title  Pt to return to cooking full meal demo safety, organization, planning at mod I level with extra time as needed    Time  8    Period  Weeks    Status  Achieved per pt report      OT LONG TERM GOAL #4   Title  Pt to demo data entry and other computer tasks necessary to perform work related tasks with 100% accuracy    Time  8    Period  Weeks    Status  On-going            Plan - 04/13/17 0907    Clinical Impression Statement  Pt is progressing towards goals for LUE fine motor coordination. Pt with improved grip strength Lt hand    Occupational Profile and client history currently impacting functional performance  HTN, loop recorder placed. Current deficits limiting pt's ability to perform IADLS and work related tasks    Rehab Potential  Good    OT Frequency  2x / week    OT Duration  8 weeks    OT Treatment/Interventions  Self-care/ADL training;DME and/or AE instruction;Therapeutic activities;Therapeutic exercise;Neuromuscular education;Patient/family education;Cognitive remediation/compensation;Fluidtherapy;Functional Mobility Training    Plan  Continue to address divided attention, work towards remaining goals, anticipate d/c by end of next week (04/22/17)    Consulted and Agree with Plan of Care  Patient       Patient will benefit from skilled therapeutic intervention in order to improve the following deficits and impairments:  Decreased coordination, Impaired sensation, Impaired UE functional use, Decreased strength, Decreased activity tolerance, Decreased cognition  Visit Diagnosis: Muscle weakness (  generalized)  Other lack of coordination  Other symptoms and signs involving cognitive functions following cerebral infarction    Problem List Patient  Active Problem List   Diagnosis Date Noted  . 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 Right thyroid nodule 04/02/2017  . Hyperglycemia   . Poor nutrition   . Gastroesophageal reflux disease   . Hyperlipidemia   . Morbid obesity (HCC)   . History of stroke 02/12/2017  . Allergic rhinitis 11/07/2016  . BMI 39.0-39.9,adult 11/07/2016  . Essential hypertension 10/18/2016    Kelli ChurnBallie, Oyuki Hogan Johnson, OTR/L 04/13/2017, 9:29 AM  [facility] [address] [place], [place], [address] Phone: [phone]   Fax:  [phone]  Name: [name] MRN: [id number] Date of Birth: 11/23/1965

## 2017-04-13 NOTE — Therapy (Signed)
[facility] [address] [place], [place], [address] Phone: [phone]   Fax:  [phone]  Speech Language Pathology Treatment  Patient Details  Name: [name] MRN: [id number] Date of Birth: 29-Jul-1965 Referring Provider: Dr. Maryla MorrowAnkit Patel   Encounter Date: 04/13/2017  End of Session - 04/13/17 1002    Visit Number  10    Number of Visits  17    Date for SLP Re-Evaluation  05/08/17    SLP Start Time  0800    SLP Stop Time   0844    SLP Time Calculation (min)  44 min    Activity Tolerance  Patient tolerated treatment well       Past Medical History:  Diagnosis Date  . 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 Environmental allergies   . Fall from roof    with kidney trauma  . Hypertension 2018  . Stroke [facility](HCC)     Past Surgical History:  Procedure Laterality Date  . LOOP RECORDER INSERTION N/A 02/16/2017   Procedure: LOOP RECORDER INSERTION;  Surgeon: Regan Lemmingamnitz, Will Martin, MD;  [facility]: [facility] INVAS[facility] CV LAB;  Service: Cardiovascular;  Laterality: N/A;  . NO PAST SURGERIES    . TEE WITHOUT CARDIOVERSION N/A 02/16/2017   Procedure: TRANSESOPHAGEAL ECHOCARDIOGRAM (TEE);  Surgeon: Jake BatheSkains, Mark C, MD;  [facility];  Service: Cardiovascular;  Laterality: N/A;    There were no vitals filed for this visit.  Subjective Assessment - 04/13/17 0802    Subjective  "I'd still like to get back to driving."    Currently in Pain?  No/denies            ADULT SLP TREATMENT - 04/13/17 0802      General Information   Behavior/Cognition  Alert;Cooperative;Pleasant mood    Patient Positioning  Upright in chair      Treatment Provided   Treatment provided  Cognitive-Linquistic      Pain Assessment   Pain Assessment  No/denies pain      Cognitive-Linquistic Treatment   Treatment focused on  Cognition    Skilled Treatment  Pt showed SLP his homework, reported double checking his work, 100%  accuracy. Pt told SLP he hopes to get back to driving soon. SLP educated re: deficits in attention, and that [facility] divided attention is needed for safe driving. SLP engaged pt in divided attention activity (card counting with multiple variables). Pt accuracy was 76%; he was aware of and self-corrected ~40% of errors, required cues for error awareness and correction in remaining 60% of errors. SLP targeted attention to detail in written task with word puzzle; pt worked on the task for 18 minutes without verbosity or need for redirection. When he completed the puzzle, pt demo'd awareness of errors, stating, "something's not right here." He required mod cues to systematically review his work to identify mistakes.      Assessment / Recommendations / Plan   Plan  Continue with current plan of care      Progression Toward Goals   Progression toward goals  Progressing toward goals       SLP Education - 04/13/17 1002    Education provided  Yes    Education Details  do not recommend driving at this time due to impaired attention    Person(s) Educated  Patient    Methods  Explanation;Demonstration    Comprehension  Verbalized understanding         SLP Long Term Goals - 04/13/17  1004      SLP LONG TERM GOAL #1   Title  Pt will complete moderately complex time/money, reasoning/problem solving tasks with 90% accuracy    Time  5    Period  Weeks    Status  On-going      SLP LONG TERM GOAL #2   Title  Pt will identify and correct errors in cognitive linguistic tasks in 80% of opportunities.     Time  5    Period  Weeks    Status  On-going      SLP LONG TERM GOAL #3   Title  Pt will demo attention to detail necessary for 100% success in detailed linguistic tasks given self correction, over three sessions    Time  5    Period  Weeks    Status  On-going       Plan - 04/13/17 1002    Clinical Impression Statement  Higher level cognitive deficits in reasoning, problem solving, organization,  awareness, and attention to detail. Pt cont to make slow steady progress however divided attention and attention to detail were highlighted in pt's session today as deficient. No cues for verbosity required today. Cont'd skilled ST is recommended to target the above impairments in order to maxmize cognitive function for return to work.    Speech Therapy Frequency  2x / week    Duration  4 weeks    Treatment/Interventions  Compensatory strategies;Patient/family education;SLP instruction and feedback;Functional tasks;Internal/external aids;Compensatory techniques;Cognitive reorganization    Potential to Achieve Goals  Good    Consulted and Agree with Plan of Care  Patient       Patient will benefit from skilled therapeutic intervention in order to improve the following deficits and impairments:   Cognitive communication deficit    Problem List Patient Active Problem List   Diagnosis Date Noted  . 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 Right thyroid nodule 04/02/2017  . Hyperglycemia   . Poor nutrition   . Gastroesophageal reflux disease   . Hyperlipidemia   . Morbid obesity (HCC)   . History of stroke 02/12/2017  . Allergic rhinitis 11/07/2016  . BMI 39.0-39.9,adult 11/07/2016  . Essential hypertension 10/18/2016   Rondel Baton, MS, CCC-SLP Speech-Language Pathologist   [name] 04/13/2017, 10:04 AM  [facility] [address] [place], [place], [address] Phone: [phone]   Fax:  [phone]   Name: [name] MRN: [id number] Date of Birth: 1965/02/18

## 2017-04-14 ENCOUNTER — Ambulatory Visit
Admission: RE | Admit: 2017-04-14 | Discharge: 2017-04-14 | Disposition: A | Discharge status: Home / Self Care | Payer: BLUE CROSS/BLUE SHIELD | Source: Ambulatory Visit | Attending: Physician Assistant | Admitting: Physician Assistant

## 2017-04-14 ENCOUNTER — Other Ambulatory Visit (HOSPITAL_COMMUNITY)
Admission: RE | Admit: 2017-04-14 | Discharge: 2017-04-14 | Disposition: A | Discharge status: Home / Self Care | Payer: BLUE CROSS/BLUE SHIELD | Source: Ambulatory Visit | Attending: Physician Assistant | Admitting: Physician Assistant

## 2017-04-14 DIAGNOSIS — E041 Nontoxic single thyroid nodule: Secondary | ICD-10-CM | POA: Diagnosis not present

## 2017-04-14 NOTE — Procedures (Signed)
PROCEDURE SUMMARY:  Using direct ultrasound guidance, 4 passes were made using 25 g needles into the nodule within the right lobe of the thyroid.   Ultrasound was used to confirm needle placements on all occasions.   Specimens were sent to Pathology for analysis.  See procedure note under Imaging tab in Epic for full procedure details.  Hera Celaya S Zyad Boomer PA-C 04/14/2017 4:24 PM

## 2017-04-16 ENCOUNTER — Ambulatory Visit: Payer: BLUE CROSS/BLUE SHIELD

## 2017-04-16 DIAGNOSIS — M6281 Muscle weakness (generalized): Secondary | ICD-10-CM | POA: Diagnosis not present

## 2017-04-16 DIAGNOSIS — R41841 Cognitive communication deficit: Secondary | ICD-10-CM

## 2017-04-16 NOTE — Patient Instructions (Signed)
  Please complete the assigned speech therapy homework prior to your next session and return it to the speech therapist at your next visit.  

## 2017-04-16 NOTE — Therapy (Signed)
[facility] [address] [place], [place], [address] Phone: [phone]   Fax:  [phone]  Speech Language Pathology Treatment  Patient Details  Name: [name] MRN: [id number] Date of Birth: [birth year] Referring Provider: Dr. Maryla MorrowAnkit Patel   Encounter Date: 04/16/2017  End of Session - 04/16/17 1048    Visit Number  11    Number of Visits  17    Date for SLP Re-Evaluation  05/08/17    SLP Start Time  0803    SLP Stop Time   0845    SLP Time Calculation (min)  42 min       Past Medical History:  Diagnosis Date  . 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 Environmental allergies   . Fall from roof    with kidney trauma  . Hypertension 2018  . Stroke [facility](HCC)     Past Surgical History:  Procedure Laterality Date  . LOOP RECORDER INSERTION N/A 02/16/2017   Procedure: LOOP RECORDER INSERTION;  Surgeon: Regan Lemmingamnitz, Will Martin, MD;  [facility] INV[facility] CV LAB;  Service: Cardiovascular;  Laterality: N/A;  . NO PAST SURGERIES    . TEE WITHOUT CARDIOVERSION N/A 02/16/2017   Procedure: TRANSESOPHAGEAL ECHOCARDIOGRAM (TEE);  Surgeon: Jake BatheSkains, Mark C, MD;  [facility];  Service: Cardiovascular;  Laterality: N/A;    There were no vitals filed for this visit.  Subjective Assessment - 04/16/17 0816    Subjective  Pt got out homework when SLP asked.    Currently in Pain?  No/denies            ADULT SLP TREATMENT - 04/16/17 0818      General Information   Behavior/Cognition  Alert;Cooperative;Pleasant mood      Treatment Provided   Treatment provided  Cognitive-Linquistic      Cognitive-Linquistic Treatment   Treatment focused on  Cognition    Skilled Treatment  Pt had one response he forgot to cross off his homework. In detailed instructions to work on attention to detail, In other detailed tasks pt req'd cues to double check responses but by third task he spontaneously rechecked answers.  Accuracy overall was 85% with all tasks on first completion, after corrections, pt 93% accurate.      Assessment / Recommendations / Plan   Plan  Continue with current plan of care      Progression Toward Goals   Progression toward goals  Progressing toward goals           SLP Long Term Goals - 04/16/17 1049      SLP LONG TERM GOAL #1   Title  Pt will complete moderately complex time/money, reasoning/problem solving tasks with 90% accuracy    Time  5    Period  Weeks    Status  On-going      SLP LONG TERM GOAL #2   Title  Pt will identify and correct errors in cognitive linguistic tasks in 80% of opportunities.     Time  5    Period  Weeks    Status  On-going      SLP LONG TERM GOAL #3   Title  Pt will demo attention to detail necessary for 100% success in detailed linguistic tasks given self correction, over three sessions    Time  5    Period  Weeks    Status  On-going       Plan - 04/16/17 1048    Clinical Impression Statement  Higher level cognitive deficits in reasoning, problem solving, organization, awareness, and attention to detail. Pt with spontaneous double checking of answers today only after SLP cues on two previous tasks. Pt cont to make slow steady progress however divided attention and attention to detail were highlighted in pt's session today as deficient. No cues for verbosity required today. Cont'd skilled ST is recommended to target the above impairments in order to maxmize cognitive function for return to work.    Speech Therapy Frequency  2x / week    Duration  4 weeks    Treatment/Interventions  Compensatory strategies;Patient/family education;SLP instruction and feedback;Functional tasks;Internal/external aids;Compensatory techniques;Cognitive reorganization    Potential to Achieve Goals  Good       Patient will benefit from skilled therapeutic intervention in order to improve the following deficits and impairments:   Cognitive communication  deficit    Problem List Patient Active Problem List   Diagnosis Date Noted  . 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 Right thyroid nodule 04/02/2017  . Hyperglycemia   . Poor nutrition   . Gastroesophageal reflux disease   . Hyperlipidemia   . Morbid obesity (HCC)   . History of stroke 02/12/2017  . Allergic rhinitis 11/07/2016  . BMI 39.0-39.9,adult 11/07/2016  . Essential hypertension 10/18/2016    [facility] ,MS, CCC-SLP  04/16/2017, 10:50 AM  [facility] [address] [place], [place], [address] Phone: [phone]   Fax:  [phone]   Name: [name] MRN: [id number] Date of Birth: [birth year]

## 2017-04-20 ENCOUNTER — Encounter: Payer: Self-pay | Admitting: Physician Assistant

## 2017-04-20 ENCOUNTER — Ambulatory Visit: Payer: BLUE CROSS/BLUE SHIELD | Admitting: Speech Pathology

## 2017-04-20 ENCOUNTER — Ambulatory Visit: Payer: BLUE CROSS/BLUE SHIELD | Admitting: Occupational Therapy

## 2017-04-20 ENCOUNTER — Ambulatory Visit (INDEPENDENT_AMBULATORY_CARE_PROVIDER_SITE_OTHER): Payer: BLUE CROSS/BLUE SHIELD | Admitting: *Deleted

## 2017-04-20 DIAGNOSIS — I639 Cerebral infarction, unspecified: Secondary | ICD-10-CM | POA: Diagnosis not present

## 2017-04-20 DIAGNOSIS — R278 Other lack of coordination: Secondary | ICD-10-CM

## 2017-04-20 DIAGNOSIS — M6281 Muscle weakness (generalized): Secondary | ICD-10-CM | POA: Diagnosis not present

## 2017-04-20 DIAGNOSIS — R41841 Cognitive communication deficit: Secondary | ICD-10-CM

## 2017-04-20 DIAGNOSIS — I69318 Other symptoms and signs involving cognitive functions following cerebral infarction: Secondary | ICD-10-CM

## 2017-04-20 NOTE — Therapy (Signed)
[facility] [address] [place], [place], [address] Phone: [phone]   Fax:  [phone]  Speech Language Pathology Treatment  Patient Details  Name: Jesus IlesChester Beckerman Li MRN: [id number] Date of Birth: 15-Apr-1965 Referring Provider: Dr. Maryla MorrowAnkit Patel   Encounter Date: 04/20/2017  End of Session - 04/20/17 1433    Visit Number  12    Number of Visits  17    Date for SLP Re-Evaluation  05/08/17    SLP Start Time  0850    SLP Stop Time   0930    SLP Time Calculation (min)  40 min    Activity Tolerance  Patient tolerated treatment well       Past Medical History:  Diagnosis Date  . 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 Environmental allergies   . Fall from roof    with kidney trauma  . Hypertension 2018  . Stroke [facility](HCC)     Past Surgical History:  Procedure Laterality Date  . LOOP RECORDER INSERTION N/A 02/16/2017   Procedure: LOOP RECORDER INSERTION;  Surgeon: Regan Lemmingamnitz, Will Martin, MD;  [facility];  Service: Cardiovascular;  Laterality: N/A;  . NO PAST SURGERIES    . TEE WITHOUT CARDIOVERSION N/A 02/16/2017   Procedure: TRANSESOPHAGEAL ECHOCARDIOGRAM (TEE);  Surgeon: Jake BatheSkains, Mark C, MD;  [facility];  Service: Cardiovascular;  Laterality: N/A;    There were no vitals filed for this visit.  Subjective Assessment - 04/20/17 0851    Subjective  "It's kind of like how I do my lunches" (re: organizing HW     Currently in Pain?  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 As pt was creating his list he demo'd self-checking spontaneously to ensure he had not omitted a customer from his list. SLP targeted functional organization and reasoning by having pt schedule visits with customers in free spaces around his current activities (therapy and appointment schedule) with additional constraints provided; pt with reasonable choices, explained rationale during this task. SLP asked pt what work tasks he might have difficulty with given deficit areas of attention. His response was verbose and while he described the minutiae of some tasks, including entering numbers into a computer system, he did not feel this would be difficult for him. SLP engaged pt in online bill-paying task, during which pt made several errors (entering web address, entering credit card digits) with extended time and external cues required for awareness (error message from website). SLP educated pt that skills required for accuracy in this task are also required in tasks he will need to complete for work, and that pt must be able to monitor and correct his mistakes.      Assessment / Recommendations / Plan   Plan  Continue with current plan of care  Progression Toward Goals   Progression toward goals  Progressing toward goals           SLP Long Term Goals - 04/20/17 1434      SLP LONG TERM GOAL #1   Title  Pt will complete moderately complex time/money, reasoning/problem solving tasks with 90% accuracy    Time  4    Period  Weeks    Status  On-going      SLP LONG TERM GOAL #2   Title  Pt will identify and correct errors in cognitive linguistic tasks in 80% of opportunities.     Time  4    Period  Weeks    Status  On-going      SLP LONG TERM GOAL #3   Title  Pt will demo attention to detail necessary for 100% success in detailed linguistic tasks given self  correction, over three sessions    Time  4    Period  Weeks    Status  On-going       Plan - 04/20/17 1433    Clinical Impression Statement  Higher level cognitive deficits in reasoning, problem solving, organization, awareness, and attention to detail. Pt cont to make slow steady progress however attention to detail was highlighted in pt's session today as deficient. Cue for verbosity x1. Cont'd skilled ST is recommended to target the above impairments in order to maxmize cognitive function for return to work.    Speech Therapy Frequency  2x / week    Duration  4 weeks    Treatment/Interventions  Compensatory strategies;Patient/family education;SLP instruction and feedback;Functional tasks;Internal/external aids;Compensatory techniques;Cognitive reorganization    Potential to Achieve Goals  Good    Consulted and Agree with Plan of Care  Patient       Patient will benefit from skilled therapeutic intervention in order to improve the following deficits and impairments:   Cognitive communication deficit    Problem List Patient Active Problem List   Diagnosis Date Noted  . 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 Right thyroid nodule 04/02/2017  . Hyperglycemia   . Poor nutrition   . Gastroesophageal reflux disease   . Hyperlipidemia   . Morbid obesity (HCC)   . History of stroke 02/12/2017  . Allergic rhinitis 11/07/2016  . BMI 39.0-39.9,adult 11/07/2016  . Essential hypertension 10/18/2016   Rondel Baton, MS, CCC-SLP Speech-Language Pathologist  [name] 04/20/2017, 2:35 PM  [place] [facility] [address] [place], [place], [address] Phone: [phone]   Fax:  [phone]   Name: [name] MRN: [id number] Date of Birth: 1965-09-29

## 2017-04-20 NOTE — Therapy (Signed)
[facility] [address] [place], [place], [address] Phone: [phone]   Fax:  [phone]  Occupational Therapy Treatment  Patient Details  Name: [name] MRN: [id number] Date of Birth: 09/18/1965 Referring Provider: Dr. Allena Katz   Encounter Date: 04/20/2017  OT End of Session - 04/20/17 1010    Visit Number  10    Number of Visits  17    Date for OT Re-Evaluation  05/02/17    Authorization Type  BC/BS    Authorization Time Period  90 combined    OT Start Time  0933    OT Stop Time  1015    OT Time Calculation (min)  42 min    Activity Tolerance  Patient tolerated treatment well    Behavior During Therapy  [facility] for tasks assessed/performed       Past Medical History:  Diagnosis Date  . 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 Environmental allergies   . Fall from roof    with kidney trauma  . Hypertension 2018  . Stroke [facility])     Past Surgical History:  Procedure Laterality Date  . LOOP RECORDER INSERTION N/A 02/16/2017   Procedure: LOOP RECORDER INSERTION;  Surgeon: Regan Lemming, MD;  [facility] INV[facility] LAB;  Service: Cardiovascular;  Laterality: N/A;  . NO PAST SURGERIES    . TEE WITHOUT CARDIOVERSION N/A 02/16/2017   Procedure: TRANSESOPHAGEAL ECHOCARDIOGRAM (TEE);  Surgeon: Jake Bathe, MD;  Location: [facility] ENDOSCOPY;  Service: Cardiovascular;  Laterality: N/A;    There were no vitals filed for this visit.  Subjective Assessment - 04/20/17 0934    Pertinent History  CVA 02/12/17, HTN     Limitations  **Loop recorder, no driving, no strenuous activtity    Patient Stated Goals  Get back to work, get back to driving    Currently in Pain?  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 Then gradually increase to higher traffic times/areas. Pt instructed to avoid interstate and night time driving until all other driving is no longer difficult/concerning      Hand Exercises   Other Hand Exercises  Coordination: Placing small pegs in pegboard Lt hand manipulating up to 5 pegs at a time with min to mod drops.       Visual/Perceptual Exercises   Scanning - Environmental  Pt performing environmental scanning while performing category generation id 10/12 items with mild difficulty dividing attention               OT Short Term Goals - 04/13/17 0906      OT SHORT TERM GOAL #1   Title  Independent with coordination and putty HEP     Time  4    Period  Weeks    Status  Achieved      OT SHORT TERM GOAL #2   Title  Pt to verbalize safety considerations with sensory loss Lt side    Time  4    Period  Weeks    Status  Achieved  OT SHORT TERM GOAL #3   Title  Pt to tie shoes mod I level with extra time as needed    Time  4    Period  Weeks    Status  Achieved      OT SHORT TERM GOAL #4   Title  Pt to divide attention between environmental scanning and physical or cognitive task with 90% accuracy    Time  4    Period  Weeks    Status  Achieved 04/13/17: 100%      OT SHORT TERM GOAL #5   Title  Pt to improve grip strength Lt hand to 52 lbs average or higher    Baseline  47 lbs    Time  4    Period  Weeks    Status  Achieved 04/13/17: 68, 55, 65 lbs        OT Long Term Goals - 04/13/17 0906      OT LONG TERM GOAL #1   Title  Pt to be independent with theraband HEP for LUE    Time  8    Period  Weeks    Status  Achieved      OT LONG TERM GOAL #2   Title  Pt to improve coordination Lt hand as evidenced by reducing speed on 9 hole peg test to 40 sec. or less    Baseline  55.53 sec.     Time  8    Period   Weeks    Status  On-going 04/13/17: 41.75 sec.       OT LONG TERM GOAL #3   Title  Pt to return to cooking full meal demo safety, organization, planning at mod I level with extra time as needed    Time  8    Period  Weeks    Status  Achieved per pt report      OT LONG TERM GOAL #4   Title  Pt to demo data entry and other computer tasks necessary to perform work related tasks with 100% accuracy    Time  8    Period  Weeks    Status  On-going            Plan - 04/20/17 1010    Clinical Impression Statement  Pt progressing towards remaining goals    Occupational Profile and client history currently impacting functional performance  HTN, loop recorder placed. Current deficits limiting pt's ability to perform IADLS and work related tasks    Rehab Potential  Good    OT Frequency  2x / week    OT Duration  8 weeks    OT Treatment/Interventions  Self-care/ADL training;DME and/or AE instruction;Therapeutic activities;Therapeutic exercise;Neuromuscular education;Patient/family education;Cognitive remediation/compensation;Fluidtherapy;Functional Mobility Training    Plan  check remaining goals and d/c O.T. next session    Consulted and Agree with Plan of Care  Patient       Patient will benefit from skilled therapeutic intervention in order to improve the following deficits and impairments:  Decreased coordination, Impaired sensation, Impaired UE functional use, Decreased strength, Decreased activity tolerance, Decreased cognition  Visit Diagnosis: Other lack of coordination  Other symptoms and signs involving cognitive functions following cerebral infarction    Problem List Patient Active Problem List   Diagnosis Date Noted  . 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 Right thyroid nodule 04/02/2017  . Hyperglycemia   . Poor nutrition   . Gastroesophageal reflux disease   .  Hyperlipidemia   . Morbid obesity (HCC)   . History of  stroke 02/12/2017  . Allergic rhinitis 11/07/2016  . BMI 39.0-39.9,adult 11/07/2016  . Essential hypertension 10/18/2016    Kelli Churn, OTR/L 04/20/2017, 10:14 AM  [place] [facility] [address] [place], [place], [address] Phone: [phone]   Fax:  [phone]  Name: [name] MRN: [id number] Date of Birth: 04/13/1965

## 2017-04-21 NOTE — Progress Notes (Signed)
Carelink Summary Report / Loop Recorder 

## 2017-04-22 ENCOUNTER — Ambulatory Visit: Payer: BLUE CROSS/BLUE SHIELD | Admitting: Occupational Therapy

## 2017-04-22 DIAGNOSIS — R278 Other lack of coordination: Secondary | ICD-10-CM

## 2017-04-22 DIAGNOSIS — M6281 Muscle weakness (generalized): Secondary | ICD-10-CM

## 2017-04-22 NOTE — Therapy (Signed)
[place] [address] [place], [place], [address] Phone: [phone]   Fax:  [phone]  Occupational Therapy Treatment  Patient Details  Name: [name] MRN: [id number] Date of Birth: 1965-10-25 Referring Provider: Dr. Posey Pronto   Encounter Date: 04/22/2017  OT End of Session - 04/22/17 1137    Visit Number  11    Number of Visits  17    Date for OT Re-Evaluation  05/02/17    Authorization Type  BC/BS    OT Start Time  1100    OT Stop Time  1125    OT Time Calculation (min)  25 min    Activity Tolerance  Patient tolerated treatment well       Past Medical History:  Diagnosis Date  . Acute ischemic right MCA stroke ([place]) 02/17/2017  . Environmental allergies   . Fall from roof    with kidney trauma  . Hypertension 2018  . Stroke [facility])     Past Surgical History:  Procedure Laterality Date  . LOOP RECORDER INSERTION N/A 02/16/2017   Procedure: LOOP RECORDER INSERTION;  Surgeon: Constance Haw, MD;  Location: [place] CV LAB;  Service: Cardiovascular;  Laterality: N/A;  . NO PAST SURGERIES    . TEE WITHOUT CARDIOVERSION N/A 02/16/2017   Procedure: TRANSESOPHAGEAL ECHOCARDIOGRAM (TEE);  Surgeon: Jerline Pain, MD;  Location: [facility] ENDOSCOPY;  Service: Cardiovascular;  Laterality: N/A;    There were no vitals filed for this visit.  Subjective Assessment - 04/22/17 1106    Subjective   I feel good about d/c     Pertinent History  CVA 02/12/17, HTN     Limitations  **Loop recorder, no driving, no strenuous activtity    Patient Stated Goals  Get back to work, get back to driving    Currently in Pain?  No/denies         [facility] OT Assessment - 04/22/17 0001      Coordination   Left 9 Hole Peg Test  27.31 sec.                OT Treatments/Exercises (OP) - 04/22/17 0001      ADLs   ADL Comments  Assessed remaining goals and progress to date. Pt with improved Lt hand coordination      Hand Exercises   Other Hand Exercises  Gripper set at level 3 resistance to pick up blocks Lt hand for sustained grip strength and graded pressure/proprioceptive feedback. Pt with only min drops/difficulty    Other Hand Exercises  Coordination: translating stress balls and rotating ball in fingertips               OT Short Term Goals - 04/13/17 0906      OT SHORT TERM GOAL #1   Title  Independent with coordination and putty HEP     Time  4    Period  Weeks    Status  Achieved      OT SHORT TERM GOAL #2   Title  Pt to verbalize safety considerations with sensory loss Lt side    Time  4    Period  Weeks    Status  Achieved      OT SHORT TERM GOAL #3   Title  Pt to tie shoes mod I level with extra time as needed    Time  4    Period  Weeks    Status  Achieved  OT SHORT TERM GOAL #4   Title  Pt to divide attention between environmental scanning and physical or cognitive task with 90% accuracy    Time  4    Period  Weeks    Status  Achieved 04/13/17: 100%      OT SHORT TERM GOAL #5   Title  Pt to improve grip strength Lt hand to 52 lbs average or higher    Baseline  47 lbs    Time  4    Period  Weeks    Status  Achieved 04/13/17: 68, 55, 65 lbs        OT Long Term Goals - 04/22/17 1116      OT LONG TERM GOAL #1   Title  Pt to be independent with theraband HEP for LUE    Time  8    Period  Weeks    Status  Achieved      OT LONG TERM GOAL #2   Title  Pt to improve coordination Lt hand as evidenced by reducing speed on 9 hole peg test to 40 sec. or less    Baseline  55.53 sec.     Time  8    Period  Weeks    Status  Achieved 04/13/17: 41.75 sec. 04/22/17: 27.31 sec      OT LONG TERM GOAL #3   Title  Pt to return to cooking full meal demo safety, organization, planning at mod I level with extra time as needed    Time  8    Period  Weeks    Status  Achieved per pt report      OT LONG TERM GOAL #4   Title  Pt to demo data entry and other computer tasks  necessary to perform work related tasks with 100% accuracy    Time  8    Period  Weeks    Status  Achieved per pt report            Plan - 04/22/17 1117    Clinical Impression Statement  Pt has met all STG's and LTG's    Occupational Profile and client history currently impacting functional performance  HTN, loop recorder placed. Current deficits limiting pt's ability to perform IADLS and work related tasks    Occupational performance deficits (Please refer to evaluation for details):  ADL's;IADL's;Work    Rehab Potential  Good    OT Frequency  2x / week    OT Duration  8 weeks    OT Treatment/Interventions  Self-care/ADL training;DME and/or AE instruction;Therapeutic activities;Therapeutic exercise;Neuromuscular education;Patient/family education;Cognitive remediation/compensation;Fluidtherapy;Functional Mobility Training    Plan  d/c OT    Consulted and Agree with Plan of Care  Patient       Patient will benefit from skilled therapeutic intervention in order to improve the following deficits and impairments:  Decreased coordination, Impaired sensation, Impaired UE functional use, Decreased strength, Decreased activity tolerance, Decreased cognition  Visit Diagnosis: Other lack of coordination  Muscle weakness (generalized)    Problem List Patient Active Problem List   Diagnosis Date Noted  . 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 Right thyroid nodule 04/02/2017  . Hyperglycemia   . Poor nutrition   . Gastroesophageal reflux disease   . Hyperlipidemia   . Morbid obesity ([place])   . History of stroke 02/12/2017  . Allergic rhinitis 11/07/2016  . BMI 39.0-39.9,adult 11/07/2016  . Essential hypertension 10/18/2016   OCCUPATIONAL THERAPY DISCHARGE  SUMMARY  Visits from Start of Care: 11  Current functional level related to goals / functional outcomes: See above   Remaining deficits: Mild coordination deficits Lt  hand Mild divided attention deficits   Education / Equipment: HEP  Plan: Patient agrees to discharge.  Patient goals were met. Patient is being discharged due to meeting the stated rehab goals.  ?????        Carey Bullocks, OTR/L 04/22/2017, 11:37 AM  [place] [address] [place], [place], [address] Phone: [phone]   Fax:  [phone]  Name: [name] MRN: [id number] Date of Birth: 03-14-1965

## 2017-04-23 ENCOUNTER — Ambulatory Visit: Payer: BLUE CROSS/BLUE SHIELD | Admitting: Speech Pathology

## 2017-04-23 DIAGNOSIS — M6281 Muscle weakness (generalized): Secondary | ICD-10-CM | POA: Diagnosis not present

## 2017-04-23 DIAGNOSIS — R41841 Cognitive communication deficit: Secondary | ICD-10-CM

## 2017-04-23 NOTE — Therapy (Signed)
[facility] [address] [place], [place], [address] Phone: [phone]   Fax:  [phone]  Speech Language Pathology Treatment  Patient Details  Name: [name] MRN: [id number] Date of Birth: 08/22/1965 Referring Provider: Dr. Maryla MorrowAnkit Patel   Encounter Date: 04/23/2017  End of Session - 04/23/17 0949    Visit Number  13    Number of Visits  17    Date for SLP Re-Evaluation  05/08/17    SLP Start Time  0847    SLP Stop Time   0930    SLP Time Calculation (min)  43 min    Activity Tolerance  Patient tolerated treatment well       Past Medical History:  Diagnosis Date  . 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 Environmental allergies   . Fall from roof    with kidney trauma  . Hypertension 2018  . Stroke [facility](HCC)     Past Surgical History:  Procedure Laterality Date  . LOOP RECORDER INSERTION N/A 02/16/2017   Procedure: LOOP RECORDER INSERTION;  Surgeon: Regan Lemmingamnitz, Will Martin, MD;  [facility]: [facility] CV LAB;  Service: Cardiovascular;  Laterality: N/A;  . NO PAST SURGERIES    . TEE WITHOUT CARDIOVERSION N/A 02/16/2017   Procedure: TRANSESOPHAGEAL ECHOCARDIOGRAM (TEE);  Surgeon: Jake BatheSkains, Mark C, MD;  [facility];  Service: Cardiovascular;  Laterality: N/A;    There were no vitals filed for this visit.  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  No/denies            ADULT SLP TREATMENT - 04/23/17 0851      General Information   Behavior/Cognition  Alert;Cooperative;Pleasant mood    Patient Positioning  Upright in chair      Treatment Provided   Treatment provided  Cognitive-Linquistic      Pain Assessment   Pain Assessment  No/denies pain      Cognitive-Linquistic Treatment   Treatment focused on  Cognition    Skilled Treatment  Pt told SLP he was unable to use work documents for home  exercise as he was unable to access them. SLP facilitated mod complex problem solving, error awareness with task figuring elapsed time. SLP questioned pt prior to beginning the task about what he should do after completing the task; pt required rare min A question cues and ultimately stated, "check and make sure everything's right." Pt began to employ a strategy (using tallies) for accuracy. After completion he reviewed his task for errors, ID'ing and correcting 2/4; mod cues required for 50% of errors. SLP pointed out that pt errors increased when pt not employing strategy. Pt agreed that taking shortcuts or when tasks seemed "easy" he is more likely to make a mistake.       Assessment / Recommendations / Plan   Plan  Continue with current plan of care      Progression Toward Goals   Progression toward goals  Progressing toward goals           SLP Long Term Goals - 04/23/17 [id number]      SLP LONG TERM GOAL #1   Title  Pt will complete moderately complex time/money, reasoning/problem solving tasks with 90% accuracy    Time  3    Period  Weeks    Status  On-going      SLP LONG  TERM GOAL #2   Title  Pt will identify and correct errors in cognitive linguistic tasks in 80% of opportunities.     Time  3    Period  Weeks    Status  On-going      SLP LONG TERM GOAL #3   Title  Pt will demo attention to detail necessary for 100% success in detailed linguistic tasks given self correction, over three sessions    Time  3    Period  Weeks    Status  On-going       Plan - 04/23/17 0949    Clinical Impression Statement  Higher level cognitive deficits in reasoning, problem solving, organization, awareness, and attention to detail. Pt cont to make slow steady progress however error awareness was highlighted in pt's session today as deficient. Subtle nonverbal cue for verbosity today x1. Cont'd skilled ST is recommended to target the above impairments in order to maxmize cognitive function for  return to work.    Speech Therapy Frequency  2x / week    Duration  4 weeks    Treatment/Interventions  Compensatory strategies;Patient/family education;SLP instruction and feedback;Functional tasks;Internal/external aids;Compensatory techniques;Cognitive reorganization    Potential to Achieve Goals  Good    Consulted and Agree with Plan of Care  Patient       Patient will benefit from skilled therapeutic intervention in order to improve the following deficits and impairments:   Cognitive communication deficit    Problem List Patient Active Problem List   Diagnosis Date Noted  . 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 Right thyroid nodule 04/02/2017  . Hyperglycemia   . Poor nutrition   . Gastroesophageal reflux disease   . Hyperlipidemia   . Morbid obesity (HCC)   . History of stroke 02/12/2017  . Allergic rhinitis 11/07/2016  . BMI 39.0-39.9,adult 11/07/2016  . Essential hypertension 10/18/2016   Rondel Baton, MS, CCC-SLP Speech-Language Pathologist  [name] 04/23/2017, 9:51 AM  [facility] [address] [place], [place], [address] Phone: [phone]   Fax:  [phone]   Name: [name] MRN: [id number] Date of Birth: [birth year]

## 2017-04-27 ENCOUNTER — Encounter: Payer: BLUE CROSS/BLUE SHIELD | Admitting: Occupational Therapy

## 2017-04-27 ENCOUNTER — Ambulatory Visit (INDEPENDENT_AMBULATORY_CARE_PROVIDER_SITE_OTHER): Payer: BLUE CROSS/BLUE SHIELD | Admitting: Diagnostic Neuroimaging

## 2017-04-27 ENCOUNTER — Ambulatory Visit: Payer: BLUE CROSS/BLUE SHIELD | Admitting: Speech Pathology

## 2017-04-27 ENCOUNTER — Encounter: Payer: Self-pay | Admitting: Diagnostic Neuroimaging

## 2017-04-27 VITALS — BP 137/93 | HR 80 | Ht 71.0 in | Wt 261.6 lb

## 2017-04-27 DIAGNOSIS — I63411 Cerebral infarction due to embolism of right middle cerebral artery: Secondary | ICD-10-CM

## 2017-04-27 DIAGNOSIS — I63441 Cerebral infarction due to embolism of right cerebellar artery: Secondary | ICD-10-CM | POA: Diagnosis not present

## 2017-04-27 DIAGNOSIS — M6281 Muscle weakness (generalized): Secondary | ICD-10-CM | POA: Diagnosis not present

## 2017-04-27 DIAGNOSIS — R41841 Cognitive communication deficit: Secondary | ICD-10-CM

## 2017-04-27 DIAGNOSIS — I10 Essential (primary) hypertension: Secondary | ICD-10-CM

## 2017-04-27 DIAGNOSIS — R0683 Snoring: Secondary | ICD-10-CM

## 2017-04-27 NOTE — Progress Notes (Signed)
[facility]  PATIENT: [name] DOB: Sep 09, 1965  REFERRING CLINICIAN: Haywood Filler, NP HISTORY FROM: patient and wife and chart review REASON FOR VISIT: new consult    HISTORICAL  CHIEF COMPLAINT:  Chief Complaint  Patient presents with  . Hospitalization Follow-up    Stroke 02/12/2017.   Marland Kitchen Cerebrovascular Accident    Pt has finished PT/OT.  Continues with ST.  Wanting to get back to driving.     HISTORY OF PRESENT ILLNESS:   52 year old male with hypertension, hypercholesterolemia, obesity, here for evaluation of stroke.  Patient presented to the hospital in January 2019 for left-sided weakness, left-sided numbness and slurred speech.  IV TPA was given.  CTA of the head and neck was negative for large vessel stenosis or occlusion.  Patient was admitted for further workup.  Patient was found to have right parietal and right cerebellar ischemic infarction, raising possibility of proximal embolic source.  TEE was negative.  Patient had loop recorder placed.  Hypercoagulable workup was obtained which showed some abnormal lab results, and was recommended to have repeat testing in outpatient neurology clinic.  Since that time patient is doing well.  He has had improvement in his left-sided weakness.  He has completed physical and occupational therapy.  He is tolerating his medications.  Patient does have some snoring, daytime fatigue, but has not had sleep study in the past.  Patient does not return to work.  He would like to return to work soon.  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  Patient does not return back to driving yet.  He feels like he is ready to start gradually working and driving.   REVIEW OF SYSTEMS: Full 14 system review of systems performed and negative with exception of: Allergies.  ALLERGIES: Allergies  Allergen Reactions  . Other     Strawberries and  blueberries   . Penicillins Other (See Comments)    Headache Has patient had a PCN reaction causing immediate rash, facial/tongue/throat swelling, SOB or lightheadedness with hypotension: No Has patient had a PCN reaction causing severe rash involving mucus membranes or skin necrosis: No Has patient had a PCN reaction that required hospitalization: No Has patient had a PCN reaction occurring within the last 10 years:No If all of the above answers are "NO", then may proceed with Cephalosporin use.     HOME MEDICATIONS: Outpatient Medications Prior to Visit  Medication Sig Dispense Refill  . acetaminophen (TYLENOL) 325 MG tablet Take 650 mg by mouth every 6 (six) hours as needed for mild pain.    Marland Kitchen amLODipine (NORVASC) 10 MG tablet Take 1 tablet (10 mg total) by mouth daily. 90 tablet 3  . aspirin EC 325 MG EC tablet Take 1 tablet (325 mg total) by mouth daily. 100 tablet 0  . atorvastatin (LIPITOR) 40 MG tablet Take 1 tablet (40 mg total) by mouth daily at 6 PM. 90 tablet 3  . cetirizine (ZYRTEC) 10 MG tablet Take 10 mg by mouth daily as needed for allergies.    . famotidine (PEPCID) 20 MG tablet Take 1 tablet (20 mg total) by mouth daily. 90 tablet 3  . lisinopril (PRINIVIL,ZESTRIL) 20 MG tablet Take 1 tablet (20 mg total) by mouth daily. 90 tablet 3   No facility-administered medications prior to visit.     PAST MEDICAL HISTORY: Past Medical History:  Diagnosis Date  . Acute ischemic right MCA stroke (HCC)  02/17/2017  . Environmental allergies   . Fall from roof    with kidney trauma  . High cholesterol   . Hypertension 2018  . Stroke (HCC) 02/12/2017    PAST SURGICAL HISTORY: Past Surgical History:  Procedure Laterality Date  . LOOP RECORDER INSERTION N/A 02/16/2017   Procedure: LOOP RECORDER INSERTION;  Surgeon: Regan Lemmingamnitz, Will Martin, MD;  [facility];  Service: Cardiovascular;  Laterality: N/A;  . NO PAST SURGERIES    . TEE WITHOUT CARDIOVERSION N/A  02/16/2017   Procedure: TRANSESOPHAGEAL ECHOCARDIOGRAM (TEE);  Surgeon: Jake BatheSkains, Mark C, MD;  [facility];  Service: Cardiovascular;  Laterality: N/A;    FAMILY HISTORY: Family History  Problem Relation Age of Onset  . Hypertension Mother   . Hypertension Father   . Stroke Father   . Hypertension Sister   . Leukemia Paternal Uncle     SOCIAL HISTORY:  Social History   Socioeconomic History  . Marital status: Married    Spouse name: [name]  . Number of children: 0  . Years of education: College  . Highest education level: Not on file  Social Needs  . Financial resource strain: Not on file  . Food insecurity - worry: Not on file  . Food insecurity - inability: Not on file  . Transportation needs - medical: Not on file  . Transportation needs - non-medical: Not on file  Occupational History  . Occupation: outside Airline pilotsales    Comment: [employer]  Tobacco Use  . Smoking status: Never Smoker  . Smokeless tobacco: Never Used  Substance and Sexual Activity  . Alcohol use: Yes    Alcohol/week: 0.0 oz    Comment: rarely, 1-2 times/year  . Drug use: No  . Sexual activity: Yes    Partners: Female  Other Topics Concern  . Not on file  Social History Narrative   Lives with his wife, [name] at home and 2 dogs.  [employer].  No children.  Employed by [employer].   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      PHYSICAL EXAM  GENERAL EXAM/CONSTITUTIONAL: Vitals:  Vitals:   04/27/17 0915  BP: (!) 137/93  Pulse: 80  Weight: 261 lb 9.6 oz (118.7 kg)  Height: 5\' 11"  (1.803 m)     Body mass index is 36.49 kg/m.  Visual Acuity Screening   Right eye Left eye Both eyes  Without correction:     With correction: 20/30 20/40      Patient is in no distress; well developed, nourished and groomed; neck is supple  CARDIOVASCULAR:  Examination of carotid arteries is normal; no carotid bruits  Regular rate and rhythm, no murmurs  Examination of  peripheral vascular system by observation and palpation is normal  EYES:  Ophthalmoscopic exam of optic discs and posterior segments is normal; no papilledema or hemorrhages  MUSCULOSKELETAL:  Gait, strength, tone, movements noted in Neurologic exam below  NEUROLOGIC: MENTAL STATUS:  No flowsheet data found.  awake, alert, oriented to person, place and time  recent and remote memory intact  normal attention and concentration  language fluent, comprehension intact, naming intact,   fund of knowledge appropriate  CRANIAL NERVE:   2nd - no papilledema on fundoscopic exam  2nd, 3rd, 4th, 6th - pupils equal and reactive to light, visual fields full to confrontation, extraocular muscles intact, no nystagmus  5th - facial sensation symmetric  7th - facial strength --> DECR LEFT NL FOLD  8th - hearing  intact  9th - palate elevates symmetrically, uvula midline  11th - shoulder shrug symmetric  12th - tongue protrusion midline  MOTOR:   normal bulk and tone, full strength in the BUE, BLE; EXCEPT LEFT RICEPS (4/5)  RIGHT ARM ORBITS AROUND LEFT  SENSORY:   normal and symmetric to light touch, temperature, vibration  COORDINATION:   finger-nose-finger, fine finger movements normal  REFLEXES:   deep tendon reflexes BRISK BILATERALLY; SLIGHTLY MORE ON LEFT SIDE  GAIT/STATION:   narrow based gait; DECREASED LEFT ARM SWING     DIAGNOSTIC DATA (LABS, IMAGING, TESTING) - I reviewed patient records, labs, notes, testing and imaging myself where available.  Lab Results  Component Value Date   WBC 7.2 02/24/2017   HGB 14.4 02/24/2017   HCT 42.1 02/24/2017   MCV 91.7 02/24/2017   PLT 301 02/24/2017      Component Value Date/Time   NA 141 02/23/2017 0741   NA 140 11/26/2016 1040   K 4.0 02/23/2017 0741   CL 107 02/23/2017 0741   CO2 23 02/23/2017 0741   GLUCOSE 111 (H) 02/23/2017 0741   BUN 28 (H) 02/23/2017 0741   BUN 18 11/26/2016 1040   CREATININE  1.23 02/23/2017 0741   CALCIUM 9.5 02/23/2017 0741   PROT 7.2 02/18/2017 0555   PROT 7.7 11/26/2016 1040   ALBUMIN 3.5 02/18/2017 0555   ALBUMIN 4.6 11/26/2016 1040   AST 35 02/18/2017 0555   ALT 45 02/18/2017 0555   ALKPHOS 84 02/18/2017 0555   BILITOT 2.3 (H) 02/18/2017 0555   BILITOT 1.0 11/26/2016 1040   GFRNONAA >60 02/23/2017 0741   GFRAA >60 02/23/2017 0741   Lab Results  Component Value Date   CHOL 185 02/13/2017   HDL 28 (L) 02/13/2017   LDLCALC 137 (H) 02/13/2017   TRIG 98 02/13/2017   CHOLHDL 6.6 02/13/2017   Lab Results  Component Value Date   HGBA1C 5.5 02/13/2017   No results found for: VITAMINB12 Lab Results  Component Value Date   TSH 1.920 11/26/2016    02/12/17 MRI brain [I reviewed images myself and agree with interpretation. -VRP]  1. Small areas of acute/early subacute cortical infarction in right posterior insula and right lateral parietal lobe. No  hemorrhage or mass effect. 2. Very small right superior cerebellar hemisphere subacute infarct.  02/12/17 CTA head / neck [I reviewed images myself and agree with interpretation. -VRP]  - Negative for large or medium vessel occlusion. - No sign of atherosclerotic vascular disease, dissection or other focal vascular finding. - Incidental detection of indistinct nodular shadows in the right upper lobe. These could represent inflammatory foci or nodules. Complete chest CT recommended at some point.  03/17/17 CT chest 1. Right apical nodules on 02/12/2017 chest CT are resolved, compatible with inflammatory process. No suspicious pulmonary nodule. 2. Generalized airway thickening. 3. 2 cm right thyroid nodule, which meets size criteria for recommended sonographic follow-up. 4. Healing right anterior sixth and seventh rib fractures. - For clarification, the intention is for recommended thyroid ultrasound to be obtained now rather than after a certain interval.  02/15/17 BLE u/s - Right: There is no evidence of deep  vein thrombosis in the lower extremity.There is no evidence of superficial venous thrombosis. No cystic structure found in the popliteal fossa. - Left: There is no evidence of deep vein thrombosis in the lower extremity.There is no evidence of superficial venous thrombosis. No cystic structure found in the popliteal fossa.  02/16/17 TEE - No cardiac source of  emboli was indentified.   Anticardiolipin IgG 0 - 14 GPL U/mL 90 Abnormally high    Comment: (NOTE)              Negative:       <15              Indeterminate:   15 - 20              Low-Med Positive: >20 - 80              High Positive:     >80   Anticardiolipin IgM 0 - 12 MPL U/mL <9   Comment: (NOTE)              Negative:       <13              Indeterminate:   13 - 20              Low-Med Positive: >20 - 80              High Positive:     >80   Anticardiolipin IgA 0 - 11 APL U/mL 16 Abnormally high    Comment: (NOTE)              Negative:       <12              Indeterminate:   12 - 20              Low-Med Positive: >20 - 80              High Positive:     >80    ds DNA Ab 0 - 9 IU/mL 30 Abnormally high     ANA Ab, IFA Positive Abnormal     dRVVT Mix 0.0 - 47.0 sec 47.4 Abnormally high     dRVVT Confirm 0.8 - 1.2 ratio 2.1 Abnormally high     Beta-2 Glyco I IgG 0 - 20 GPI IgG units 111 Abnormally high     Beta-2 Glyco I IgG 0 - 20 GPI IgG units 111 Abnormally high      Beta-2-Glycoprotein I IgM 0 - 32 GPI IgM units <9     Beta-2-Glycoprotein I IgA 0 - 25 GPI IgA units 14    PTT Lupus Anticoagulant 0.0 - 51.9 sec 43.0   DRVVT 0.0 - 47.0 sec 77.1 Abnormally high        ASSESSMENT AND PLAN  52 y.o. year old male here with embolic strokes in Jan 2019:  Dx: right MCA including right insular cortex and right parietal  cortex small infarcts as well as right punctate cerebellar infarct, embolic pattern, unclear source  1. Cerebrovascular accident (CVA) due to embolism of right middle cerebral artery (HCC)   2. 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 Hypercoagulable panel, comprehensive  . ANA w/Reflex if Positive  . CYCLIC CITRUL PEPTIDE ANTIBODY, IGG/IGA  . Ambulatory referral to Sleep Studies   Return in about 6 months (around  10/28/2017).  I reviewed images, labs, notes, records myself. I summarized findings and reviewed with patient, for this high risk condition (embolic strokes) requiring high complexity decision making.    Suanne Marker, MD 04/27/2017, 9:51 AM Certified in Neurology, Neurophysiology and Neuroimaging  [facility] [address], [address] [place], [place] [address] [phone]

## 2017-04-27 NOTE — Therapy (Signed)
[facility] [address] [place], [place], [address] Phone: [phone]   Fax:  [phone]  Speech Language Pathology Treatment  Patient Details  Name: [name] MRN: [id number] Date of Birth: 08-Jul-1965 Referring Provider: Dr. Maryla MorrowAnkit Patel   Encounter Date: 04/27/2017  End of Session - 04/27/17 0945    Visit Number  14    Number of Visits  17    Date for SLP Re-Evaluation  05/08/17    SLP Start Time  0805    SLP Stop Time   0846    SLP Time Calculation (min)  41 min    Activity Tolerance  Patient tolerated treatment well       Past Medical History:  Diagnosis Date  . 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 Environmental allergies   . Fall from roof    with kidney trauma  . High cholesterol   . Hypertension 2018  . Stroke [facility](HCC) 02/12/2017    Past Surgical History:  Procedure Laterality Date  . LOOP RECORDER INSERTION N/A 02/16/2017   Procedure: LOOP RECORDER INSERTION;  Surgeon: Regan Lemmingamnitz, Will Martin, MD;  [facility];  Service: Cardiovascular;  Laterality: N/A;  . NO PAST SURGERIES    . TEE WITHOUT CARDIOVERSION N/A 02/16/2017   Procedure: TRANSESOPHAGEAL ECHOCARDIOGRAM (TEE);  Surgeon: Jake BatheSkains, Mark C, MD;  [facility];  Service: Cardiovascular;  Laterality: N/A;    There were no vitals filed for this visit.  Subjective Assessment - 04/27/17 0808    Subjective  "I did 2 sheets Friday and 2 sheets yesterday."    Currently in Pain?  No/denies            ADULT SLP TREATMENT - 04/27/17 0805      General Information   Behavior/Cognition  Alert;Cooperative;Pleasant mood    Patient Positioning  Upright in chair      Treatment Provided   Treatment provided  Cognitive-Linquistic      Pain Assessment   Pain Assessment  No/denies pain      Cognitive-Linquistic Treatment   Treatment focused on  Cognition    Skilled Treatment  Pt arrived with homework  completed; had checked for errors and achieved 100% accuracy for mod complex finance task. SLP facilitated mod complex reasoning/planning task; pt initially completed the task quickly, but as he reviewed for errors he identified several. He corrected errors with extended time; rare min A question cues for reasoning and attention to detail. During the task, pt independently used strategies to mark his progress in the task. SLP engaged pt in functional task targeting reasoning, planning, and attention to detail (trip planning for an away hockey game). He required rare min A for reasoning and sequencing (checking game schedule before checking for directions). Pt to complete task at home and bring to next session.      Assessment / Recommendations / Plan   Plan  Continue with current plan of care      Progression Toward Goals   Progression toward goals  Progressing toward goals           SLP Long Term Goals - 04/27/17 0941      SLP LONG TERM GOAL #1   Title  Pt will complete moderately complex time/money, reasoning/problem solving tasks with 90% accuracy    Baseline  04/27/17 - finance tasks    Time  2    Period  Weeks    Status  On-going  SLP LONG TERM GOAL #2   Title  Pt will identify and correct errors in cognitive linguistic tasks in 80% of opportunities.     Baseline  04/27/17    Time  2    Period  Weeks      SLP LONG TERM GOAL #3   Title  Pt will demo attention to detail necessary for 100% success in detailed linguistic tasks given self correction, over three sessions       Plan - 04/27/17 0946    Clinical Impression Statement  Higher level cognitive deficits in reasoning, problem solving, organization, awareness, and attention to detail. Error awareness continues to improve; pt required rare min A today for attention to detail and mod-complex reasoning. Cont'd skilled ST is recommended to target the above impairments in order to maxmize cognitive function for return to work.     Speech Therapy Frequency  2x / week    Duration  4 weeks    Treatment/Interventions  Compensatory strategies;Patient/family education;SLP instruction and feedback;Functional tasks;Internal/external aids;Compensatory techniques;Cognitive reorganization    Potential to Achieve Goals  Good    Consulted and Agree with Plan of Care  Patient       Patient will benefit from skilled therapeutic intervention in order to improve the following deficits and impairments:   Cognitive communication deficit    Problem List Patient Active Problem List   Diagnosis Date Noted  . 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 Right thyroid nodule 04/02/2017  . Hyperglycemia   . Poor nutrition   . Gastroesophageal reflux disease   . Hyperlipidemia   . Morbid obesity (HCC)   . History of stroke 02/12/2017  . Allergic rhinitis 11/07/2016  . BMI 39.0-39.9,adult 11/07/2016  . Essential hypertension 10/18/2016   Rondel Baton, MS, CCC-SLP Speech-Language Pathologist  [name] 04/27/2017, 9:47 AM  [facility] [address] [place], [place], [address] Phone: [phone]   Fax:  [phone]   Name: [name] MRN: [id number] Date of Birth: October 30, 1965

## 2017-04-27 NOTE — Patient Instructions (Signed)
EMBOLIC STROKES - continue aspirin, statin, BP control  - setup sleep study to screen for OSA  - continue loop recorder monitoring  - repeat hypercoagulable panel  - ok to gradually return to work  - ok to try return to driving and gradually increase complexity; (with supervision, in parking lot at first, then less busy streets, then short distances)

## 2017-04-27 NOTE — Progress Notes (Signed)
Interesting abnormal pattern definitely wait till resultts of repeat labs. If lupus anticoagulant stays positive or rising titre of anticardiolipin antibodieswill need referral to hematologist and probably long term anticoagulation

## 2017-04-27 NOTE — Patient Instructions (Signed)
Homework (continue task from session)  You are going to take a trip to see the [place] play an away game. Find a game that will be occurring within driving distance. Create itinerary for your trip including dates, times, routes, and a budget that includes all the costs for the trip. You will need to find a place to stay the night.

## 2017-04-28 ENCOUNTER — Ambulatory Visit: Payer: BLUE CROSS/BLUE SHIELD

## 2017-04-28 DIAGNOSIS — R41841 Cognitive communication deficit: Secondary | ICD-10-CM

## 2017-04-28 DIAGNOSIS — M6281 Muscle weakness (generalized): Secondary | ICD-10-CM | POA: Diagnosis not present

## 2017-04-28 NOTE — Therapy (Signed)
[facility] [address] [place], [place], [address] Phone: [phone]   Fax:  [phone]  Speech Language Pathology Treatment  Patient Details  Name: Jesus IlesChester Binion Li MRN: [id number] Date of Birth: 12/26/1965 Referring Provider: Dr. Maryla MorrowAnkit Patel   Encounter Date: 04/28/2017  End of Session - 04/28/17 1535    Visit Number  15    Number of Visits  17    Date for SLP Re-Evaluation  05/08/17    SLP Start Time  1447    SLP Stop Time   1530    SLP Time Calculation (min)  43 min    Activity Tolerance  Patient tolerated treatment well       Past Medical History:  Diagnosis Date  . 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 Environmental allergies   . Fall from roof    with kidney trauma  . High cholesterol   . Hypertension 2018  . Stroke [facility](HCC) 02/12/2017    Past Surgical History:  Procedure Laterality Date  . LOOP RECORDER INSERTION N/A 02/16/2017   Procedure: LOOP RECORDER INSERTION;  Surgeon: Regan Lemmingamnitz, Will Martin, MD;  [facility];  Service: Cardiovascular;  Laterality: N/A;  . NO PAST SURGERIES    . TEE WITHOUT CARDIOVERSION N/A 02/16/2017   Procedure: TRANSESOPHAGEAL ECHOCARDIOGRAM (TEE);  Surgeon: Jake BatheSkains, Mark C, MD;  [facility];  Service: Cardiovascular;  Laterality: N/A;    There were no vitals filed for this visit.  Subjective Assessment - 04/28/17 1501    Subjective  Pt didn't bring his hockey task because     Currently in Pain?  No/denies            ADULT SLP TREATMENT - 04/28/17 1502      General Information   Behavior/Cognition  Alert;Cooperative;Pleasant mood      Treatment Provided   Treatment provided  Cognitive-Linquistic      Cognitive-Linquistic Treatment   Treatment focused on  Cognition    Skilled Treatment  SLP targeted pt's attention to detail in a grocery task (pt goes to grocery once/week) in which he had to follow a specific shopping list  and remain on budget. Pt focused on budget instead of both budget AND keeping to the details of his list. He did not double check the list until SLP told him he had two errors, and pt req'd mod A to see one error he missed when re-checking the work. 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        Assessment / Recommendations / Plan   Plan  Continue with current plan of care      Progression Toward Goals   Progression toward goals  Progressing toward goals           SLP Long Term Goals - 04/28/17 1537      SLP LONG TERM GOAL #1   Title  Pt will complete moderately complex time/money, reasoning/problem solving tasks with 90% accuracy    Baseline  04/27/17 - finance tasks    Time  2    Period  Weeks    Status  On-going      SLP LONG TERM GOAL #2   Title  Pt will identify and correct errors in cognitive linguistic tasks in 80% of opportunities over three sessions    Baseline  04/27/17    Status  Revised      SLP  LONG TERM GOAL #3   Title  Pt will demo attention to detail necessary for 100% success in detailed linguistic tasks given self correction, over three sessions    Time  2    Period  Weeks    Status  On-going       Plan - 04/28/17 1536    Clinical Impression Statement  Higher level cognitive deficits seen today in organization, awareness, and attention to detail.  Pt req'd cues for double checking his work, and for emergent awareness. Cont'd skilled ST is recommended to target the above impairments in order to maxmize cognitive function for return to work.    Speech Therapy Frequency  2x / week    Duration  4 weeks    Treatment/Interventions  Compensatory strategies;Patient/family education;SLP instruction and feedback;Functional tasks;Internal/external aids;Compensatory techniques;Cognitive reorganization    Potential to Achieve Goals  Good       Patient will benefit from skilled therapeutic  intervention in order to improve the following deficits and impairments:   Cognitive communication deficit    Problem List Patient Active Problem List   Diagnosis Date Noted  . 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 Right thyroid nodule 04/02/2017  . Hyperglycemia   . Poor nutrition   . Gastroesophageal reflux disease   . Hyperlipidemia   . Morbid obesity (HCC)   . History of stroke 02/12/2017  . Allergic rhinitis 11/07/2016  . BMI 39.0-39.9,adult 11/07/2016  . Essential hypertension 10/18/2016    [facility] ,MS, CCC-SLP' 04/28/2017, 3:41 PM   [facility] [address] [place], [place], [address] Phone: [phone]   Fax:  [phone]   Name: [name] MRN: [id number] Date of Birth: 01-Dec-1965

## 2017-05-04 ENCOUNTER — Ambulatory Visit: Payer: BLUE CROSS/BLUE SHIELD | Admitting: Speech Pathology

## 2017-05-04 DIAGNOSIS — R41841 Cognitive communication deficit: Secondary | ICD-10-CM

## 2017-05-04 DIAGNOSIS — M6281 Muscle weakness (generalized): Secondary | ICD-10-CM | POA: Diagnosis not present

## 2017-05-04 LAB — STATUS REPORT

## 2017-05-04 NOTE — Patient Instructions (Signed)
  Contact your HR administrator and find out what your options are for a gradual return to work.   Make a list of a potential work schedule might look like for a graduated return, and a timeline for increasing your involvement. What are some of the areas where you might need support?

## 2017-05-04 NOTE — Therapy (Signed)
[facility] [address] [place], [place], [address] Phone: [phone]   Fax:  [phone]  Speech Language Pathology Treatment  Patient Details  Name: [name] MRN: [id number] Date of Birth: 1965/02/22 Referring Provider: Dr. Maryla MorrowAnkit Patel   Encounter Date: 05/04/2017  End of Session - 05/04/17 0904    Visit Number  16    Number of Visits  17    Date for SLP Re-Evaluation  05/08/17    SLP Start Time  0803    SLP Stop Time   0847    SLP Time Calculation (min)  44 min    Activity Tolerance  Patient tolerated treatment well       Past Medical History:  Diagnosis Date  . 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 Environmental allergies   . Fall from roof    with kidney trauma  . High cholesterol   . Hypertension 2018  . Stroke [facility](HCC) 02/12/2017    Past Surgical History:  Procedure Laterality Date  . LOOP RECORDER INSERTION N/A 02/16/2017   Procedure: LOOP RECORDER INSERTION;  Surgeon: Regan Lemmingamnitz, Will Martin, MD;  Location: [facility] INVASIVE CV LAB;  Service: Cardiovascular;  Laterality: N/A;  . NO PAST SURGERIES    . TEE WITHOUT CARDIOVERSION N/A 02/16/2017   Procedure: TRANSESOPHAGEAL ECHOCARDIOGRAM (TEE);  Surgeon: Jake BatheSkains, Mark C, MD;  [facility];  Service: Cardiovascular;  Laterality: N/A;    There were no vitals filed for this visit.  Subjective Assessment - 05/04/17 0804    Subjective  Pt arrives with homework and hockey trip task    Currently in Pain?  No/denies            ADULT SLP TREATMENT - 05/04/17 0803      General Information   Behavior/Cognition  Alert;Cooperative    Patient Positioning  Upright in chair      Treatment Provided   Treatment provided  Cognitive-Linquistic      Pain Assessment   Pain Assessment  No/denies pain      Cognitive-Linquistic Treatment   Treatment focused on  Cognition    Skilled Treatment  Pt told SLP he had been cleared by MD for  gradual return to driving and work. Pt stated he thought a gradual return to work might be "too complicated" to work out. SLP told pt that given his continued deficits in organization, awareness and attention to detail, pt is not ready for a full return to work. Encouraged pt to consider options for a gradual return and discuss with his HR manager. SLP targeted organization and attention to detail with functional map tasks. Pt required extended time, however did spontaneously check for and make 2 corrections in first task. In second task, SLP had to tell pt there were 2 errors (attention to detail), which he then corrected. SLP educated that due to the need for extended time in organization tasks and as errors resulting from decreased attention to detail, pt is likely to experience impacts from his deficits when he returns to work, and reinforced return to work in a graduated, limited capacity. Pt in agreement and to discuss with HR, come up with a plan and considerations for return to work for homework.      Assessment / Recommendations / Plan   Plan  Continue with current plan of care      Progression Toward Goals   Progression toward goals  Progressing toward goals       SLP Education -  05/04/17 1610    Education provided  Yes    Education Details  graduated vs full return to work    [employer]) Educated  Patient    Methods  Explanation;Demonstration    Comprehension  Verbalized understanding         SLP Long Term Goals - 05/04/17 0905      SLP LONG TERM GOAL #1   Title  Pt will complete moderately complex time/money, reasoning/problem solving tasks with 90% accuracy    Time  1    Period  Weeks    Status  On-going      SLP LONG TERM GOAL #2   Title  Pt will identify and correct errors in cognitive linguistic tasks in 80% of opportunities over three sessions    Time  1    Period  Weeks    Status  On-going      SLP LONG TERM GOAL #3   Title  Pt will demo attention to detail  necessary for 100% success in detailed linguistic tasks given self correction, over three sessions    Time  1    Period  Weeks    Status  On-going       Plan - 05/04/17 0905    Clinical Impression Statement  Higher level cognitive deficits seen today in organization, awareness, and attention to detail.  Pt req'd cues for double checking his work, and for emergent awareness. Cont'd skilled ST is recommended to target the above impairments in order to maximize cognitive function for return to work.    Speech Therapy Frequency  2x / week    Duration  4 weeks    Treatment/Interventions  Compensatory strategies;Patient/family education;SLP instruction and feedback;Functional tasks;Internal/external aids;Compensatory techniques;Cognitive reorganization    Potential to Achieve Goals  Good    Consulted and Agree with Plan of Care  Patient       Patient will benefit from skilled therapeutic intervention in order to improve the following deficits and impairments:   Cognitive communication deficit    Problem List Patient Active Problem List   Diagnosis Date Noted  . 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 Right thyroid nodule 04/02/2017  . Hyperglycemia   . Poor nutrition   . Gastroesophageal reflux disease   . Hyperlipidemia   . Morbid obesity (HCC)   . History of stroke 02/12/2017  . Allergic rhinitis 11/07/2016  . BMI 39.0-39.9,adult 11/07/2016  . Essential hypertension 10/18/2016   Rondel Baton, MS, CCC-SLP Speech-Language Pathologist  [name] 05/04/2017, 9:06 AM  [facility] [address] [place], [place], [address] Phone: [phone]   Fax:  [phone]   Name: [name] MRN: [id number] Date of Birth: [birth year]

## 2017-05-05 ENCOUNTER — Encounter: Payer: Self-pay | Admitting: Neurology

## 2017-05-05 ENCOUNTER — Ambulatory Visit (INDEPENDENT_AMBULATORY_CARE_PROVIDER_SITE_OTHER): Payer: BLUE CROSS/BLUE SHIELD | Admitting: Neurology

## 2017-05-05 VITALS — BP 128/80 | HR 88 | Ht 72.0 in | Wt 267.0 lb

## 2017-05-05 DIAGNOSIS — G4719 Other hypersomnia: Secondary | ICD-10-CM

## 2017-05-05 DIAGNOSIS — R0683 Snoring: Secondary | ICD-10-CM | POA: Diagnosis not present

## 2017-05-05 DIAGNOSIS — R0681 Apnea, not elsewhere classified: Secondary | ICD-10-CM

## 2017-05-05 DIAGNOSIS — E669 Obesity, unspecified: Secondary | ICD-10-CM | POA: Diagnosis not present

## 2017-05-05 DIAGNOSIS — Z8673 Personal history of transient ischemic attack (TIA), and cerebral infarction without residual deficits: Secondary | ICD-10-CM | POA: Diagnosis not present

## 2017-05-05 NOTE — Progress Notes (Signed)
Subjective:    Patient ID: [name] is a 52 y.o. male.  HPI     Jesus Foley, MD, PhD [name] [address], [address] [address] [place], [place] [address]  Dear [name],   I saw your patient, [name], upon your kind request in my clinic today for initial consultation of his sleep disorder, in particular, concern for underlying obstructive sleep apnea. The patient is accompanied by his wife today. As you know, [name] is a 52 year old right-handed gentleman with an underlying medical history of stroke in January 2019, hyperlipidemia, allergies, abnormal coagulation profile, and obesity, who reports snoring and excessive daytime somnolence. Wife has noted pauses in his breathing while he is asleep. I reviewed your office note from 04/27/2017. His Epworth sleepiness score is 8 out of 24, fatigue score is 12 out of 63. He is married and lives with his wife. They have no children. He is a nonsmoker and drinks alcohol rarely, maybe once or twice per year, caffeine in the form of soda once a day. Bedtime is between 11 and MN. Not back to work. Works in Air[employer]. WT is 6:45 currently. Has 2 dogs in the bedroom. No nocturia. No RLS symptoms, no PLMs reported. No FHx of OSA. He has some residual weakness in the LUE. Otherwise doing better. No AM HAs. He is still in therapy. He denies morning headaches typically.  His Past Medical History Is Significant For: Past Medical History:  Diagnosis Date  . 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 Environmental allergies   . Fall from roof    with kidney trauma  . High cholesterol   . Hypertension 2018  . Stroke [facility]) 02/12/2017    His Past Surgical History Is Significant For: Past Surgical History:  Procedure Laterality Date  . LOOP RECORDER INSERTION N/A 02/16/2017   Procedure: LOOP RECORDER INSERTION;  Surgeon: Regan Lemming, MD;  Location: [facility] INVASIVE CV LAB;  Service: Cardiovascular;  Laterality:  N/A;  . NO PAST SURGERIES    . TEE WITHOUT CARDIOVERSION N/A 02/16/2017   Procedure: TRANSESOPHAGEAL ECHOCARDIOGRAM (TEE);  Surgeon: Jake Bathe, MD;  Location: [facility] ENDOSCOPY;  Service: Cardiovascular;  Laterality: N/A;    His Family History Is Significant For: Family History  Problem Relation Age of Onset  . Hypertension Mother   . Hypertension Father   . Stroke Father   . Hypertension Sister   . Leukemia Paternal Uncle     His Social History Is Significant For: Social History   Socioeconomic History  . Marital status: Married    Spouse name: [name]  . Number of children: 0  . Years of education: College  . Highest education level: Not on file  Occupational History  . Occupation: outside Airline pilot    Comment: [employer]  Social Needs  . Financial resource strain: Not on file  . Food insecurity:    Worry: Not on file    Inability: Not on file  . Transportation needs:    Medical: Not on file    Non-medical: Not on file  Tobacco Use  . Smoking status: Never Smoker  . Smokeless tobacco: Never Used  Substance and Sexual Activity  . Alcohol use: Yes    Alcohol/week: 0.0 oz    Comment: rarely, 1-2 times/year  . Drug use: No  . Sexual activity: Yes    Partners: Female  Lifestyle  . Physical activity:    Days per week: Not on file  Minutes per session: Not on file  . Stress: Not on file  Relationships  . Social connections:    Talks on phone: Not on file    Gets together: Not on file    Attends religious service: Not on file    Active member of club or organization: Not on file    Attends meetings of clubs or organizations: Not on file    Relationship status: Not on file  Other Topics Concern  . Not on file  Social History Narrative   Lives with his wife, [name] at home and 2 dogs.  Education [employer].  No children.  Employed by [employer].   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     His Allergies Are:  Allergies  Allergen Reactions  . Other      Strawberries and blueberries   . Penicillins Other (See Comments)    Headache Has patient had a PCN reaction causing immediate rash, facial/tongue/throat swelling, SOB or lightheadedness with hypotension: No Has patient had a PCN reaction causing severe rash involving mucus membranes or skin necrosis: No Has patient had a PCN reaction that required hospitalization: No Has patient had a PCN reaction occurring within the last 10 years:No If all of the above answers are "NO", then may proceed with Cephalosporin use.   :   His Current Medications Are:  Outpatient Encounter Medications as of 05/05/2017  Medication Sig  . acetaminophen (TYLENOL) 325 MG tablet Take 650 mg by mouth every 6 (six) hours as needed for mild pain.  Marland Kitchen amLODipine (NORVASC) 10 MG tablet Take 1 tablet (10 mg total) by mouth daily.  Marland Kitchen aspirin EC 325 MG EC tablet Take 1 tablet (325 mg total) by mouth daily.  Marland Kitchen atorvastatin (LIPITOR) 40 MG tablet Take 1 tablet (40 mg total) by mouth daily at 6 PM.  . cetirizine (ZYRTEC) 10 MG tablet Take 10 mg by mouth daily as needed for allergies.  . famotidine (PEPCID) 20 MG tablet Take 1 tablet (20 mg total) by mouth daily.  Marland Kitchen lisinopril (PRINIVIL,ZESTRIL) 20 MG tablet Take 1 tablet (20 mg total) by mouth daily.   No facility-administered encounter medications on file as of 05/05/2017.   :  Review of Systems:  Out of a complete 14 point review of systems, all are reviewed and negative with the exception of these symptoms as listed below: Review of Systems  Neurological:       Pt presents today to discuss his sleep. Pt has never had a sleep study but does endorse snoring.  Epworth Sleepiness Scale 0= would never doze 1= slight chance of dozing 2= moderate chance of dozing 3= high chance of dozing  Sitting and reading: 2 Watching TV: 2 Sitting inactive in a public place (ex. Theater or meeting): 1 As a passenger in a car for an hour without a break: 2 Lying down to rest in the  afternoon: 0 Sitting and talking to someone: 0 Sitting quietly after lunch (no alcohol): 1 In a car, while stopped in traffic: 0 Total: 8     Objective:  Neurological Exam  Physical Exam Physical Examination:   Vitals:   05/05/17 1114  BP: 128/80  Pulse: 88   General Examination: The patient is a very pleasant 52 y.o. male in no acute distress. He appears well-developed and well-nourished and well groomed.   HEENT: Normocephalic, atraumatic, pupils are equal, round and reactive to light and accommodation. Extraocular tracking is good without limitation to gaze excursion or nystagmus noted.  Normal smooth pursuit is noted. Hearing is grossly intact. Face is symmetric with normal facial animation and normal facial sensation. Speech is clear with no dysarthria noted. There is no hypophonia. There is no lip, neck/head, jaw or voice tremor. Neck is supple with full range of passive and active motion. There are no carotid bruits on auscultation. Oropharynx exam reveals: mild mouth dryness, adequate dental hygiene and moderate airway crowding, due to smaller airway, tonsils of 1+, larger uvula. Mallampati is class Li. Tongue protrudes centrally and palate elevates symmetrically. Neck size is 18 inches. He has a nearly absent overbite.   Chest: Clear to auscultation without wheezing, rhonchi or crackles noted.  Heart: S1+S2+0, regular and normal without murmurs, rubs or gallops noted.   Abdomen: Soft, non-tender and non-distended with normal bowel sounds appreciated on auscultation.  Extremities: There is trace pitting edema in the distal lower extremities bilaterally. Pedal pulses are intact.  Skin: Warm and dry without trophic changes noted.  Musculoskeletal: exam reveals no obvious joint deformities, tenderness or joint swelling or erythema.   Neurologically:  Mental status: The patient is awake, alert and oriented in all 4 spheres. His immediate and remote memory, attention, language  skills and fund of knowledge are appropriate. There is no evidence of aphasia, agnosia, apraxia or anomia. Speech is clear with normal prosody and enunciation. Thought process is linear. Mood is normal and affect is normal.  Cranial nerves II - XII are as described above under HEENT exam. In addition: shoulder shrug is normal with equal shoulder height noted. Motor exam: Normal bulk, strength and tone is noted, with the exception of minimal left grip strength weakness noted. There is no tremor. Romberg is negative. Reflexes are 2+. Fine motor skills and coordination: grossly intact.  Cerebellar testing: No dysmetria or intention tremor. There is no truncal or gait ataxia.  Sensory exam: intact to light touch in the upper and lower extremities.  Gait, station and balance: He stands without difficulty, is able to stand narrow based, he has difficulty with tandem walk and has a tendency to turn the left foot in more than the right. He has had this problem since childhood and used to have inserts and special shoes as a child he recalls.  Assessment and Plan:  In summary, [name] is a very pleasant 52 y.o.-year old male with an underlying medical history of stroke in January 2019,, hyperlipidemia, allergies, abnormal coagulation profile, and obesity, whose history and physical exam are concerning for obstructive sleep apnea (OSA). I had a long chat with the patient and his wife about my findings and the diagnosis of OSA, its prognosis and treatment options. We talked about medical treatments, surgical interventions and non-pharmacological approaches. I explained in particular the risks and ramifications of untreated moderate to severe OSA, especially with respect to developing cardiovascular disease down the Road, including congestive heart failure, difficult to treat hypertension, cardiac arrhythmias, or stroke. Even type 2 diabetes has, in part, been linked to untreated OSA. Symptoms of untreated OSA  include daytime sleepiness, memory problems, mood irritability and mood disorder such as depression and anxiety, lack of energy, as well as recurrent headaches, especially morning headaches. We talked about trying to maintain a healthy lifestyle in general, as well as the importance of weight control. I encouraged the patient to eat healthy, exercise daily and keep well hydrated, to keep a scheduled bedtime and wake time routine, to not skip any meals and eat healthy snacks in between meals. I advised the  patient not to drive when feeling sleepy. I recommended the following at this time: sleep study with potential positive airway pressure titration. (We will score hypopneas at 3%).   I explained the sleep test procedure to the patient and also outlined possible surgical and non-surgical treatment options of OSA, including the use of a custom-made dental device (which would require a referral to a specialist dentist or oral surgeon), upper airway surgical options, such as pillar implants, radiofrequency surgery, tongue base surgery, and UPPP (which would involve a referral to an ENT surgeon). Rarely, jaw surgery such as mandibular advancement may be considered.  I also explained the CPAP treatment option to the patient, who indicated that he would be willing to try CPAP if the need arises. I explained the importance of being compliant with PAP treatment, not only for insurance purposes but primarily to improve His symptoms, and for the patient's long term health benefit, including to reduce His cardiovascular risks. I answered all their questions today and the patient were in agreement. I would like to see him back after the sleep study is completed and encouraged him to call with any interim questions, concerns, problems or updates.   Thank you very much for allowing me to participate in the care of this nice patient. If I can be of any further assistance to you please do not hesitate to talk to me.    Sincerely,   Jesus Foley, MD, PhD

## 2017-05-05 NOTE — Patient Instructions (Addendum)

## 2017-05-07 ENCOUNTER — Ambulatory Visit: Payer: BLUE CROSS/BLUE SHIELD | Admitting: Speech Pathology

## 2017-05-07 DIAGNOSIS — R41841 Cognitive communication deficit: Secondary | ICD-10-CM

## 2017-05-07 DIAGNOSIS — M6281 Muscle weakness (generalized): Secondary | ICD-10-CM | POA: Diagnosis not present

## 2017-05-07 NOTE — Therapy (Signed)
[facility] [address] [place], [place], [address] Phone: [phone]   Fax:  [phone]  Speech Language Pathology Treatment and Discharge Summary  Patient Details  Name: [name] MRN: [id number] Date of Birth: 1965/09/02 Referring Provider: Dr. Delice Lesch   Encounter Date: 05/07/2017  End of Session - 05/07/17 0833    Visit Number  17    Number of Visits  17    Date for SLP Re-Evaluation  05/08/17    SLP Start Time  0805    SLP Stop Time   0845    SLP Time Calculation (min)  40 min    Activity Tolerance  Patient tolerated treatment well       Past Medical History:  Diagnosis Date  . Acute ischemic right MCA stroke ([place]) 02/17/2017  . Environmental allergies   . Fall from roof    with kidney trauma  . High cholesterol   . Hypertension 2018  . Stroke [facility]) 02/12/2017    Past Surgical History:  Procedure Laterality Date  . LOOP RECORDER INSERTION N/A 02/16/2017   Procedure: LOOP RECORDER INSERTION;  Surgeon: Constance Haw, MD;  Location: [facility];  Service: Cardiovascular;  Laterality: N/A;  . NO PAST SURGERIES    . TEE WITHOUT CARDIOVERSION N/A 02/16/2017   Procedure: TRANSESOPHAGEAL ECHOCARDIOGRAM (TEE);  Surgeon: Jerline Pain, MD;  Location: [facility];  Service: Cardiovascular;  Laterality: N/A;    There were no vitals filed for this visit.  Subjective Assessment - 05/07/17 0806    Subjective  "We worked on a schedule... and I typed everything up."    Currently in Pain?  No/denies            ADULT SLP TREATMENT - 05/07/17 0806      General Information   Behavior/Cognition  Alert;Cooperative    Patient Positioning  Upright in chair      Treatment Provided   Treatment provided  Cognitive-Linquistic      Pain Assessment   Pain Assessment  No/denies pain      Cognitive-Linquistic Treatment   Treatment focused on  Cognition    Skilled Treatment  Pt arrived with  a typed schedule of graduated return to work which he made in consultation with his HR manager. 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 SLP faciliated functional problem solving, giving pt a parts list and instructions to modify prices according to specific criteria. Pt demo'd adequate attention to detail, reviewing his work for errors when completed for 100% accuracy.       Assessment / Recommendations / Plan   Plan  Discharge SLP treatment due to (comment);All goals met pt pleased with current functional level      Progression Toward Goals   Progression toward goals  Progressing toward goals       SLP Education - 05/07/17 0854    Education provided  Yes    Education Details  continue double checking, use compensatory strategies when returning to work    [employer]) Educated  Patient    Methods  Explanation    Comprehension  Verbalized understanding;Returned demonstration         SLP Long Term Goals - 05/07/17 6659  SLP LONG TERM GOAL #1   Title  Pt will complete moderately complex time/money, reasoning/problem solving tasks with 90% accuracy    Period  Weeks    Status  Achieved      SLP LONG TERM GOAL #2   Title  Pt will identify and correct errors in cognitive linguistic tasks in 80% of opportunities over three sessions    Period  Weeks    Status  Achieved      SLP LONG TERM GOAL #3   Title  Pt will demo attention to detail necessary for 100% success in detailed linguistic tasks given self correction, over three sessions    Time  1    Period  Weeks    Status  Achieved       Plan - 05/07/17 1157    Clinical Impression Statement  Pt today  demonstrates adequate compensation for decreased attention to detail and organization. Plans to return to work on a graduated basis, arrived with typed plan for his return and able to name several strategies he plans to use to ensure successful return. During functional organization, problem solving tasks pt independently reviewed and checked for errors. Long term goals met; today pt in agreement with d/c reporting he is pleased with his current functional level.     Speech Therapy Frequency  2x / week d/c    Duration  -- d/c    Treatment/Interventions  Compensatory strategies;Patient/family education;SLP instruction and feedback;Functional tasks;Internal/external aids;Compensatory techniques;Cognitive reorganization    Potential to Achieve Goals  Good    Consulted and Agree with Plan of Care  Patient       Patient will benefit from skilled therapeutic intervention in order to improve the following deficits and impairments:   Cognitive communication deficit    Problem List Patient Active Problem List   Diagnosis Date Noted  . 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 Right thyroid nodule 04/02/2017  . Hyperglycemia   . Poor nutrition   . Gastroesophageal reflux disease   . Hyperlipidemia   . Morbid obesity ([place])   . History of stroke 02/12/2017  . Allergic rhinitis 11/07/2016  . BMI 39.0-39.9,adult 11/07/2016  . Essential hypertension 10/18/2016   SPEECH THERAPY DISCHARGE SUMMARY  Visits from Start of Care: 17  Current functional level related to goals / functional outcomes: Long-term goals met. Pt independently using compensatory strategies for attention and double checking work for errors. See clinical impressions for additional information.   Remaining deficits: Pt may have breakdowns in problem solving, organization, decreased attention to detail without use of compensatory techniques.    Education / Equipment: Compensations  for attention, strategies to improve success at work.   Plan: Patient agrees to discharge.  Patient goals were met. Patient is being discharged due to meeting the stated rehab goals.  ?????         Deneise Lever, [place], CCC-SLP Speech-Language Pathologist  Aliene Altes 05/07/2017, 8:55 AM  [facility] [address] [place], [place], [address] Phone: [phone]   Fax:  [phone]   Name: [name] MRN: [id number] Date of Birth: 05/29/1965

## 2017-05-12 ENCOUNTER — Other Ambulatory Visit: Payer: Self-pay

## 2017-05-12 ENCOUNTER — Ambulatory Visit (INDEPENDENT_AMBULATORY_CARE_PROVIDER_SITE_OTHER): Payer: BLUE CROSS/BLUE SHIELD | Admitting: Physician Assistant

## 2017-05-12 ENCOUNTER — Encounter: Payer: Self-pay | Admitting: Physician Assistant

## 2017-05-12 VITALS — BP 138/82 | HR 102 | Temp 98.5°F | Resp 16 | Ht 72.0 in | Wt 258.0 lb

## 2017-05-12 DIAGNOSIS — I69328 Other speech and language deficits following cerebral infarction: Secondary | ICD-10-CM

## 2017-05-12 DIAGNOSIS — I10 Essential (primary) hypertension: Secondary | ICD-10-CM | POA: Diagnosis not present

## 2017-05-12 DIAGNOSIS — I69359 Hemiplegia and hemiparesis following cerebral infarction affecting unspecified side: Secondary | ICD-10-CM

## 2017-05-12 DIAGNOSIS — E785 Hyperlipidemia, unspecified: Secondary | ICD-10-CM

## 2017-05-12 DIAGNOSIS — R739 Hyperglycemia, unspecified: Secondary | ICD-10-CM | POA: Diagnosis not present

## 2017-05-12 NOTE — Patient Instructions (Addendum)
Keep up the GREAT work!!!    IF you received an x-ray today, you will receive an invoice from [facility]. Please contact [facility] at [phone] with questions or concerns regarding your invoice.   IF you received labwork today, you will receive an invoice from [facility]. Please contact [facility] at [phone] with questions or concerns regarding your invoice.   Our billing staff will not be able to assist you with questions regarding bills from these companies.  You will be contacted with the lab results as soon as they are available. The fastest way to get your results is to activate your My Chart account. Instructions are located on the last page of this paperwork. If you have not heard from us regarding the results in 2 weeks, please contact this office.     

## 2017-05-12 NOTE — Assessment & Plan Note (Signed)
Await labs. Continue statin.

## 2017-05-12 NOTE — Assessment & Plan Note (Signed)
Update A1C today. 

## 2017-05-12 NOTE — Assessment & Plan Note (Signed)
Continues to improve. Continue management of HTN, lipids, glucose

## 2017-05-12 NOTE — Progress Notes (Signed)
Patient ID: [name], male    DOB: June 10, 1965, 52 y.o.   MRN: [id number]  PCP: Porfirio Oar, PA-C  Chief Complaint  Patient presents with  . Hypertension    follow up     Subjective:   Presents for evaluation of HTN.  Feels well. Continues to gain strength on the LEFT side, working with green putty to improve grip strength.  BP was normal at neurology 05/05/2017. Sleep study is scheduled for later this month.  Has not yet scheduled colonoscopy, but plans to once he has the sleep study.  Is ready to go back to work. Dr. Marjory Lies said that he can go back gradually. The patient has been driving, gradually more complex as advised.   Review of Systems  Constitutional: Negative for activity change, appetite change, fatigue and unexpected weight change.  HENT: Negative for congestion, dental problem, ear pain, hearing loss, mouth sores, postnasal drip, rhinorrhea, sneezing, sore throat, tinnitus and trouble swallowing.   Eyes: Negative for photophobia, pain, redness and visual disturbance.  Respiratory: Negative for cough, chest tightness and shortness of breath.   Cardiovascular: Negative for chest pain, palpitations and leg swelling.  Gastrointestinal: Negative for abdominal pain, blood in stool, constipation, diarrhea, nausea and vomiting.  Endocrine: Negative for cold intolerance, heat intolerance, polydipsia, polyphagia and polyuria.  Genitourinary: Negative for dysuria, frequency, hematuria and urgency.  Musculoskeletal: Negative for arthralgias, gait problem, myalgias and neck stiffness.  Skin: Negative for rash.  Neurological: Positive for weakness (LEFT side). Negative for dizziness, speech difficulty, light-headedness, numbness and headaches.  Hematological: Negative for adenopathy.  Psychiatric/Behavioral: Negative for confusion and sleep disturbance. The patient is not nervous/anxious.        Patient Active Problem List   Diagnosis Date Noted  .  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 Right thyroid nodule 04/02/2017  . Hyperglycemia   . Poor nutrition   . Gastroesophageal reflux disease   . Hyperlipidemia   . Morbid obesity ([facility])   . History of stroke 02/12/2017  . Allergic rhinitis 11/07/2016  . BMI 39.0-39.9,adult 11/07/2016  . Essential hypertension 10/18/2016     Prior to Admission medications   Medication Sig Start Date End Date Taking? Authorizing Provider  acetaminophen (TYLENOL) 325 MG tablet Take 650 mg by mouth every 6 (six) hours as needed for mild pain.   Yes [provider]  amLODipine (NORVASC) 10 MG tablet Take 1 tablet (10 mg total) by mouth daily. 02/27/17  Yes Marielys Trinidad, PA-C  aspirin EC 325 MG EC tablet Take 1 tablet (325 mg total) by mouth daily. 02/26/17  Yes Love, Evlyn Kanner, PA-C  atorvastatin (LIPITOR) 40 MG tablet Take 1 tablet (40 mg total) by mouth daily at 6 PM. 02/27/17  Yes Ladarion Munyon, PA-C  cetirizine (ZYRTEC) 10 MG tablet Take 10 mg by mouth daily as needed for allergies.   Yes [provider]  famotidine (PEPCID) 20 MG tablet Take 1 tablet (20 mg total) by mouth daily. 02/27/17  Yes Lakesia Dahle, PA-C  lisinopril (PRINIVIL,ZESTRIL) 20 MG tablet Take 1 tablet (20 mg total) by mouth daily. 02/27/17  Yes Porfirio Oar, PA-C     Allergies  Allergen Reactions  . Other     Strawberries and blueberries   . Penicillins Other (See Comments)    Headache Has patient had a PCN reaction causing immediate rash, facial/tongue/throat swelling, SOB or lightheadedness with hypotension: No Has patient had a PCN reaction  causing severe rash involving mucus membranes or skin necrosis: No Has patient had a PCN reaction that required hospitalization: No Has patient had a PCN reaction occurring within the last 10 years:No If all of the above answers are "NO", then may proceed with Cephalosporin use.        Objective:  Physical  Exam  Constitutional: He is oriented to person, place, and time. He appears well-developed and well-nourished. He is active and cooperative. No distress.  BP 138/82   Pulse (!) 102   Temp 98.5 F (36.9 C)   Resp 16   Ht 6' (1.829 m)   Wt 258 lb (117 kg)   SpO2 98%   BMI 34.99 kg/m   HENT:  Head: Normocephalic and atraumatic.  Right Ear: Hearing normal.  Left Ear: Hearing normal.  Eyes: Conjunctivae are normal. No scleral icterus.  Neck: Normal range of motion. Neck supple. No thyromegaly present.  Cardiovascular: Normal rate, regular rhythm and normal heart sounds.  Pulses:      Radial pulses are 2+ on the right side, and 2+ on the left side.  Pulmonary/Chest: Effort normal and breath sounds normal.  Trace non-pitting edema LEFT LE.  Lymphadenopathy:       Head (right side): No tonsillar, no preauricular, no posterior auricular and no occipital adenopathy present.       Head (left side): No tonsillar, no preauricular, no posterior auricular and no occipital adenopathy present.    He has no cervical adenopathy.       Right: No supraclavicular adenopathy present.       Left: No supraclavicular adenopathy present.  Neurological: He is alert and oriented to person, place, and time. No sensory deficit.  Skin: Skin is warm, dry and intact. No rash noted. No cyanosis or erythema. Nails show no clubbing.  Psychiatric: He has a normal mood and affect. His speech is normal and behavior is normal.    Wt Readings from Last 3 Encounters:  05/12/17 258 lb (117 kg)  05/05/17 267 lb (121.1 kg)  04/27/17 261 lb 9.6 oz (118.7 kg)       Assessment & Plan:   Problem List Items Addressed This Visit    Essential hypertension - Primary    Above goal today but controlled last week at Sleep Medicine visit. Continue current regimen, healthy lifestyle changes.      Relevant Orders   CBC with Differential/Platelet   Comprehensive metabolic panel   Hyperlipidemia    Await labs. Continue  statin.      Relevant Orders   Comprehensive metabolic panel   Lipid panel   Hyperglycemia    Update A1C today.      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 Continue management of HTN, lipids, glucose          Return in about 3 months (around 08/11/2017) for re-evaluation of blood pressure, cholesterol, hyperglycemia.   Fernande Brashelle S. Gilma Bessette, PA-C Primary Care at [facility]

## 2017-05-12 NOTE — Assessment & Plan Note (Signed)
Above goal today but controlled last week at Sleep Medicine visit. Continue current regimen, healthy lifestyle changes.

## 2017-05-13 LAB — COMPREHENSIVE METABOLIC PANEL
ALT: 31 IU/L (ref 0–44)
AST: 24 IU/L (ref 0–40)
Albumin/Globulin Ratio: 1.5 (ref 1.2–2.2)
Albumin: 4.6 g/dL (ref 3.5–5.5)
Alkaline Phosphatase: 124 IU/L — ABNORMAL HIGH (ref 39–117)
BUN/Creatinine Ratio: 14 (ref 9–20)
BUN: 16 mg/dL (ref 6–24)
Bilirubin Total: 1.6 mg/dL — ABNORMAL HIGH (ref 0.0–1.2)
CO2: 22 mmol/L (ref 20–29)
Calcium: 9.6 mg/dL (ref 8.7–10.2)
Chloride: 105 mmol/L (ref 96–106)
Creatinine, Ser: 1.13 mg/dL (ref 0.76–1.27)
GFR calc Af Amer: 87 mL/min/{1.73_m2} (ref >59)
GFR calc non Af Amer: 75 mL/min/{1.73_m2} (ref >59)
Globulin, Total: 3 g/dL (ref 1.5–4.5)
Glucose: 101 mg/dL — ABNORMAL HIGH (ref 65–99)
Potassium: 4.6 mmol/L (ref 3.5–5.2)
Sodium: 146 mmol/L — ABNORMAL HIGH (ref 134–144)
Total Protein: 7.6 g/dL (ref 6.0–8.5)

## 2017-05-13 LAB — CBC WITH DIFFERENTIAL/PLATELET
Basophils Absolute: 0 10*3/uL (ref 0.0–0.2)
Basos: 0 %
EOS (ABSOLUTE): 0.2 10*3/uL (ref 0.0–0.4)
Eos: 3 %
Hematocrit: 47.3 % (ref 37.5–51.0)
Hemoglobin: 16 g/dL (ref 13.0–17.7)
Immature Grans (Abs): 0 10*3/uL (ref 0.0–0.1)
Immature Granulocytes: 0 %
Lymphocytes Absolute: 2.7 10*3/uL (ref 0.7–3.1)
Lymphs: 39 %
MCH: 31 pg (ref 26.6–33.0)
MCHC: 33.8 g/dL (ref 31.5–35.7)
MCV: 92 fL (ref 79–97)
Monocytes Absolute: 0.7 10*3/uL (ref 0.1–0.9)
Monocytes: 10 %
Neutrophils Absolute: 3.3 10*3/uL (ref 1.4–7.0)
Neutrophils: 48 %
Platelets: 325 10*3/uL (ref 150–379)
RBC: 5.16 x10E6/uL (ref 4.14–5.80)
RDW: 13.3 % (ref 12.3–15.4)
WBC: 7 10*3/uL (ref 3.4–10.8)

## 2017-05-13 LAB — LIPID PANEL
Chol/HDL Ratio: 3.6 ratio (ref 0.0–5.0)
Cholesterol, Total: 116 mg/dL (ref 100–199)
HDL: 32 mg/dL — ABNORMAL LOW (ref >39)
LDL Calculated: 66 mg/dL (ref 0–99)
Triglycerides: 88 mg/dL (ref 0–149)
VLDL Cholesterol Cal: 18 mg/dL (ref 5–40)

## 2017-05-13 LAB — HEMOGLOBIN A1C
Est. average glucose Bld gHb Est-mCnc: 120 mg/dL
Hgb A1c MFr Bld: 5.8 % — ABNORMAL HIGH (ref 4.8–5.6)

## 2017-05-14 ENCOUNTER — Encounter: Payer: Self-pay | Admitting: Physical Medicine & Rehabilitation

## 2017-05-14 ENCOUNTER — Encounter
Payer: BLUE CROSS/BLUE SHIELD | Attending: Physical Medicine & Rehabilitation | Admitting: Physical Medicine & Rehabilitation

## 2017-05-14 VITALS — BP 123/87 | HR 82 | Ht 72.0 in | Wt 260.6 lb

## 2017-05-14 DIAGNOSIS — Z8673 Personal history of transient ischemic attack (TIA), and cerebral infarction without residual deficits: Secondary | ICD-10-CM | POA: Insufficient documentation

## 2017-05-14 DIAGNOSIS — I10 Essential (primary) hypertension: Secondary | ICD-10-CM | POA: Diagnosis not present

## 2017-05-14 DIAGNOSIS — G8112 Spastic hemiplegia affecting left dominant side: Secondary | ICD-10-CM | POA: Diagnosis not present

## 2017-05-14 DIAGNOSIS — I6990 Unspecified sequelae of unspecified cerebrovascular disease: Secondary | ICD-10-CM | POA: Diagnosis not present

## 2017-05-14 DIAGNOSIS — I69359 Hemiplegia and hemiparesis following cerebral infarction affecting unspecified side: Secondary | ICD-10-CM | POA: Diagnosis not present

## 2017-05-14 DIAGNOSIS — R911 Solitary pulmonary nodule: Secondary | ICD-10-CM | POA: Diagnosis not present

## 2017-05-14 DIAGNOSIS — E669 Obesity, unspecified: Secondary | ICD-10-CM | POA: Diagnosis not present

## 2017-05-14 LAB — HYPERCOAGULABLE PANEL, COMPREHENSIVE
Anticardiolipin Ab, IgG: 98 [GPL'U] — ABNORMAL HIGH
Anticardiolipin Ab, IgM: <10 [MPL'U]
Beta-2 Glycoprotein I, IgA: 33 SAU — ABNORMAL HIGH
Beta-2 Glycoprotein I, IgG: >150 SGU — ABNORMAL HIGH
Beta-2 Glycoprotein I, IgM: <10 SMU
Homocysteine: 15 umol/L

## 2017-05-14 LAB — ANA W/REFLEX IF POSITIVE
Anti JO-1: <0.2 AI (ref 0.0–0.9)
Anti Nuclear Antibody(ANA): POSITIVE — AB
Centromere Ab Screen: <0.2 AI (ref 0.0–0.9)
Chromatin Ab SerPl-aCnc: 0.4 AI (ref 0.0–0.9)
ENA RNP Ab: <0.2 AI (ref 0.0–0.9)
ENA SM Ab Ser-aCnc: <0.2 AI (ref 0.0–0.9)
ENA SSA (RO) Ab: <0.2 AI (ref 0.0–0.9)
ENA SSB (LA) Ab: <0.2 AI (ref 0.0–0.9)
Scleroderma SCL-70: <0.2 AI (ref 0.0–0.9)
dsDNA Ab: 33 IU/mL — ABNORMAL HIGH (ref 0–9)

## 2017-05-14 LAB — CYCLIC CITRUL PEPTIDE ANTIBODY, IGG/IGA: Cyclic Citrullin Peptide Ab: 5 units (ref 0–19)

## 2017-05-14 NOTE — Progress Notes (Addendum)
Subjective:    Patient ID: [name], male    DOB: 06-Dec-1965, 52 y.o.   MRN: [id number]  HPI  52 y.o. male with history of HTN presents for follow up for right insular, parietal and cerebellar infarcts.     Last clinic visit 03/13/17. Since that time, pt states he completed therapies.  He is seeing Neurology.  BP is controlled. He had a CT of his neck, which showed benign nodule. Denies falls.  Pain Inventory Average Pain 0 Pain Right Now 0 My pain is na  In the last 24 hours, has pain interfered with the following? General activity 0 Relation with others 0 Enjoyment of life 0 What TIME of day is your pain at its worst? na Sleep (in general) Good  Pain is worse with: na Pain improves with: na Relief from Meds: 0  Mobility walk without assistance ability to climb steps?  yes do you drive?  no  Function employed # of hrs/week 45hrs  Neuro/Psych No problems in this area  Prior Studies Any changes since last visit?  no  Physicians involved in your care Any changes since last visit?  no   Family History  Problem Relation Age of Onset  . Hypertension Mother   . Hypertension Father   . Stroke Father   . Hypertension Sister   . Leukemia Paternal Uncle    Social History   Socioeconomic History  . Marital status: Married    Spouse name: Vickie  . Number of children: 0  . Years of education: College  . Highest education level: Not on file  Occupational History  . Occupation: outside Airline pilot    Comment: [employer]  Social Needs  . Financial resource strain: Not on file  . Food insecurity:    Worry: Not on file    Inability: Not on file  . Transportation needs:    Medical: Not on file    Non-medical: Not on file  Tobacco Use  . Smoking status: Never Smoker  . Smokeless tobacco: Never Used  Substance and Sexual Activity  . Alcohol use: Yes    Alcohol/week: 0.0 oz    Comment: rarely, 1-2 times/year  . Drug use: No  . Sexual activity: Yes      Partners: Female  Lifestyle  . Physical activity:    Days per week: Not on file    Minutes per session: Not on file  . Stress: Not on file  Relationships  . Social connections:    Talks on phone: Not on file    Gets together: Not on file    Attends religious service: Not on file    Active member of club or organization: Not on file    Attends meetings of clubs or organizations: Not on file    Relationship status: Not on file  Other Topics Concern  . Not on file  Social History Narrative   Lives with his wife, Vickie at home and 2 dogs.  Education [employer].  No children.  Employed by [employer].   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    Past Surgical History:  Procedure Laterality Date  . LOOP RECORDER INSERTION N/A 02/16/2017   Procedure: LOOP RECORDER INSERTION;  Surgeon: Regan Lemming, MD;  Location: [facility];  Service: Cardiovascular;  Laterality: N/A;  . NO PAST SURGERIES    . TEE WITHOUT CARDIOVERSION N/A 02/16/2017   Procedure: TRANSESOPHAGEAL ECHOCARDIOGRAM (TEE);  Surgeon: Jake Bathe, MD;  Location:  [facility]OS[facility];  Service: Cardiovascular;  Laterality: N/A;   Past Medical History:  Diagnosis Date  . 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 Environmental allergies   . Fall from roof    with kidney trauma  . High cholesterol   . Hypertension 2018  . Stroke ([facility]) 02/12/2017   BP 123/87   Pulse 82   Ht 6' (1.829 m)   Wt 260 lb 9.6 oz (118.2 kg)   SpO2 98%   BMI 35.34 kg/m   Opioid Risk Score:   Fall Risk Score:  `1  Depression screen PHQ 2/9  [facility] 05/12/2017 04/03/2017 03/13/2017 02/27/2017 11/26/2016 11/07/2016 10/18/2016  Decreased Interest 0 0 0 0 0 0 0  Down, Depressed, Hopeless 0 0 0 0 0 0 0  PHQ - 2 Score 0 0 0 0 0 0 0    Review of Systems  HENT: Negative.   Eyes: Negative.   Respiratory: Negative.   Cardiovascular: Negative.   Gastrointestinal: Negative.   Endocrine: Negative.   Genitourinary: Negative.    Musculoskeletal: Negative.   Skin: Negative.   Allergic/Immunologic: Negative.   Neurological: Negative.   Hematological: Negative.   Psychiatric/Behavioral: Negative.   All other systems reviewed and are negative.     Objective:   Physical Exam Constitutional: He appears well-developed and Obese.  HENT: Normocephalic and atraumatic.  Eyes: EOMI. No discharge. Cardiovascular: RRR. No JVD. Respiratory: Breath sounds normal. Unlabored. GI: Bowel sounds are normal. He exhibits no distension.  Neurological: He is alert and oriented.  Mild left facial weakness with mild dysarthria.  Motor:  LUE/LLE: 5/5 proximal to distal  Skin: Skin is warm and dry.  Psychiatric: He has a normal mood and affect. His behavior is normal. Thought content normal.     Assessment & Plan:  52 y.o. male with history of HTN presents for hospital follow up after receiving CIR for right insular, parietal and cerebellar infarcts.    1. Left hemiparesis and functional deficits secondary to right insular, parietal and cerebellar infarcts   Completed therapies  Cont follow up with Neurology  Plans to return to work next week  2. HTN:    Controlled at present  3. ?RUL nodule:   Benign per patient  4. Obesity  Cont exercise and diet modification  States he is losing weight

## 2017-05-15 ENCOUNTER — Ambulatory Visit: Payer: BLUE CROSS/BLUE SHIELD | Admitting: Physical Medicine & Rehabilitation

## 2017-05-18 ENCOUNTER — Encounter: Payer: Self-pay | Admitting: Physician Assistant

## 2017-05-25 ENCOUNTER — Ambulatory Visit (INDEPENDENT_AMBULATORY_CARE_PROVIDER_SITE_OTHER): Payer: BLUE CROSS/BLUE SHIELD | Admitting: *Deleted

## 2017-05-25 ENCOUNTER — Other Ambulatory Visit: Payer: Self-pay

## 2017-05-25 DIAGNOSIS — I639 Cerebral infarction, unspecified: Secondary | ICD-10-CM

## 2017-05-25 NOTE — Patient Outreach (Signed)
Telephone outreach to patient to obtain mRS was successfully completed. mRS = 1 

## 2017-05-26 NOTE — Progress Notes (Signed)
Carelink Summary Report / Loop Recorder 

## 2017-05-26 NOTE — Addendum Note (Signed)
Addended by: Joycelyn SchmidPENUMALLI, Sui Kasparek R on: 05/26/2017 05:43 PM   Modules accepted: Orders

## 2017-05-27 ENCOUNTER — Telehealth: Payer: Self-pay | Admitting: *Deleted

## 2017-05-27 NOTE — Telephone Encounter (Signed)
-----   Message from Suanne MarkerVikram R Penumalli, MD sent at 05/26/2017  5:43 PM EDT ----- Labs are noted for persistent high anti-cardiolipin and B2-glycoprotein antibodies. This may indicate tendency to form blood clots and strokes. I recommend referral to hematology for hypercoagulable state and consideration of anti-coagulation. -VRP

## 2017-05-27 NOTE — Telephone Encounter (Signed)
Pt called back and I gave results of his labs per Dr. Marjory LiesPenumalli, relating to as per below.  Referral for hematology was placed. Pt informed me that his insurance will be gone at the end of the month due to his job letting him go.  [facility] and [facility] did not have any appt available at time frame.  She gav me number to call [facility] [phone].  I called and they need information faxed to them Attn: Lilian ComaJamie Fax [phone].  Fax confirmation received.

## 2017-05-27 NOTE — Telephone Encounter (Signed)
LMVM for pt on mobile )ok per DPR).  Labs are noted for persistent high anti-cardiolipin and B2-glycoprotein antibodies. This may indicate tendency to form blood clots and strokes. I recommend referral to hematology for hypercoagulable state and consideration of anti-coagulation. -VRP   I LMVM as Dr. Marjory LiesPenumalli did place referral to hematology yesterday and pt may receive call about this.  Pt to return call for questions.

## 2017-06-01 LAB — CUP PACEART REMOTE DEVICE CHECK
Date Time Interrogation Session: 20190311160606
Implantable Pulse Generator Implant Date: 20190107

## 2017-06-02 ENCOUNTER — Encounter: Payer: Self-pay | Admitting: Physician Assistant

## 2017-06-05 ENCOUNTER — Telehealth: Payer: Self-pay | Admitting: Physician Assistant

## 2017-06-05 ENCOUNTER — Ambulatory Visit (INDEPENDENT_AMBULATORY_CARE_PROVIDER_SITE_OTHER): Payer: BLUE CROSS/BLUE SHIELD | Admitting: Neurology

## 2017-06-05 DIAGNOSIS — G4719 Other hypersomnia: Secondary | ICD-10-CM

## 2017-06-05 DIAGNOSIS — G4733 Obstructive sleep apnea (adult) (pediatric): Secondary | ICD-10-CM | POA: Diagnosis not present

## 2017-06-05 DIAGNOSIS — R0681 Apnea, not elsewhere classified: Secondary | ICD-10-CM

## 2017-06-05 DIAGNOSIS — R0683 Snoring: Secondary | ICD-10-CM

## 2017-06-05 DIAGNOSIS — Z8673 Personal history of transient ischemic attack (TIA), and cerebral infarction without residual deficits: Secondary | ICD-10-CM

## 2017-06-05 DIAGNOSIS — E669 Obesity, unspecified: Secondary | ICD-10-CM

## 2017-06-05 NOTE — Telephone Encounter (Signed)
Patient needs provider statement completed by Chelle, I have completed the forms the best I could from his last OV notes. I have highlighted the areas I was not sure of. I will place the forms in Chelle's box on 06/05/17 please return to the FMLA/Disability desk within 5-7 business days. Thank you!

## 2017-06-08 ENCOUNTER — Inpatient Hospital Stay: Payer: BLUE CROSS/BLUE SHIELD | Attending: Hematology & Oncology | Admitting: Hematology & Oncology

## 2017-06-08 ENCOUNTER — Inpatient Hospital Stay: Payer: BLUE CROSS/BLUE SHIELD

## 2017-06-08 VITALS — BP 124/74 | HR 76 | Temp 97.8°F | Resp 17 | Wt 256.0 lb

## 2017-06-08 DIAGNOSIS — I69328 Other speech and language deficits following cerebral infarction: Secondary | ICD-10-CM

## 2017-06-08 DIAGNOSIS — I69359 Hemiplegia and hemiparesis following cerebral infarction affecting unspecified side: Secondary | ICD-10-CM

## 2017-06-08 DIAGNOSIS — I10 Essential (primary) hypertension: Secondary | ICD-10-CM | POA: Diagnosis not present

## 2017-06-08 DIAGNOSIS — E7800 Pure hypercholesterolemia, unspecified: Secondary | ICD-10-CM | POA: Insufficient documentation

## 2017-06-08 DIAGNOSIS — R760 Raised antibody titer: Secondary | ICD-10-CM | POA: Insufficient documentation

## 2017-06-08 DIAGNOSIS — Z8673 Personal history of transient ischemic attack (TIA), and cerebral infarction without residual deficits: Secondary | ICD-10-CM

## 2017-06-08 LAB — CBC WITH DIFFERENTIAL (CANCER CENTER ONLY)
Basophils Absolute: 0 10*3/uL (ref 0.0–0.1)
Basophils Relative: 0 %
Eosinophils Absolute: 0.1 10*3/uL (ref 0.0–0.5)
Eosinophils Relative: 1 %
HCT: 45.5 % (ref 38.7–49.9)
Hemoglobin: 16.1 g/dL (ref 13.0–17.1)
Lymphocytes Relative: 32 %
Lymphs Abs: 2.5 10*3/uL (ref 0.9–3.3)
MCH: 31.2 pg (ref 28.0–33.4)
MCHC: 35.4 g/dL (ref 32.0–35.9)
MCV: 88.2 fL (ref 82.0–98.0)
Monocytes Absolute: 0.6 10*3/uL (ref 0.1–0.9)
Monocytes Relative: 7 %
Neutro Abs: 4.7 10*3/uL (ref 1.5–6.5)
Neutrophils Relative %: 60 %
Platelet Count: 292 10*3/uL (ref 145–400)
RBC: 5.16 MIL/uL (ref 4.20–5.70)
RDW: 12.3 % (ref 11.1–15.7)
WBC Count: 7.8 10*3/uL (ref 4.0–10.0)

## 2017-06-08 LAB — CMP (CANCER CENTER ONLY)
ALT: 78 U/L — ABNORMAL HIGH (ref 10–47)
AST: 41 U/L — ABNORMAL HIGH (ref 11–38)
Albumin: 3.9 g/dL (ref 3.5–5.0)
Alkaline Phosphatase: 114 U/L — ABNORMAL HIGH (ref 26–84)
Anion gap: 8 (ref 5–15)
BUN: 12 mg/dL (ref 7–22)
CO2: 27 mmol/L (ref 18–33)
Calcium: 9.6 mg/dL (ref 8.0–10.3)
Chloride: 107 mmol/L (ref 98–108)
Creatinine: 1.1 mg/dL (ref 0.60–1.20)
Glucose, Bld: 125 mg/dL — ABNORMAL HIGH (ref 73–118)
Potassium: 3.7 mmol/L (ref 3.3–4.7)
Sodium: 142 mmol/L (ref 128–145)
Total Bilirubin: 1.6 mg/dL (ref 0.2–1.6)
Total Protein: 7.7 g/dL (ref 6.4–8.1)

## 2017-06-08 LAB — ANTITHROMBIN III: AntiThromb III Func: 114 % (ref 75–120)

## 2017-06-08 NOTE — Progress Notes (Signed)
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 New Patient (Initial Visit)  : I was told that I had abnormal blood work  HPI: [name] is a very nice 52 year old white male.  He has really no significant past medical history.  He does have some obesity.  He was put on high blood pressure medication late last year.  He does have some elevated cholesterol values.  In January of this year, he developed an acute CVA.  He was found to have blockage of a right MCA.  He has some left-sided weakness.  He was in the hospital for 2 weeks recovering.  He was given tPA in the ER.  This does seem to help.  He had an extensive hypercoagulable study.  For some reason, most of the tests were not done.  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  His beta-2 glycoprotein IgG was over 150.  He was placed on aspirin.  He was sent to rehab.  He is now home.  He was recently tested for sleep apnea.  There is a history of stroke in the family but this was in family members who are in their 32s and 35s.  He had a TEE that was negative.  He had Dopplers of his legs that were negative.  Because of the elevated anticardiolipin antibody level, it was felt that he needed to see hematology for this.  He does not smoke.  He really does not drink.  He also was placed on Lipitor.  I should also note that his homocystine was minimally elevated at 15.  He works out of the house.  He is trying to get a little more exercise.  He had lipid panel done a month ago.  His cholesterol was only 116.  His cholesterol/HDL ratio was 3.6.  He has had no past surgery.  He has had no change in bowel or bladder habits.  He has not yet had a colonoscopy.  There is been no cough or shortness of breath.  He has had no  visual changes.  He is swallowing okay.  I must say that he actually does look quite good.  He does have a implantable loop recorder.  As far as I can tell he is had no problems with atrial fibrillation.  He is not a vegetarian.  Overall, his performance status is ECOG 1.     Past Medical History:  Diagnosis Date  . 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 Environmental allergies   . Fall from roof    with kidney trauma  . High cholesterol   . Hypertension 2018  . Stroke [facility]) 02/12/2017  :  Past Surgical History:  Procedure Laterality Date  . LOOP RECORDER INSERTION N/A 02/16/2017   Procedure: LOOP RECORDER INSERTION;  Surgeon: Regan Lemming, MD;  Location: [facility] INVASIVE CV LAB;  Service: Cardiovascular;  Laterality: N/A;  . NO PAST SURGERIES    . TEE WITHOUT CARDIOVERSION N/A 02/16/2017   Procedure: TRANSESOPHAGEAL ECHOCARDIOGRAM (TEE);  Surgeon: Jake Bathe, MD;  Location: [facility] ENDOSCOPY;  Service: Cardiovascular;  Laterality: N/A;  :   Current Outpatient Medications:  .  acetaminophen (TYLENOL) 325 MG tablet, Take 650 mg by mouth every 6 (six) hours as needed for mild pain., Disp: , Rfl:  .  amLODipine (NORVASC) 10 MG tablet, Take 1 tablet (10 mg total) by mouth daily., Disp: 90 tablet, Rfl: 3 .  aspirin EC 325 MG EC tablet, Take 1 tablet (325 mg total) by mouth daily., Disp: 100 tablet, Rfl: 0 .  atorvastatin (LIPITOR) 40 MG tablet, Take 1 tablet (40 mg total) by mouth daily at 6 PM., Disp: 90 tablet, Rfl: 3 .  cetirizine (ZYRTEC) 10 MG tablet, Take 10 mg by mouth daily as needed for allergies., Disp: , Rfl:  .  famotidine (PEPCID) 20 MG tablet, Take 1 tablet (20 mg total) by mouth daily., Disp: 90 tablet, Rfl: 3 .  lisinopril (PRINIVIL,ZESTRIL) 20 MG tablet, Take 1 tablet (20 mg total) by mouth daily., Disp: 90 tablet, Rfl: 3:  :  Allergies  Allergen Reactions  . Other     Strawberries and blueberries   . Penicillins Other (See Comments)     Headache Has patient had a PCN reaction causing immediate rash, facial/tongue/throat swelling, SOB or lightheadedness with hypotension: No Has patient had a PCN reaction causing severe rash involving mucus membranes or skin necrosis: No Has patient had a PCN reaction that required hospitalization: No Has patient had a PCN reaction occurring within the last 10 years:No If all of the above answers are "NO", then may proceed with Cephalosporin use.   :  Family History  Problem Relation Age of Onset  . Hypertension Mother   . Hypertension Father   . Stroke Father   . Hypertension Sister   . Leukemia Paternal Uncle   :  Social History   Socioeconomic History  . Marital status: Married    Spouse name: [name]  . Number of children: 0  . Years of education: College  . Highest education level: Not on file  Occupational History  . Occupation: outside Airline pilot    Comment: [employer]  Social Needs  . Financial resource strain: Not on file  . Food insecurity:    Worry: Not on file    Inability: Not on file  . Transportation needs:    Medical: Not on file    Non-medical: Not on file  Tobacco Use  . Smoking status: Never Smoker  . Smokeless tobacco: Never Used  Substance and Sexual Activity  . Alcohol use: Yes    Alcohol/week: 0.0 oz    Comment: rarely, 1-2 times/year  . Drug use: No  . Sexual activity: Yes    Partners: Female  Lifestyle  . Physical activity:    Days per week: Not on file    Minutes per session: Not on file  . Stress: Not on file  Relationships  . Social connections:    Talks on phone: Not on file    Gets together: Not on file    Attends religious service: Not on file    Active member of club or organization: Not on file    Attends meetings of clubs or organizations: Not on file    Relationship status: Not on file  . Intimate partner violence:    Fear of current or ex partner: Not on file    Emotionally abused: Not on file    Physically abused:  Not on file    Forced sexual activity: Not on file  Other Topics Concern  . Not on file  Social History Narrative   Lives with his wife, [name] at home and 2 dogs.  Education [employer].  No children.  Employed by [employer].   Caffeine Coke once  daily.   :  Review of Systems  Constitutional: Negative.   HENT: Negative.   Eyes: Negative.   Respiratory: Negative.   Cardiovascular: Negative.   Gastrointestinal: Negative.   Genitourinary: Negative.   Musculoskeletal: Negative.   Skin: Negative.   Neurological: Positive for focal weakness.  Endo/Heme/Allergies: Negative.   Psychiatric/Behavioral: Negative.      Exam: Well-developed, obese white male in no obvious distress.  Vital signs show a temperature of 97.8.  Pulse 76.  Blood pressure 124/74.  Weight is 256 pounds.  Head neck exam shows no ocular or oral lesions.  There are no palpable cervical or supraclavicular lymph nodes.  Lungs are clear bilaterally.  Cardiac exam regular rate and rhythm with no murmurs, rubs or bruits.  Abdomen is soft.  He is obese.  He has no fluid wave.  There is no palpable liver or spleen tip.  Back exam shows no tenderness over the spine, ribs or hips.  Extremities shows may be some slight weakness over the left arm.  He has relatively good gait.  Neurological exam shows no obvious neurological deficits.  Skin exam shows no rashes.  I do not see any evidence of libido reticularis.  He has no ecchymoses. @   Recent Labs    06/08/17 1524  WBC 7.8  HGB 16.1  HCT 45.5  PLT 292   Recent Labs    06/08/17 1524  NA 142  K 3.7  CL 107  CO2 27  GLUCOSE 125*  BUN 12  CREATININE 1.10  CALCIUM 9.6    Blood smear review: None  Pathology: None    Assessment and Plan: Mr. Schippers is a very nice 52 year old white male.  He had a CVA.  This was in the right MCA.  He received TPA.  He seems to have improved nicely.  He was found to have markedly elevated IgG anticardiolipin antibodies  and IgG beta-2 glycoprotein.  It is hard to say whether or not he fits the criteria for the APS.  We are rechecking his anticardiolipin antibodies and beta-2 glycoprotein levels.  Not sure at all whether or not we need to put him on formal anticoagulation right now.  He is on full dose aspirin.  His risk factors are trying to be modified.  He is on Lipitor now.  He is on blood pressure medication.  He is very nice.  I spent a good 45 minutes with him.  Over half time was spent face-to-face.  We will have to see what our hypercoagulable studies come back at.  I will plan to get him back depending on what we find with our hypercoagulable studies.

## 2017-06-09 ENCOUNTER — Telehealth: Payer: Self-pay

## 2017-06-09 LAB — HOMOCYSTEINE: Homocysteine: 16 umol/L — ABNORMAL HIGH (ref 0.0–15.0)

## 2017-06-09 NOTE — Procedures (Signed)
PATIENT'S NAME:  [name], [name] DOB:      08-Jun-1965      MR#:    [id number]     DATE OF RECORDING: 06/05/2017 REFERRING M.D.:  Joycelyn Schmid, MD Study Performed:   Baseline Polysomnogram HISTORY: 52 year old man with a history of stroke in January 2019, hyperlipidemia, allergies, abnormal coagulation profile, and obesity, who reports snoring and excessive daytime somnolence. Wife has noted pauses in his breathing while he is asleep. The patient endorsed the Epworth Sleepiness Scale at 8/24 points.  The patient's weight 267 pounds with a height of 72 (inches), resulting in a BMI of 36.1 kg/m2. The patient's neck circumference measured 18 inches.  CURRENT MEDICATIONS: Tylenol, Norvasc, Aspirin, Lipitor, Zyrtec, Pepcid, Prinivil.   PROCEDURE:  This is a multichannel digital polysomnogram utilizing the Somnostar 11.2 system.  Electrodes and sensors were applied and monitored per AASM Specifications.   EEG, EOG, Chin and Limb EMG, were sampled at 200 Hz.  ECG, Snore and Nasal Pressure, Thermal Airflow, Respiratory Effort, CPAP Flow and Pressure, Oximetry was sampled at 50 Hz. Digital video and audio were recorded.      BASELINE STUDY  Lights Out was at 23:03 and Lights On at 05:11.  Total recording time (TRT) was 369 minutes, with a total sleep time (TST) of  339 minutes. The patient's sleep latency was 24 minutes. REM latency was 155 minutes, which is delayed. The sleep efficiency was 91.9 %.     SLEEP ARCHITECTURE: WASO (Wake after sleep onset) was 5.5 minutes.  There were 11.5 minutes in Stage N1, 178.5 minutes Stage N2, 104 minutes Stage N3 and 45 minutes in Stage REM.  The percentage of Stage N1 was 3.4%, Stage N2 was 52.7%, which is normal, Stage N3 was 30.7%, which is increased, and Stage R (REM sleep) was 13.3%, which is reduced. The arousals were noted as: 43 were spontaneous, 0 were associated with PLMs, 44 were associated with respiratory events.  RESPIRATORY ANALYSIS:  There were a total of  169 respiratory events:  85 obstructive apneas, 0 central apneas and 0 mixed apneas with a total of 85 apneas and an apnea index (AI) of 15. /hour. There were 84 hypopneas with a hypopnea index of 14.9 /hour. The patient also had 0 respiratory event related arousals (RERAs).      The total APNEA/HYPOPNEA INDEX (AHI) was 29.9/hour and the total RESPIRATORY DISTURBANCE INDEX was 0. 29.9 /hour.  58 events occurred in REM sleep and 86 events in NREM. The REM AHI was 77.3/hour, versus a non-REM AHI of 22.7. The patient spent 172.5 minutes of total sleep time in the supine position and 167 minutes in non-supine.. The supine AHI was 52.8 versus a non-supine AHI of 6.1.  OXYGEN SATURATION & C02:  The Wake baseline 02 saturation was 97%, with the lowest being 81%. Time spent below 89% saturation equaled 33 minutes.  PERIODIC LIMB MOVEMENTS: The patient had a total of 0 Periodic Limb Movements.  The Periodic Limb Movement (PLM) index was 0 and the PLM Arousal index was 0/hour.  Audio and video analysis did not show any abnormal or unusual movements, behaviors, phonations or vocalizations. The patient took no bathroom breaks. Moderate snoring was noted. The EKG was in keeping with normal sinus rhythm (NSR).  Post-study, the patient indicated that sleep was about the same as usual.   IMPRESSION:  1. Obstructive Sleep Apnea (OSA)  RECOMMENDATIONS:  1. This study demonstrates severe obstructive sleep apnea, with a total AHI of 29.9/hour, REM AHI  of 77.3/hour, supine AHI of 52.8/hour and O2 nadir of 81%. Treatment with positive airway pressure in the form of CPAP is recommended. This will require a full night titration study to optimize therapy. Other treatment options may include avoidance of supine sleep position along with weight loss, upper airway or jaw surgery in selected patients or the use of an oral appliance in certain patients. ENT evaluation and/or consultation with a maxillofacial surgeon or dentist  may be feasible in some instances.    2. Please note that untreated obstructive sleep apnea carries additional perioperative morbidity. Patients with significant obstructive sleep apnea should receive perioperative PAP therapy and the surgeons and particularly the anesthesiologist should be informed of the diagnosis and the severity of the sleep disordered breathing. 3. The patient should be cautioned not to drive, work at heights, or operate dangerous or heavy equipment when tired or sleepy. Review and reiteration of good sleep hygiene measures should be pursued with any patient. 4. The patient will be seen in follow-up by Dr. Frances Furbish at [facility] for discussion of the test results and further management strategies. The referring provider will be notified of the test results.  I certify that I have reviewed the entire raw data recording prior to the issuance of this report in accordance with the Standards of Accreditation of the American Academy of Sleep Medicine (AASM)   [name], MD, PhD Diplomat, American Board of Psychiatry and Neurology (Neurology and Sleep Medicine)_

## 2017-06-09 NOTE — Telephone Encounter (Signed)
We called pt back and explained to him that we are unable to file with his insurance since the insurance elapses today. Pt verbalized understanding. Pt will call us back when he has new insurance.

## 2017-06-09 NOTE — Telephone Encounter (Signed)
Completed form and returned to disability/FMLA desk.

## 2017-06-09 NOTE — Telephone Encounter (Signed)
I called pt. I advised pt that Dr. Frances Furbish reviewed their sleep study results and found that pt has severe osa with an O2 nadir of 81% and recommends that pt be treated with a cpap. Dr. Frances Furbish recommends that pt return for a repeat sleep study in order to properly titrate the cpap and ensure a good mask fit. Pt is agreeable to returning for a titration study. I advised pt that our sleep lab will file with pt's insurance and call pt to schedule the sleep study when we hear back from the pt's insurance regarding coverage of this sleep study. Pt reports that today is his last day of BCBS coverage, and he will have to shop around for new insurance since he just lost his job. I advised pt that I will let our sleep lab know this and maybe we can figure something out for the pt. Pt verbalized understanding of results. Pt had no questions at this time but was encouraged to call back if questions arise.

## 2017-06-09 NOTE — Telephone Encounter (Signed)
OK, can you call him and discuss this?

## 2017-06-09 NOTE — Progress Notes (Signed)
Patient referred by Dr. Marjory Lies, seen by me on 05/05/17, diagnostic PSG on 06/05/17.   Please call and notify the patient that the recent sleep study showed severe obstructive sleep apnea, with a total AHI of 29.9/hour, REM AHI of 77.3/hour, supine AHI of 52.8/hour and O2 nadir of 81%. I recommend treatment for this in the form of CPAP (esp in light of stroke Hx at a young age). This will require a repeat sleep study for proper titration and mask fitting and correct monitoring of the oxygen saturations. Please explain to patient. I have placed an order in the chart. Thanks.  [name], MD, PhD [facility])

## 2017-06-09 NOTE — Addendum Note (Signed)
Addended by: [name] on: 06/09/2017 08:14 AM   Modules accepted: Orders

## 2017-06-09 NOTE — Telephone Encounter (Signed)
It will not make a difference. If we submit it today his insurance will be expired by the date of service. Therefore, [employer] will not pay for the cpap titration due to pt not being insured. Pt will have to call us back once he is established with a new insurance company. Then we can get PA and get him scheduled for cpap titration study.

## 2017-06-09 NOTE — Telephone Encounter (Signed)
-----   Message from [name], MD sent at 06/09/2017  8:14 AM EDT ----- Patient referred by Dr. Marjory Lies, seen by me on 05/05/17, diagnostic PSG on 06/05/17.   Please call and notify the patient that the recent sleep study showed severe obstructive sleep apnea, with a total AHI of 29.9/hour, REM AHI of 77.3/hour, supine AHI of 52.8/hour and O2 nadir of 81%. I recommend treatment for this in the form of CPAP (esp in light of stroke Hx at a young age). This will require a repeat sleep study for proper titration and mask fitting and correct monitoring of the oxygen saturations. Please explain to patient. I have placed an order in the chart. Thanks.  [name], MD, PhD [facility])

## 2017-06-09 NOTE — Telephone Encounter (Signed)
I called pt to discuss his sleep study results. No answer, left a message asking him to call me back. 

## 2017-06-10 LAB — BETA-2-GLYCOPROTEIN I ABS, IGG/M/A
Beta-2 Glyco I IgG: 116 GPI IgG units — ABNORMAL HIGH (ref 0–20)
Beta-2-Glycoprotein I IgA: 18 GPI IgA units (ref 0–25)
Beta-2-Glycoprotein I IgM: <9 GPI IgM units (ref 0–32)

## 2017-06-10 LAB — PROTEIN C ACTIVITY: Protein C Activity: 108 % (ref 73–180)

## 2017-06-10 LAB — PROTEIN S ACTIVITY: Protein S Activity: 104 % (ref 63–140)

## 2017-06-10 LAB — PROTEIN S, TOTAL: Protein S Ag, Total: 91 % (ref 60–150)

## 2017-06-10 LAB — PROTEIN C, TOTAL: Protein C, Total: 109 % (ref 60–150)

## 2017-06-10 MED ORDER — FOLIC ACID 1 MG PO TABS: 1.0000 mg | ORAL_TABLET | Freq: Every day | ORAL | 6 refills | Status: DC

## 2017-06-10 NOTE — Telephone Encounter (Signed)
Paperwork scanned and faxed on 06/10/17

## 2017-06-10 NOTE — Addendum Note (Signed)
Addended by: Arlan Organ R on: 06/10/2017 06:40 AM   Modules accepted: Orders

## 2017-06-11 LAB — CARDIOLIPIN ANTIBODIES, IGG, IGM, IGA
Anticardiolipin IgA: <9 APL U/mL (ref 0–11)
Anticardiolipin IgG: 37 GPL U/mL — ABNORMAL HIGH (ref 0–14)
Anticardiolipin IgM: <9 MPL U/mL (ref 0–12)

## 2017-06-11 LAB — LUPUS ANTICOAGULANT PANEL
DRVVT: 84.7 s — ABNORMAL HIGH (ref 0.0–47.0)
PTT Lupus Anticoagulant: 52.5 s — ABNORMAL HIGH (ref 0.0–51.9)

## 2017-06-11 LAB — DRVVT CONFIRM: dRVVT Confirm: 2 ratio — ABNORMAL HIGH (ref 0.8–1.2)

## 2017-06-11 LAB — DRVVT MIX: dRVVT Mix: 47.8 s — ABNORMAL HIGH (ref 0.0–47.0)

## 2017-06-11 LAB — PTT-LA INCUB MIX: PTT-LA Incub Mix: 53.8 s — ABNORMAL HIGH (ref 0.0–48.9)

## 2017-06-11 LAB — PTT-LA MIX: PTT-LA Mix: 46 s (ref 0.0–48.9)

## 2017-06-11 LAB — HEXAGONAL PHASE PHOSPHOLIPID: Hexagonal Phase Phospholipid: 3 s (ref 0–11)

## 2017-06-12 LAB — FACTOR 5 LEIDEN

## 2017-06-15 LAB — PROTHROMBIN GENE MUTATION

## 2017-06-18 LAB — JAK 2 V617F (GENPATH)

## 2017-06-25 ENCOUNTER — Ambulatory Visit (INDEPENDENT_AMBULATORY_CARE_PROVIDER_SITE_OTHER): Payer: BLUE CROSS/BLUE SHIELD | Admitting: *Deleted

## 2017-06-25 DIAGNOSIS — I639 Cerebral infarction, unspecified: Secondary | ICD-10-CM | POA: Diagnosis not present

## 2017-06-26 NOTE — Progress Notes (Signed)
Carelink Summary Report / Loop Recorder 

## 2017-06-29 LAB — CUP PACEART REMOTE DEVICE CHECK
Date Time Interrogation Session: 20190413160829
Implantable Pulse Generator Implant Date: 20190107

## 2017-07-17 ENCOUNTER — Encounter: Payer: Self-pay | Admitting: Physician Assistant

## 2017-07-17 LAB — CUP PACEART REMOTE DEVICE CHECK
Date Time Interrogation Session: 20190516160838
Implantable Pulse Generator Implant Date: 20190107

## 2017-07-22 ENCOUNTER — Ambulatory Visit: Payer: BLUE CROSS/BLUE SHIELD | Admitting: Physician Assistant

## 2017-07-22 ENCOUNTER — Other Ambulatory Visit: Payer: Self-pay

## 2017-07-22 ENCOUNTER — Encounter: Payer: Self-pay | Admitting: Physician Assistant

## 2017-07-22 VITALS — BP 130/72 | HR 86 | Temp 98.0°F | Ht 71.0 in | Wt 248.8 lb

## 2017-07-22 DIAGNOSIS — R600 Localized edema: Secondary | ICD-10-CM

## 2017-07-22 DIAGNOSIS — I69359 Hemiplegia and hemiparesis following cerebral infarction affecting unspecified side: Secondary | ICD-10-CM | POA: Diagnosis not present

## 2017-07-22 DIAGNOSIS — I69328 Other speech and language deficits following cerebral infarction: Secondary | ICD-10-CM | POA: Diagnosis not present

## 2017-07-22 NOTE — Patient Instructions (Addendum)
1. Call Dr. Gustavo LahEnnever's office to clarify when/if you need to follow up. 2. Keep working with the putty in your LEFT hand. Practicing typing can help, too (remember the lessons you learned in typing class, or check out [email or web address]) 3. Compression stockings (knee high), on QAM, off QHS. [employer] [address], [place], [place] [id number] [phone] 4. INCREASE the water you drink to 64 ounces daily (Aim for 4 bottles). 5. Go ahead and call [facility] to schedule your next visit with me there. [phone].   IF you received an x-ray today, you will receive an invoice from [facility]. Please contact [facility] at [phone] with questions or concerns regarding your invoice.   IF you received labwork today, you will receive an invoice from [facility]. Please contact [facility] at [phone] with questions or concerns regarding your invoice.   Our billing staff will not be able to assist you with questions regarding bills from these companies.  You will be contacted with the lab results as soon as they are available. The fastest way to get your results is to activate your My Chart account. Instructions are located on the last page of this paperwork. If you have not heard from us regarding the results in 2 weeks, please contact this office.

## 2017-07-22 NOTE — Progress Notes (Signed)
Patient ID: [name], male    DOB: Jun 10, 1965, 52 y.o.   MRN: [id number]  PCP: Porfirio OarJeffery, Xiong Haidar, PA-C  Chief Complaint  Patient presents with  . left side swelling    knee down to ankles. left hand ring finger and pinky feels weird    Subjective:   Presents for evaluation of swelling of the left side, specifically lower leg.  Also left hand paresthesias.  He sent an e-mail on 07/17/2017, reporting swelling in the LEFT ankle, worse with walking, or other activities requiring he be on his feet.  During his hospitalization, he has significant swelling on the LEFT. The leg feels like it's asleep from the knee to the foot. If he can sit with the legs elevated, and in the mornings, the swelling is resolved. Has increased his water to 2-3 (16.9 ounce) bottles/day, and also drinks a juice once a day and 1 soda (caffeinated)/day. Doesn't add salt to his food, but does eat some pre-prepared foods/packaged foods. Mostly cooks from scratch at home. Hasn't noticed a difference on days he eats out.  4th and 5th fingers on the LEFT hand "are not working like they used to." Has slowed his typing, and notes he has to look at the keyboard. Recalls that he struggled with the "peg test" with the LEFT hand during his hospitalization, but passed it 2 weeks later as an outpatient. Has resumed using the putty again.  After his last visit, he returned to work, to be told that the company was "going in a different direction." He was let go after 22 years with the company. He has been looking for work since then.  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 Additional labs were performed, and folic acid was started. No follow-up visit is scheduled.     Review of Systems As above. No chest pain, shortness of breath, blurred vision, dizziness. No headache, fever,  chills. No nausea, vomiting, diarrhea. No urinary urgency, frequency or burning.    Patient Active Problem List   Diagnosis Date Noted  . 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 Right thyroid nodule 04/02/2017  . Hyperglycemia   . Poor nutrition   . Gastroesophageal reflux disease   . Hyperlipidemia   . Morbid obesity (HCC)   . History of stroke 02/12/2017  . Allergic rhinitis 11/07/2016  . BMI 39.0-39.9,adult 11/07/2016  . Essential hypertension 10/18/2016     Prior to Admission medications   Medication Sig Start Date End Date Taking? Authorizing Provider  acetaminophen (TYLENOL) 325 MG tablet Take 650 mg by mouth every 6 (six) hours as needed for mild pain.   Yes [provider]  amLODipine (NORVASC) 10 MG tablet Take 1 tablet (10 mg total) by mouth daily. 02/27/17  Yes Jonathyn Carothers, PA-C  aspirin EC 325 MG EC tablet Take 1 tablet (325 mg total) by mouth daily. 02/26/17  Yes Love, Evlyn KannerPamela S, PA-C  atorvastatin (LIPITOR) 40 MG tablet Take 1 tablet (40 mg total) by mouth daily at 6 PM. 02/27/17  Yes Ronesha Heenan, PA-C  cetirizine (ZYRTEC) 10 MG tablet Take 10 mg by mouth daily as needed for allergies.   Yes [provider]  famotidine (PEPCID) 20 MG tablet Take 1 tablet (20 mg total) by mouth daily. 02/27/17  Yes Brannon Decaire, PA-C  folic acid (FOLVITE) 1 MG tablet  Take 1 tablet (1 mg total) by mouth daily. 06/10/17  Yes [name], MD  lisinopril (PRINIVIL,ZESTRIL) 20 MG tablet Take 1 tablet (20 mg total) by mouth daily. 02/27/17  Yes Porfirio Oar, PA-C     Allergies  Allergen Reactions  . Other     Strawberries and blueberries   . Penicillins Other (See Comments)    Headache Has patient had a PCN reaction causing immediate rash, facial/tongue/throat swelling, SOB or lightheadedness with hypotension: No Has patient had a PCN reaction causing severe rash involving mucus membranes or skin necrosis:  No Has patient had a PCN reaction that required hospitalization: No Has patient had a PCN reaction occurring within the last 10 years:No If all of the above answers are "NO", then may proceed with Cephalosporin use.        Objective:  Physical Exam  Constitutional: He is oriented to person, place, and time. He appears well-developed and well-nourished. He is active and cooperative. No distress.  BP 130/72 (BP Location: Left Arm, Patient Position: Sitting, Cuff Size: Normal)   Pulse 86   Temp 98 F (36.7 C) (Oral)   Ht 5\' 11"  (1.803 m)   Wt 248 lb 12.8 oz (112.9 kg)   SpO2 98%   BMI 34.70 kg/m   HENT:  Head: Normocephalic and atraumatic.  Right Ear: Hearing normal.  Left Ear: Hearing normal.  Eyes: Conjunctivae are normal. No scleral icterus.  Neck: Normal range of motion. Neck supple. No thyromegaly present.  Cardiovascular: Normal rate, regular rhythm and normal heart sounds.  Pulses:      Radial pulses are 2+ on the right side, and 2+ on the left side.  Bilateral lower extremity edema, trace on the right, 1+ on the left.  Pulmonary/Chest: Effort normal and breath sounds normal.  Lymphadenopathy:       Head (right side): No tonsillar, no preauricular, no posterior auricular and no occipital adenopathy present.       Head (left side): No tonsillar, no preauricular, no posterior auricular and no occipital adenopathy present.    He has no cervical adenopathy.       Right: No supraclavicular adenopathy present.       Left: No supraclavicular adenopathy present.  Neurological: He is alert and oriented to person, place, and time. He displays normal reflexes. No cranial nerve deficit or sensory deficit. He exhibits normal muscle tone. Coordination normal.  Skin: Skin is warm, dry and intact. No rash noted. No cyanosis or erythema. Nails show no clubbing.  Psychiatric: He has a normal mood and affect. His speech is normal and behavior is normal.     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       Other Visit Diagnoses    Lower extremity edema    -  Primary   Likely due to right-sided stroke and related to left-sided hemiparesis.  Home exercise therapies.  Compression stocking.   Relevant Orders   For home use only DME Other see comment  Return in about 2 months (around 09/21/2017) for re-evalaution of swelling and blood pressure.   Fernande Bras, PA-C Primary Care at [facility]

## 2017-07-23 ENCOUNTER — Other Ambulatory Visit: Payer: Self-pay | Admitting: Hematology & Oncology

## 2017-07-23 DIAGNOSIS — Z8673 Personal history of transient ischemic attack (TIA), and cerebral infarction without residual deficits: Secondary | ICD-10-CM

## 2017-07-24 NOTE — Assessment & Plan Note (Signed)
Continue home therapies and follow-up with specialty care as recommended.

## 2017-07-28 ENCOUNTER — Ambulatory Visit (INDEPENDENT_AMBULATORY_CARE_PROVIDER_SITE_OTHER): Payer: Self-pay | Admitting: *Deleted

## 2017-07-28 DIAGNOSIS — I639 Cerebral infarction, unspecified: Secondary | ICD-10-CM

## 2017-07-29 NOTE — Progress Notes (Signed)
Carelink Summary Report / Loop Recorder 

## 2017-08-07 ENCOUNTER — Inpatient Hospital Stay: Payer: BLUE CROSS/BLUE SHIELD | Attending: Hematology & Oncology | Admitting: Hematology & Oncology

## 2017-08-07 ENCOUNTER — Inpatient Hospital Stay: Payer: BLUE CROSS/BLUE SHIELD

## 2017-08-07 ENCOUNTER — Encounter: Payer: Self-pay | Admitting: Hematology & Oncology

## 2017-08-07 ENCOUNTER — Other Ambulatory Visit: Payer: Self-pay

## 2017-08-07 VITALS — BP 138/77 | HR 76 | Temp 98.6°F | Resp 18 | Wt 250.0 lb

## 2017-08-07 DIAGNOSIS — Z7982 Long term (current) use of aspirin: Secondary | ICD-10-CM | POA: Diagnosis not present

## 2017-08-07 DIAGNOSIS — Z7901 Long term (current) use of anticoagulants: Secondary | ICD-10-CM

## 2017-08-07 DIAGNOSIS — Z8673 Personal history of transient ischemic attack (TIA), and cerebral infarction without residual deficits: Secondary | ICD-10-CM

## 2017-08-07 DIAGNOSIS — D6861 Antiphospholipid syndrome: Secondary | ICD-10-CM | POA: Insufficient documentation

## 2017-08-07 LAB — CBC WITH DIFFERENTIAL (CANCER CENTER ONLY)
Basophils Absolute: 0 10*3/uL (ref 0.0–0.1)
Basophils Relative: 0 %
Eosinophils Absolute: 0.1 10*3/uL (ref 0.0–0.5)
Eosinophils Relative: 2 %
HCT: 44 % (ref 38.7–49.9)
Hemoglobin: 15.2 g/dL (ref 13.0–17.1)
Lymphocytes Relative: 30 %
Lymphs Abs: 2.1 10*3/uL (ref 0.9–3.3)
MCH: 30.8 pg (ref 28.0–33.4)
MCHC: 34.5 g/dL (ref 32.0–35.9)
MCV: 89.2 fL (ref 82.0–98.0)
Monocytes Absolute: 0.9 10*3/uL (ref 0.1–0.9)
Monocytes Relative: 13 %
Neutro Abs: 3.9 10*3/uL (ref 1.5–6.5)
Neutrophils Relative %: 55 %
Platelet Count: 271 10*3/uL (ref 145–400)
RBC: 4.93 MIL/uL (ref 4.20–5.70)
RDW: 12.5 % (ref 11.1–15.7)
WBC Count: 6.9 10*3/uL (ref 4.0–10.0)

## 2017-08-07 LAB — D-DIMER, QUANTITATIVE: D-Dimer, Quant: <0.27 ug/mL-FEU (ref 0.00–0.50)

## 2017-08-07 NOTE — Progress Notes (Signed)
Hematology and Oncology Follow Up Visit  [name] [id number] [birth year] 52 y.o. 08/07/2017   Principle Diagnosis:   Anti-cardiolipin antibody syndrome-CVA  Current Therapy:    Aspirin 325 mg p.o. Daily  Folic acid 2 mg p.o. daily     Interim History:  Mr. [name] is back for second office visit.  We saw him at the end of April.  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  We retested his anticardiolipin antibodies.  His IgA anticardiolipin antibody was 37.  Today, it is 3563.  His beta-2 glycoprotein IgG level was greater than 150.  Today, it is 116.  He does have an elevated homocystine level.  I will start him on folic acid.  He is feeling pretty well.  He has had no problems with headache.  He has had no chest pain.  He has had no bleeding.  He has had no change in bowel or bladder habits.  Of note, he also is on Lipitor.  He has had no leg swelling.  Overall, his performance status is ECOG 1.  Medications:  Current Outpatient Medications:  .  acetaminophen (TYLENOL) 325 MG tablet, Take 650 mg by mouth every 6 (six) hours as needed for mild pain., Disp: , Rfl:  .  amLODipine (NORVASC) 10 MG tablet, Take 1 tablet (10 mg total) by mouth daily., Disp: 90 tablet, Rfl: 3 .  aspirin EC 325 MG EC tablet, Take 1 tablet (325 mg total) by mouth daily., Disp: 100 tablet, Rfl: 0 .  atorvastatin (LIPITOR) 40 MG tablet, Take 1 tablet (40 mg total) by mouth daily at 6 PM., Disp: 90 tablet, Rfl: 3 .  cetirizine (ZYRTEC) 10 MG tablet, Take 10 mg by mouth daily as needed for allergies., Disp: , Rfl:  .  famotidine (PEPCID) 20 MG tablet, Take 1 tablet (20 mg total) by mouth daily., Disp: 90 tablet, Rfl: 3 .  folic acid (FOLVITE) 1 MG tablet, Take 1 tablet (1 mg total) by mouth daily., Disp: 90 tablet, Rfl: 6 .  lisinopril (PRINIVIL,ZESTRIL) 20 MG tablet, Take 1 tablet (20 mg total) by mouth daily., Disp: 90 tablet, Rfl: 3  Allergies:  Allergies  Allergen  Reactions  . Other     Strawberries and blueberries   . Penicillins Other (See Comments)    Headache Has patient had a PCN reaction causing immediate rash, facial/tongue/throat swelling, SOB or lightheadedness with hypotension: No Has patient had a PCN reaction causing severe rash involving mucus membranes or skin necrosis: No Has patient had a PCN reaction that required hospitalization: No Has patient had a PCN reaction occurring within the last 10 years:No If all of the above answers are "NO", then may proceed with Cephalosporin use.     Past Medical History, Surgical history, Social history, and Family History were reviewed and updated.  Review of Systems: Review of Systems  Constitutional: Negative.   HENT:  Negative.   Eyes: Negative.   Respiratory: Negative.   Cardiovascular: Negative.   Gastrointestinal: Negative.   Endocrine: Negative.   Genitourinary: Negative.    Musculoskeletal: Negative.   Skin: Negative.   Neurological: Negative.   Hematological: Negative.   Psychiatric/Behavioral: Negative.     Physical Exam:  weight is 250 lb (113.4 kg). His oral temperature is 98.6 F (37 C). His blood pressure is 138/77 and his pulse is 76. His respiration is 18 and oxygen saturation is 100%.   Wt Readings from Last 3 Encounters:  08/07/17 250  lb (113.4 kg)  07/22/17 248 lb 12.8 oz (112.9 kg)  06/08/17 256 lb (116.1 kg)    Physical Exam  Constitutional: He is oriented to person, place, and time.  HENT:  Head: Normocephalic and atraumatic.  Mouth/Throat: Oropharynx is clear and moist.  Eyes: Pupils are equal, round, and reactive to light. EOM are normal.  Neck: Normal range of motion.  Cardiovascular: Normal rate, regular rhythm and normal heart sounds.  Pulmonary/Chest: Effort normal and breath sounds normal.  Abdominal: Soft. Bowel sounds are normal.  Musculoskeletal: Normal range of motion. He exhibits no edema, tenderness or deformity.  Lymphadenopathy:    He  has no cervical adenopathy.  Neurological: He is alert and oriented to person, place, and time.  Skin: Skin is warm and dry. No rash noted. No erythema.  Psychiatric: He has a normal mood and affect. His behavior is normal. Judgment and thought content normal.  Vitals reviewed.    Lab Results  Component Value Date   WBC 6.9 08/07/2017   HGB 15.2 08/07/2017   HCT 44.0 08/07/2017   MCV 89.2 08/07/2017   PLT 271 08/07/2017     Chemistry      Component Value Date/Time   NA 142 06/08/2017 1524   NA 146 (H) 05/12/2017 0934   K 3.7 06/08/2017 1524   CL 107 06/08/2017 1524   CO2 27 06/08/2017 1524   BUN 12 06/08/2017 1524   BUN 16 05/12/2017 0934   CREATININE 1.10 06/08/2017 1524      Component Value Date/Time   CALCIUM 9.6 06/08/2017 1524   ALKPHOS 114 (H) 06/08/2017 1524   AST 41 (H) 06/08/2017 1524   ALT 78 (H) 06/08/2017 1524   BILITOT 1.6 06/08/2017 1524         Impression and Plan: [name] is a 52 year old white male.  He had a CVA in January 2019.  The CVA was in the right posterior insula and right lateral parietal lobe.  He has elevated IgG and IgA anticardiolipin antibodies.  He has an elevated homocystine level.  He will clearly need lifelong anticoagulation.  He is on aspirin right now.  I will add folic acid.  If he has another event, he will definitely need formal anticoagulation with 1 of the new oral anticoagulants.  I am happy that he is doing better.  He looks better.  He feels better.  We will plan to get him back now in about 3 months.  I do not see that we have to do any scans on him.  He is fun to talk to.   Jesus Macho, MD 6/28/20194:39 PM

## 2017-08-08 LAB — CARDIOLIPIN ANTIBODIES, IGG, IGM, IGA
Anticardiolipin IgA: <9 APL U/mL (ref 0–11)
Anticardiolipin IgG: 63 GPL U/mL — ABNORMAL HIGH (ref 0–14)
Anticardiolipin IgM: <9 MPL U/mL (ref 0–12)

## 2017-08-09 LAB — LUPUS ANTICOAGULANT PANEL
DRVVT: 73.3 s — ABNORMAL HIGH (ref 0.0–47.0)
PTT Lupus Anticoagulant: 50.4 s (ref 0.0–51.9)

## 2017-08-09 LAB — DRVVT MIX: dRVVT Mix: 44.3 s (ref 0.0–47.0)

## 2017-08-31 ENCOUNTER — Ambulatory Visit (INDEPENDENT_AMBULATORY_CARE_PROVIDER_SITE_OTHER): Payer: BLUE CROSS/BLUE SHIELD | Admitting: *Deleted

## 2017-08-31 DIAGNOSIS — I639 Cerebral infarction, unspecified: Secondary | ICD-10-CM

## 2017-08-31 NOTE — Progress Notes (Signed)
Carelink Summary Report / Loop Recorder 

## 2017-09-02 LAB — CUP PACEART REMOTE DEVICE CHECK
Date Time Interrogation Session: 20190618174047
Implantable Pulse Generator Implant Date: 20190107

## 2017-10-02 ENCOUNTER — Ambulatory Visit (INDEPENDENT_AMBULATORY_CARE_PROVIDER_SITE_OTHER): Payer: BLUE CROSS/BLUE SHIELD | Admitting: *Deleted

## 2017-10-02 DIAGNOSIS — I639 Cerebral infarction, unspecified: Secondary | ICD-10-CM

## 2017-10-05 NOTE — Progress Notes (Signed)
Carelink Summary Report / Loop Recorder 

## 2017-10-16 LAB — CUP PACEART REMOTE DEVICE CHECK
Date Time Interrogation Session: 20190721173648
Implantable Pulse Generator Implant Date: 20190107

## 2017-11-03 ENCOUNTER — Ambulatory Visit: Payer: BLUE CROSS/BLUE SHIELD | Admitting: Diagnostic Neuroimaging

## 2017-11-03 LAB — CUP PACEART REMOTE DEVICE CHECK
Date Time Interrogation Session: 20190823180604
Implantable Pulse Generator Implant Date: 20190107

## 2017-11-05 ENCOUNTER — Ambulatory Visit (INDEPENDENT_AMBULATORY_CARE_PROVIDER_SITE_OTHER): Payer: BLUE CROSS/BLUE SHIELD | Admitting: *Deleted

## 2017-11-05 DIAGNOSIS — I639 Cerebral infarction, unspecified: Secondary | ICD-10-CM

## 2017-11-05 NOTE — Progress Notes (Signed)
Carelink Summary Report / Loop Recorder 

## 2017-11-09 LAB — CUP PACEART REMOTE DEVICE CHECK
Date Time Interrogation Session: 20190925183808
Implantable Pulse Generator Implant Date: 20190107

## 2017-11-11 ENCOUNTER — Ambulatory Visit: Payer: BLUE CROSS/BLUE SHIELD | Admitting: Physical Medicine & Rehabilitation

## 2017-11-12 ENCOUNTER — Other Ambulatory Visit: Payer: Self-pay

## 2017-11-12 ENCOUNTER — Encounter
Payer: BLUE CROSS/BLUE SHIELD | Attending: Physical Medicine & Rehabilitation | Admitting: Physical Medicine & Rehabilitation

## 2017-11-12 ENCOUNTER — Encounter: Payer: Self-pay | Admitting: Physical Medicine & Rehabilitation

## 2017-11-12 VITALS — BP 123/85 | HR 72 | Ht 71.0 in | Wt 236.0 lb

## 2017-11-12 DIAGNOSIS — I69390 Apraxia following cerebral infarction: Secondary | ICD-10-CM | POA: Diagnosis not present

## 2017-11-12 DIAGNOSIS — Z8249 Family history of ischemic heart disease and other diseases of the circulatory system: Secondary | ICD-10-CM | POA: Diagnosis not present

## 2017-11-12 DIAGNOSIS — R269 Unspecified abnormalities of gait and mobility: Secondary | ICD-10-CM | POA: Diagnosis not present

## 2017-11-12 DIAGNOSIS — I10 Essential (primary) hypertension: Secondary | ICD-10-CM | POA: Insufficient documentation

## 2017-11-12 DIAGNOSIS — I6990 Unspecified sequelae of unspecified cerebrovascular disease: Secondary | ICD-10-CM | POA: Diagnosis not present

## 2017-11-12 DIAGNOSIS — Z6832 Body mass index (BMI) 32.0-32.9, adult: Secondary | ICD-10-CM | POA: Diagnosis not present

## 2017-11-12 DIAGNOSIS — E669 Obesity, unspecified: Secondary | ICD-10-CM | POA: Insufficient documentation

## 2017-11-12 DIAGNOSIS — Z823 Family history of stroke: Secondary | ICD-10-CM | POA: Diagnosis not present

## 2017-11-12 NOTE — Progress Notes (Addendum)
Subjective:    Patient ID: [name], male    DOB: 1966/01/12, 52 y.o.   MRN: [id number]  HPI  Male with history of HTN presents for follow up for right insular, parietal and cerebellar infarcts.     Last clinic visit 05/14/17. Since that time, pt was seen for left sided swelling and given compression hose, notes reviewed. He continues to follow up with Neurology. He lost his job and found a new job and is having some difficulty with typing. BP is controlled. He is losing weight.  Denies falls.  Pain Inventory Average Pain 2 Pain Right Now 2 My pain is dull  In the last 24 hours, has pain interfered with the following? General activity 2 Relation with others 2 Enjoyment of life 2 What TIME of day is your pain at its worst? na Sleep (in general) Good  Pain is worse with: walking Pain improves with: na Relief from Meds: 0  Mobility walk without assistance ability to climb steps?  yes do you drive?  no  Function employed # of hrs/week 45hrs  Neuro/Psych trouble walking  Prior Studies Any changes since last visit?  no  Physicians involved in your care Any changes since last visit?  no   Family History  Problem Relation Age of Onset  . Hypertension Mother   . Hypertension Father   . Stroke Father   . Hypertension Sister   . Leukemia Paternal Uncle    Social History   Socioeconomic History  . Marital status: Married    Spouse name: Vickie  . Number of children: 0  . Years of education: College  . Highest education level: Not on file  Occupational History  . Occupation: unemployed-outside Airline pilot    Comment: [employer]  Social Needs  . Financial resource strain: Not on file  . Food insecurity:    Worry: Not on file    Inability: Not on file  . Transportation needs:    Medical: Not on file    Non-medical: Not on file  Tobacco Use  . Smoking status: Never Smoker  . Smokeless tobacco: Never Used  Substance and Sexual Activity  . Alcohol  use: Yes    Alcohol/week: 0.0 standard drinks    Comment: rarely, 1-2 times/year  . Drug use: No  . Sexual activity: Yes    Partners: Female  Lifestyle  . Physical activity:    Days per week: Not on file    Minutes per session: Not on file  . Stress: Not on file  Relationships  . Social connections:    Talks on phone: Not on file    Gets together: Not on file    Attends religious service: Not on file    Active member of club or organization: Not on file    Attends meetings of clubs or organizations: Not on file    Relationship status: Not on file  Other Topics Concern  . Not on file  Social History Narrative   Lives with his wife, Vickie at home and 2 dogs.  Education [employer].  No children.  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    After 22 years, in 05/2017, he was let go by his employer upon his return to work after CVA.   Past Surgical History:  Procedure Laterality Date  . LOOP RECORDER INSERTION N/A 02/16/2017   Procedure: LOOP RECORDER INSERTION;  Surgeon: Regan Lemming, MD;  Location: [facility] INVASIVE CV LAB;  Service: Cardiovascular;  Laterality: N/A;  .  NO PAST SURGERIES    . TEE WITHOUT CARDIOVERSION N/A 02/16/2017   Procedure: TRANSESOPHAGEAL ECHOCARDIOGRAM (TEE);  Surgeon: Jake Bathe, MD;  Location: [facility] ENDOSCOPY;  Service: Cardiovascular;  Laterality: N/A;   Past Medical History:  Diagnosis Date  . 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 Environmental allergies   . Fall from roof    with kidney trauma  . High cholesterol   . Hypertension 2018  . Stroke (HCC) 02/12/2017   BP 123/85   Pulse 72   Ht 5\' 11"  (1.803 m)   Wt 236 lb (107 kg)   SpO2 96%   BMI 32.92 kg/m   Opioid Risk Score:   Fall Risk Score:  `1  Depression screen PHQ 2/9  Depression screen [facility] 2/9 11/12/2017 07/22/2017 05/12/2017 04/03/2017 03/13/2017 02/27/2017 11/26/2016  Decreased Interest 0 0 0 0 0 0 0  Down, Depressed, Hopeless 0 0 0 0 0 0 0  PHQ - 2 Score 0 0 0 0 0 0 0    Review of  Systems  Constitutional: Positive for fatigue.  HENT: Negative.   Eyes: Negative.   Respiratory: Negative.   Cardiovascular: Negative.   Gastrointestinal: Negative.   Endocrine: Negative.   Genitourinary: Negative.   Musculoskeletal: Negative.   Skin: Negative.   Allergic/Immunologic: Negative.   Neurological: Positive for weakness and numbness.  Hematological: Negative.   Psychiatric/Behavioral: Negative.   All other systems reviewed and are negative.     Objective:   Physical Exam Constitutional: He appears well-developed and Obese.  HENT: Normocephalic and atraumatic.  Eyes: EOMI. No discharge. Cardiovascular: RRR. No JVD. Respiratory: Breath sounds normal. Unlabored. GI: Bowel sounds are normal. He exhibits no distension.  Neurological: He is alert and oriented.  Mild left facial weakness with mild dysarthria.  Motor:  LUE/LLE: 5/5 proximal to distal  Skin: Skin is warm and dry.  Psychiatric: He has a normal mood and affect. His behavior is normal. Thought content normal.     Assessment & Plan:  Male with history of HTN presents for hospital follow up after receiving CIR for right insular, parietal and cerebellar infarcts.    1. 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 Obesity  Cont exercise and diet modification  States he continues to lose weight  3. Gait abnormality  Temporary handicap placard provided

## 2017-12-07 ENCOUNTER — Other Ambulatory Visit: Payer: Self-pay

## 2017-12-07 ENCOUNTER — Inpatient Hospital Stay: Payer: BLUE CROSS/BLUE SHIELD

## 2017-12-07 ENCOUNTER — Encounter: Payer: Self-pay | Admitting: Hematology & Oncology

## 2017-12-07 ENCOUNTER — Inpatient Hospital Stay: Payer: BLUE CROSS/BLUE SHIELD | Attending: Hematology & Oncology | Admitting: Hematology & Oncology

## 2017-12-07 ENCOUNTER — Ambulatory Visit (INDEPENDENT_AMBULATORY_CARE_PROVIDER_SITE_OTHER): Payer: BLUE CROSS/BLUE SHIELD | Admitting: *Deleted

## 2017-12-07 VITALS — BP 132/75 | HR 76 | Temp 99.2°F | Resp 18 | Wt 233.0 lb

## 2017-12-07 DIAGNOSIS — I639 Cerebral infarction, unspecified: Secondary | ICD-10-CM

## 2017-12-07 DIAGNOSIS — D6861 Antiphospholipid syndrome: Secondary | ICD-10-CM | POA: Diagnosis not present

## 2017-12-07 DIAGNOSIS — Z8673 Personal history of transient ischemic attack (TIA), and cerebral infarction without residual deficits: Secondary | ICD-10-CM

## 2017-12-07 DIAGNOSIS — Z7982 Long term (current) use of aspirin: Secondary | ICD-10-CM | POA: Diagnosis not present

## 2017-12-07 HISTORY — DX: Antiphospholipid syndrome: D68.61

## 2017-12-07 LAB — CMP (CANCER CENTER ONLY)
ALT: 36 U/L (ref 10–47)
AST: 28 U/L (ref 11–38)
Albumin: 4 g/dL (ref 3.5–5.0)
Alkaline Phosphatase: 104 U/L — ABNORMAL HIGH (ref 26–84)
Anion gap: 3 — ABNORMAL LOW (ref 5–15)
BUN: 16 mg/dL (ref 7–22)
CO2: 27 mmol/L (ref 18–33)
Calcium: 9.1 mg/dL (ref 8.0–10.3)
Chloride: 110 mmol/L — ABNORMAL HIGH (ref 98–108)
Creatinine: 1.1 mg/dL (ref 0.60–1.20)
Glucose, Bld: 103 mg/dL (ref 73–118)
Potassium: 3.8 mmol/L (ref 3.3–4.7)
Sodium: 140 mmol/L (ref 128–145)
Total Bilirubin: 1.5 mg/dL (ref 0.2–1.6)
Total Protein: 7.3 g/dL (ref 6.4–8.1)

## 2017-12-07 LAB — CBC WITH DIFFERENTIAL (CANCER CENTER ONLY)
Abs Immature Granulocytes: 0.01 10*3/uL (ref 0.00–0.07)
Basophils Absolute: 0 10*3/uL (ref 0.0–0.1)
Basophils Relative: 1 %
Eosinophils Absolute: 0.1 10*3/uL (ref 0.0–0.5)
Eosinophils Relative: 1 %
HCT: 46 % (ref 39.0–52.0)
Hemoglobin: 15.4 g/dL (ref 13.0–17.0)
Immature Granulocytes: 0 %
Lymphocytes Relative: 28 %
Lymphs Abs: 1.8 10*3/uL (ref 0.7–4.0)
MCH: 30.5 pg (ref 26.0–34.0)
MCHC: 33.5 g/dL (ref 30.0–36.0)
MCV: 91.1 fL (ref 80.0–100.0)
Monocytes Absolute: 0.6 10*3/uL (ref 0.1–1.0)
Monocytes Relative: 10 %
Neutro Abs: 4 10*3/uL (ref 1.7–7.7)
Neutrophils Relative %: 60 %
Platelet Count: 270 10*3/uL (ref 150–400)
RBC: 5.05 MIL/uL (ref 4.22–5.81)
RDW: 11.8 % (ref 11.5–15.5)
WBC Count: 6.6 10*3/uL (ref 4.0–10.5)
nRBC: 0 % (ref 0.0–0.2)

## 2017-12-07 NOTE — Progress Notes (Signed)
Hematology and Oncology Follow Up Visit  [name] [id number] 05/12/1965 52 y.o. 12/07/2017   Principle Diagnosis:   Anti-cardiolipin antibody syndrome-CVA  Current Therapy:    Aspirin 325 mg p.o. Daily  Folic acid 2 mg p.o. daily     Interim History:  [name] is back for for follow-up.  He is doing pretty well.  We saw him back in June.  He has had a nice summer.  He still has occasional issues with his right hand and right leg.  However, physical therapy, which he was taking, has seemed to be a little bit beneficial form.  He is doing well with the aspirin.  He also is on folic acid.  He is working without difficulty.  He really enjoys going to watch the [name] hockey team.  It sounds like he may have season tickets.  He has had no bleeding.  He has had no cough or shortness of breath.  He has had no change in bowel or bladder habits.  Overall, his performance status is ECOG 1.  Medications:  Current Outpatient Medications:  .  acetaminophen (TYLENOL) 325 MG tablet, Take 650 mg by mouth every 6 (six) hours as needed for mild pain., Disp: , Rfl:  .  amLODipine (NORVASC) 10 MG tablet, Take 1 tablet (10 mg total) by mouth daily., Disp: 90 tablet, Rfl: 3 .  aspirin EC 325 MG EC tablet, Take 1 tablet (325 mg total) by mouth daily., Disp: 100 tablet, Rfl: 0 .  atorvastatin (LIPITOR) 40 MG tablet, Take 1 tablet (40 mg total) by mouth daily at 6 PM., Disp: 90 tablet, Rfl: 3 .  cetirizine (ZYRTEC) 10 MG tablet, Take 10 mg by mouth daily as needed for allergies., Disp: , Rfl:  .  famotidine (PEPCID) 20 MG tablet, Take 1 tablet (20 mg total) by mouth daily., Disp: 90 tablet, Rfl: 3 .  folic acid (FOLVITE) 1 MG tablet, Take 1 tablet (1 mg total) by mouth daily., Disp: 90 tablet, Rfl: 6 .  lisinopril (PRINIVIL,ZESTRIL) 20 MG tablet, Take 1 tablet (20 mg total) by mouth daily., Disp: 90 tablet, Rfl: 3  Allergies:  Allergies  Allergen Reactions  . Other Other  (See Comments)    Strawberries and blueberries  Strawberries and blueberries  . Penicillins Other (See Comments)    Headache Has patient had a PCN reaction causing immediate rash, facial/tongue/throat swelling, SOB or lightheadedness with hypotension: No Has patient had a PCN reaction causing severe rash involving mucus membranes or skin necrosis: No Has patient had a PCN reaction that required hospitalization: No Has patient had a PCN reaction occurring within the last 10 years:No If all of the above answers are "NO", then may proceed with Cephalosporin use.  Headache Has patient had a PCN reaction causing immediate rash, facial/tongue/throat swelling, SOB or lightheadedness with hypotension: No Has patient had a PCN reaction causing severe rash involving mucus membranes or skin necrosis: No Has patient had a PCN reaction that required hospitalization: No Has patient had a PCN reaction occurring within the last 10 years:No If all of the above answers are "NO", then may proceed with Cephalosporin use.    Past Medical History, Surgical history, Social history, and Family History were reviewed and updated.  Review of Systems: Review of Systems  Constitutional: Negative.   HENT:  Negative.   Eyes: Negative.   Respiratory: Negative.   Cardiovascular: Negative.   Gastrointestinal: Negative.   Endocrine: Negative.   Genitourinary: Negative.  Musculoskeletal: Negative.   Skin: Negative.   Neurological: Negative.   Hematological: Negative.   Psychiatric/Behavioral: Negative.     Physical Exam:  weight is 233 lb (105.7 kg). His oral temperature is 99.2 F (37.3 C). His blood pressure is 132/75 and his pulse is 76. His respiration is 18 and oxygen saturation is 100%.   Wt Readings from Last 3 Encounters:  12/07/17 233 lb (105.7 kg)  11/12/17 236 lb (107 kg)  08/07/17 250 lb (113.4 kg)    Physical Exam  Constitutional: He is oriented to person, place, and time.  HENT:    Head: Normocephalic and atraumatic.  Mouth/Throat: Oropharynx is clear and moist.  Eyes: Pupils are equal, round, and reactive to light. EOM are normal.  Neck: Normal range of motion.  Cardiovascular: Normal rate, regular rhythm and normal heart sounds.  Pulmonary/Chest: Effort normal and breath sounds normal.  Abdominal: Soft. Bowel sounds are normal.  Musculoskeletal: Normal range of motion. He exhibits no edema, tenderness or deformity.  Lymphadenopathy:    He has no cervical adenopathy.  Neurological: He is alert and oriented to person, place, and time.  Skin: Skin is warm and dry. No rash noted. No erythema.  Psychiatric: He has a normal mood and affect. His behavior is normal. Judgment and thought content normal.  Vitals reviewed.    Lab Results  Component Value Date   WBC 6.6 12/07/2017   HGB 15.4 12/07/2017   HCT 46.0 12/07/2017   MCV 91.1 12/07/2017   PLT 270 12/07/2017     Chemistry      Component Value Date/Time   NA 140 12/07/2017 0808   NA 146 (H) 05/12/2017 0934   K 3.8 12/07/2017 0808   CL 110 (H) 12/07/2017 0808   CO2 27 12/07/2017 0808   BUN 16 12/07/2017 0808   BUN 16 05/12/2017 0934   CREATININE 1.10 12/07/2017 0808      Component Value Date/Time   CALCIUM 9.1 12/07/2017 0808   ALKPHOS 104 (H) 12/07/2017 0808   AST 28 12/07/2017 0808   ALT 36 12/07/2017 0808   BILITOT 1.5 12/07/2017 0808         Impression and Plan: [name] is a 52 year old white male.  He had a CVA in January 2019.  The CVA was in the right posterior insula and right lateral parietal lobe.  He has elevated IgG and IgA anticardiolipin antibodies.  He has an elevated homocystine level.  He will clearly need lifelong anticoagulation.  He is on aspirin and folic acid right now.   If he has another event, he will definitely need formal anticoagulation with 1 of the new oral anticoagulants.  I am happy that he is doing better.  He looks better.  He feels better.  We will  plan to get him back now in about 4 months.  I do not see that we have to do any scans on him.   Jesus Macho, MD 10/28/20199:12 AM

## 2017-12-08 LAB — CARDIOLIPIN ANTIBODIES, IGG, IGM, IGA
Anticardiolipin IgA: 11 APL U/mL (ref 0–11)
Anticardiolipin IgG: 85 GPL U/mL — ABNORMAL HIGH (ref 0–14)
Anticardiolipin IgM: <9 MPL U/mL (ref 0–12)

## 2017-12-08 NOTE — Progress Notes (Signed)
Carelink Summary Report / Loop Recorder 

## 2017-12-09 LAB — PTT-LA MIX: PTT-LA Mix: 49.3 s — ABNORMAL HIGH (ref 0.0–48.9)

## 2017-12-09 LAB — DRVVT MIX: dRVVT Mix: 52.8 s — ABNORMAL HIGH (ref 0.0–47.0)

## 2017-12-09 LAB — DRVVT CONFIRM: dRVVT Confirm: 2.6 ratio — ABNORMAL HIGH (ref 0.8–1.2)

## 2017-12-09 LAB — LUPUS ANTICOAGULANT PANEL
DRVVT: 100.5 s — ABNORMAL HIGH (ref 0.0–47.0)
PTT Lupus Anticoagulant: 52.5 s — ABNORMAL HIGH (ref 0.0–51.9)

## 2017-12-09 LAB — HEXAGONAL PHASE PHOSPHOLIPID: Hexagonal Phase Phospholipid: 21 s — ABNORMAL HIGH (ref 0–11)

## 2017-12-30 LAB — CUP PACEART REMOTE DEVICE CHECK
Date Time Interrogation Session: 20191028200957
Implantable Pulse Generator Implant Date: 20190107

## 2018-01-11 ENCOUNTER — Ambulatory Visit (INDEPENDENT_AMBULATORY_CARE_PROVIDER_SITE_OTHER): Payer: BLUE CROSS/BLUE SHIELD

## 2018-01-11 DIAGNOSIS — I639 Cerebral infarction, unspecified: Secondary | ICD-10-CM | POA: Diagnosis not present

## 2018-01-11 NOTE — Progress Notes (Signed)
Carelink Summary Report / Loop Recorder 

## 2018-02-05 LAB — CUP PACEART REMOTE DEVICE CHECK
Date Time Interrogation Session: 20191130204004
Implantable Pulse Generator Implant Date: 20190107

## 2018-02-11 ENCOUNTER — Ambulatory Visit (INDEPENDENT_AMBULATORY_CARE_PROVIDER_SITE_OTHER): Payer: BLUE CROSS/BLUE SHIELD

## 2018-02-11 DIAGNOSIS — I639 Cerebral infarction, unspecified: Secondary | ICD-10-CM

## 2018-02-12 LAB — CUP PACEART REMOTE DEVICE CHECK
Date Time Interrogation Session: 20200102211031
Implantable Pulse Generator Implant Date: 20190107

## 2018-02-12 NOTE — Progress Notes (Signed)
Carelink Summary Report / Loop Recorder 

## 2018-03-16 ENCOUNTER — Ambulatory Visit (INDEPENDENT_AMBULATORY_CARE_PROVIDER_SITE_OTHER): Payer: BLUE CROSS/BLUE SHIELD

## 2018-03-16 DIAGNOSIS — I639 Cerebral infarction, unspecified: Secondary | ICD-10-CM | POA: Diagnosis not present

## 2018-03-18 LAB — CUP PACEART REMOTE DEVICE CHECK
Date Time Interrogation Session: 20200204214006
Implantable Pulse Generator Implant Date: 20190107

## 2018-03-24 NOTE — Progress Notes (Signed)
Carelink Summary Report / Loop Recorder 

## 2018-04-09 ENCOUNTER — Encounter: Payer: Self-pay | Admitting: Hematology & Oncology

## 2018-04-09 ENCOUNTER — Other Ambulatory Visit: Payer: Self-pay

## 2018-04-09 ENCOUNTER — Inpatient Hospital Stay: Payer: BLUE CROSS/BLUE SHIELD

## 2018-04-09 ENCOUNTER — Inpatient Hospital Stay: Payer: BLUE CROSS/BLUE SHIELD | Attending: Hematology & Oncology | Admitting: Hematology & Oncology

## 2018-04-09 VITALS — BP 128/68 | HR 71 | Temp 98.2°F | Resp 18 | Wt 222.5 lb

## 2018-04-09 DIAGNOSIS — D6861 Antiphospholipid syndrome: Secondary | ICD-10-CM | POA: Diagnosis not present

## 2018-04-09 DIAGNOSIS — Z7982 Long term (current) use of aspirin: Secondary | ICD-10-CM | POA: Insufficient documentation

## 2018-04-09 DIAGNOSIS — Z7901 Long term (current) use of anticoagulants: Secondary | ICD-10-CM | POA: Diagnosis not present

## 2018-04-09 LAB — CBC WITH DIFFERENTIAL (CANCER CENTER ONLY)
Abs Immature Granulocytes: 0.02 10*3/uL (ref 0.00–0.07)
Basophils Absolute: 0 10*3/uL (ref 0.0–0.1)
Basophils Relative: 0 %
Eosinophils Absolute: 0.2 10*3/uL (ref 0.0–0.5)
Eosinophils Relative: 2 %
HCT: 42.7 % (ref 39.0–52.0)
Hemoglobin: 14.6 g/dL (ref 13.0–17.0)
Immature Granulocytes: 0 %
Lymphocytes Relative: 30 %
Lymphs Abs: 2 10*3/uL (ref 0.7–4.0)
MCH: 31.6 pg (ref 26.0–34.0)
MCHC: 34.2 g/dL (ref 30.0–36.0)
MCV: 92.4 fL (ref 80.0–100.0)
Monocytes Absolute: 0.7 10*3/uL (ref 0.1–1.0)
Monocytes Relative: 11 %
Neutro Abs: 3.9 10*3/uL (ref 1.7–7.7)
Neutrophils Relative %: 57 %
Platelet Count: 257 10*3/uL (ref 150–400)
RBC: 4.62 MIL/uL (ref 4.22–5.81)
RDW: 12.2 % (ref 11.5–15.5)
WBC Count: 6.9 10*3/uL (ref 4.0–10.5)
nRBC: 0 % (ref 0.0–0.2)

## 2018-04-09 LAB — CMP (CANCER CENTER ONLY)
ALT: 22 U/L (ref 0–44)
AST: 21 U/L (ref 15–41)
Albumin: 4.4 g/dL (ref 3.5–5.0)
Alkaline Phosphatase: 94 U/L (ref 38–126)
Anion gap: 7 (ref 5–15)
BUN: 19 mg/dL (ref 6–20)
CO2: 29 mmol/L (ref 22–32)
Calcium: 9.3 mg/dL (ref 8.9–10.3)
Chloride: 106 mmol/L (ref 98–111)
Creatinine: 1.22 mg/dL (ref 0.61–1.24)
GFR, Est AFR Am: >60 mL/min (ref >60)
GFR, Estimated: >60 mL/min (ref >60)
Glucose, Bld: 100 mg/dL — ABNORMAL HIGH (ref 70–99)
Potassium: 4.6 mmol/L (ref 3.5–5.1)
Sodium: 142 mmol/L (ref 135–145)
Total Bilirubin: 1.5 mg/dL — ABNORMAL HIGH (ref 0.3–1.2)
Total Protein: 7.3 g/dL (ref 6.5–8.1)

## 2018-04-09 NOTE — Progress Notes (Signed)
Hematology and Oncology Follow Up Visit  [name] [id number] 1965-04-15 53 y.o. 04/09/2018   Principle Diagnosis:   Anti-cardiolipin antibody syndrome-CVA  Current Therapy:    Aspirin 325 mg p.o. Daily  Folic acid 2 mg p.o. daily     Interim History:  [name] is back for for follow-up.  He is doing pretty well.  He has wife just moved closer to [place].  They did this because of work issues.  He now is about 10 minutes from work.  He has had no issues with respect to the anti-Cardiolite been antibody syndrome.  He has had no cerebrovascular issues.  He is doing well with the aspirin and folic acid.  When we saw him back in October, he still had a markedly elevated anticardiolipin IgG antibody of 85.  He had a positive lupus anticoagulant.  He has had no cough.  There is no headache.  There is no chest wall pain.  He has had no change in bowel or bladder habits.  He has not noted any leg swelling.  He has season tickets to the [employer] and also the [employer].  As such, he does travel quite a bit.  There is been no issues with bleeding.  Overall, his performance status is ECOG 1.  Medications:  Current Outpatient Medications:  .  acetaminophen (TYLENOL) 325 MG tablet, Take 650 mg by mouth every 6 (six) hours as needed for mild pain., Disp: , Rfl:  .  amLODipine (NORVASC) 10 MG tablet, Take 1 tablet (10 mg total) by mouth daily., Disp: 90 tablet, Rfl: 3 .  aspirin EC 325 MG EC tablet, Take 1 tablet (325 mg total) by mouth daily., Disp: 100 tablet, Rfl: 0 .  atorvastatin (LIPITOR) 40 MG tablet, Take 1 tablet (40 mg total) by mouth daily at 6 PM., Disp: 90 tablet, Rfl: 3 .  cetirizine (ZYRTEC) 10 MG tablet, Take 10 mg by mouth daily as needed for allergies., Disp: , Rfl:  .  famotidine (PEPCID) 20 MG tablet, Take 1 tablet (20 mg total) by mouth daily., Disp: 90 tablet, Rfl: 3 .  folic acid (FOLVITE) 1 MG tablet, Take 1 tablet (1 mg total) by mouth  daily., Disp: 90 tablet, Rfl: 6 .  lisinopril (PRINIVIL,ZESTRIL) 20 MG tablet, Take 1 tablet (20 mg total) by mouth daily., Disp: 90 tablet, Rfl: 3  Allergies:  Allergies  Allergen Reactions  . Other Other (See Comments)    Strawberries and blueberries  Strawberries and blueberries  . Penicillins Other (See Comments)    Headache Has patient had a PCN reaction causing immediate rash, facial/tongue/throat swelling, SOB or lightheadedness with hypotension: No Has patient had a PCN reaction causing severe rash involving mucus membranes or skin necrosis: No Has patient had a PCN reaction that required hospitalization: No Has patient had a PCN reaction occurring within the last 10 years:No If all of the above answers are "NO", then may proceed with Cephalosporin use.  Headache Has patient had a PCN reaction causing immediate rash, facial/tongue/throat swelling, SOB or lightheadedness with hypotension: No Has patient had a PCN reaction causing severe rash involving mucus membranes or skin necrosis: No Has patient had a PCN reaction that required hospitalization: No Has patient had a PCN reaction occurring within the last 10 years:No If all of the above answers are "NO", then may proceed with Cephalosporin use.    Past Medical History, Surgical history, Social history, and Family History were reviewed and updated.  Review of  Systems: Review of Systems  Constitutional: Negative.   HENT:  Negative.   Eyes: Negative.   Respiratory: Negative.   Cardiovascular: Negative.   Gastrointestinal: Negative.   Endocrine: Negative.   Genitourinary: Negative.    Musculoskeletal: Negative.   Skin: Negative.   Neurological: Negative.   Hematological: Negative.   Psychiatric/Behavioral: Negative.     Physical Exam:  weight is 222 lb 8 oz (100.9 kg). His oral temperature is 98.2 F (36.8 C). His blood pressure is 128/68 and his pulse is 71. His respiration is 18 and oxygen saturation is 100%.    Wt Readings from Last 3 Encounters:  04/09/18 222 lb 8 oz (100.9 kg)  12/07/17 233 lb (105.7 kg)  11/12/17 236 lb (107 kg)    Physical Exam Vitals signs reviewed.  HENT:     Head: Normocephalic and atraumatic.  Eyes:     Pupils: Pupils are equal, round, and reactive to light.  Neck:     Musculoskeletal: Normal range of motion.  Cardiovascular:     Rate and Rhythm: Normal rate and regular rhythm.     Heart sounds: Normal heart sounds.  Pulmonary:     Effort: Pulmonary effort is normal.     Breath sounds: Normal breath sounds.  Abdominal:     General: Bowel sounds are normal.     Palpations: Abdomen is soft.  Musculoskeletal: Normal range of motion.        General: No tenderness or deformity.  Lymphadenopathy:     Cervical: No cervical adenopathy.  Skin:    General: Skin is warm and dry.     Findings: No erythema or rash.  Neurological:     Mental Status: He is alert and oriented to person, place, and time.  Psychiatric:        Behavior: Behavior normal.        Thought Content: Thought content normal.        Judgment: Judgment normal.      Lab Results  Component Value Date   WBC 6.9 04/09/2018   HGB 14.6 04/09/2018   HCT 42.7 04/09/2018   MCV 92.4 04/09/2018   PLT 257 04/09/2018     Chemistry      Component Value Date/Time   NA 142 04/09/2018 1517   NA 146 (H) 05/12/2017 0934   K 4.6 04/09/2018 1517   CL 106 04/09/2018 1517   CO2 29 04/09/2018 1517   BUN 19 04/09/2018 1517   BUN 16 05/12/2017 0934   CREATININE 1.22 04/09/2018 1517      Component Value Date/Time   CALCIUM 9.3 04/09/2018 1517   ALKPHOS 94 04/09/2018 1517   AST 21 04/09/2018 1517   ALT 22 04/09/2018 1517   BILITOT 1.5 (H) 04/09/2018 1517         Impression and Plan: [name] is a 53 year old white male.  He had a CVA in January 2019.  The CVA was in the right posterior insula and right lateral parietal lobe.  He has elevated IgG and IgA anticardiolipin antibodies.  He has an  elevated homocystine level.  He will clearly need lifelong anticoagulation.  He is on aspirin and folic acid right now.   If he has another event, he will definitely need formal anticoagulation with 1 of the new oral anticoagulants.  I am happy that he is doing better.  He looks better.  He feels better.  We will plan to get him back now in about 6 months.  I do not see  that we have to do any scans on him.   [name], MD 2/28/20205:58 PM

## 2018-04-10 LAB — CARDIOLIPIN ANTIBODIES, IGG, IGM, IGA
Anticardiolipin IgA: 15 APL U/mL — ABNORMAL HIGH (ref 0–11)
Anticardiolipin IgG: 55 GPL U/mL — ABNORMAL HIGH (ref 0–14)
Anticardiolipin IgM: <9 MPL U/mL (ref 0–12)

## 2018-04-13 LAB — BETA-2-GLYCOPROTEIN I ABS, IGG/M/A
Beta-2 Glyco I IgG: >150 GPI IgG units — ABNORMAL HIGH (ref 0–20)
Beta-2-Glycoprotein I IgA: 29 GPI IgA units — ABNORMAL HIGH (ref 0–25)
Beta-2-Glycoprotein I IgM: <9 GPI IgM units (ref 0–32)

## 2018-04-19 ENCOUNTER — Ambulatory Visit (INDEPENDENT_AMBULATORY_CARE_PROVIDER_SITE_OTHER): Payer: BLUE CROSS/BLUE SHIELD | Admitting: *Deleted

## 2018-04-19 DIAGNOSIS — I639 Cerebral infarction, unspecified: Secondary | ICD-10-CM | POA: Diagnosis not present

## 2018-04-20 LAB — CUP PACEART REMOTE DEVICE CHECK
Date Time Interrogation Session: 20200308220644
Implantable Pulse Generator Implant Date: 20190107

## 2018-04-26 NOTE — Progress Notes (Signed)
Carelink Summary Report / Loop Recorder 

## 2018-04-29 ENCOUNTER — Encounter: Payer: Self-pay | Admitting: Hematology & Oncology

## 2018-05-21 ENCOUNTER — Ambulatory Visit (INDEPENDENT_AMBULATORY_CARE_PROVIDER_SITE_OTHER): Payer: BLUE CROSS/BLUE SHIELD | Admitting: *Deleted

## 2018-05-21 ENCOUNTER — Other Ambulatory Visit: Payer: Self-pay

## 2018-05-21 DIAGNOSIS — I639 Cerebral infarction, unspecified: Secondary | ICD-10-CM

## 2018-05-22 LAB — CUP PACEART REMOTE DEVICE CHECK
Date Time Interrogation Session: 20200410224008
Implantable Pulse Generator Implant Date: 20190107

## 2018-05-28 NOTE — Progress Notes (Signed)
Carelink Summary Report / Loop Recorder 

## 2018-06-16 ENCOUNTER — Other Ambulatory Visit: Payer: Self-pay | Admitting: Hematology & Oncology

## 2018-06-23 ENCOUNTER — Ambulatory Visit (INDEPENDENT_AMBULATORY_CARE_PROVIDER_SITE_OTHER): Payer: BLUE CROSS/BLUE SHIELD | Admitting: *Deleted

## 2018-06-23 DIAGNOSIS — I639 Cerebral infarction, unspecified: Secondary | ICD-10-CM

## 2018-06-24 LAB — CUP PACEART REMOTE DEVICE CHECK
Date Time Interrogation Session: 20200513223745
Implantable Pulse Generator Implant Date: 20190107

## 2018-06-29 IMAGING — CT CT CHEST W/ CM
1 series · 15 of 34 positions shown, 19 images · IV contrast (APPLIED)
Comparison: Neck CTA 02/12/2017

ADDENDUM:
For clarification, the intention is for recommended thyroid
ultrasound to be obtained now rather than after a certain interval.
CLINICAL DATA: Nodular densities in the right upper lobe on recent
neck CTA.

EXAM:
CT CHEST WITH CONTRAST
TECHNIQUE: Multidetector CT imaging of the chest was performed during
intravenous contrast administration.
CONTRAST:  75mL [id number] IOPAMIDOL (YWR7N4-QFF) INJECTION 61%

[Series 2: chest w/cm · axial · 0.74mm/px · z∈[-334,-80]mm · 15 of 149 slices shown, 19 images]
[im 11/149  mediastinal]
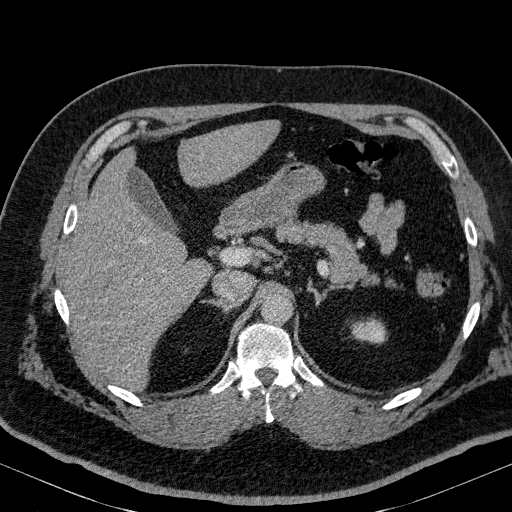
[im 11/149  lung]
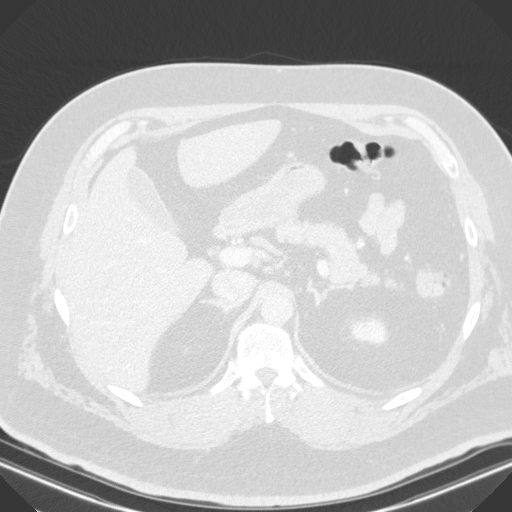
[im 22/149  lung]
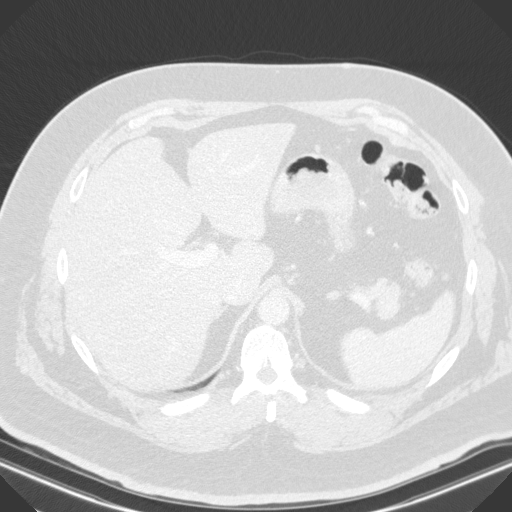
[im 30/149  lung]
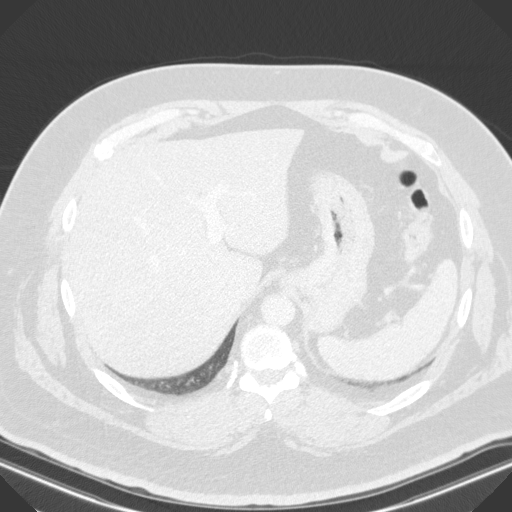
[im 39/149  lung]
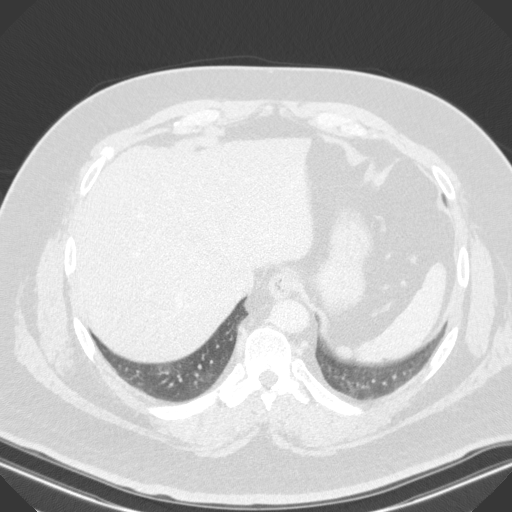
[im 50/149  mediastinal]
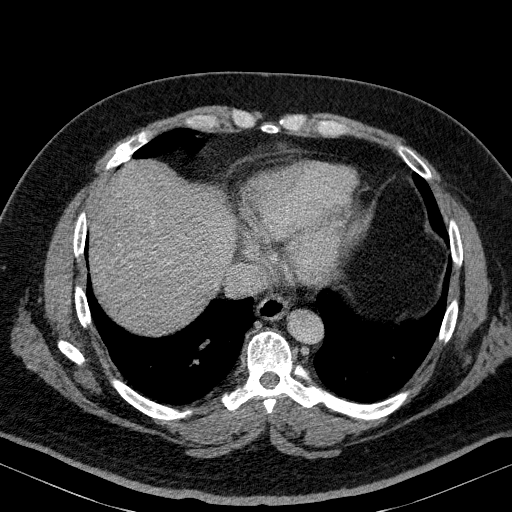
[im 50/149  lung]
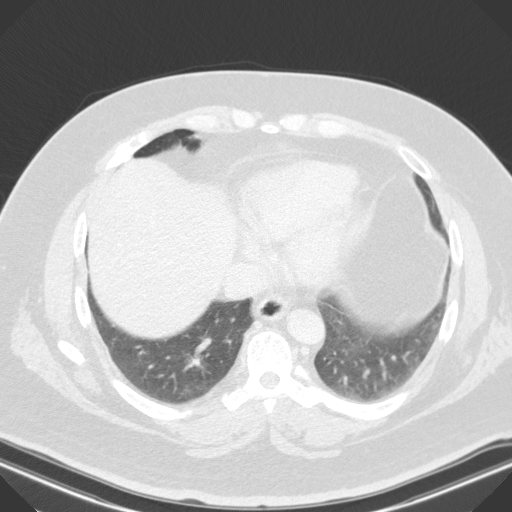
[im 60/149  lung]
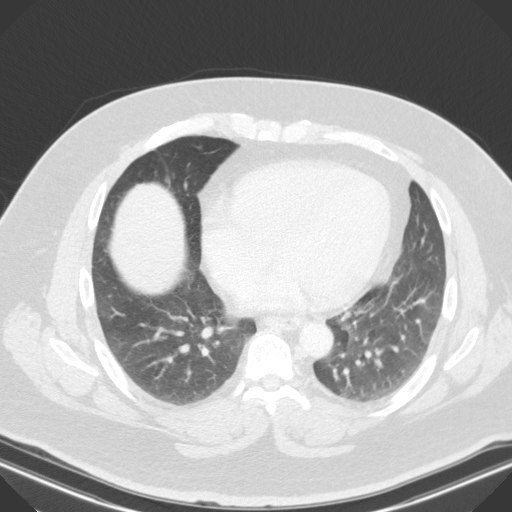
[im 66/149  lung]
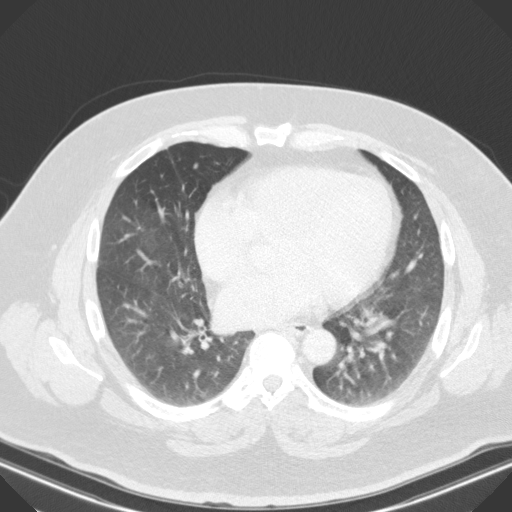
[im 77/149  lung]
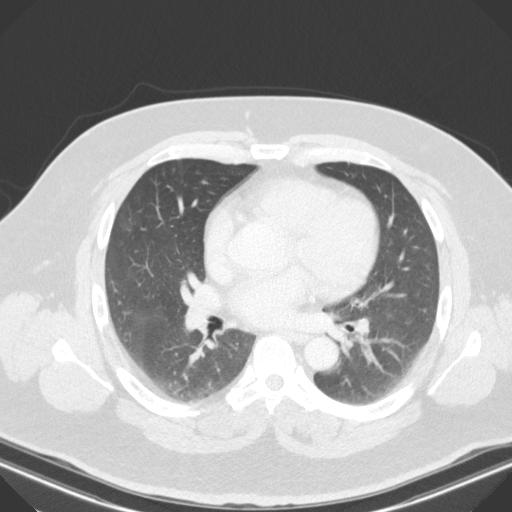
[im 83/149  mediastinal]
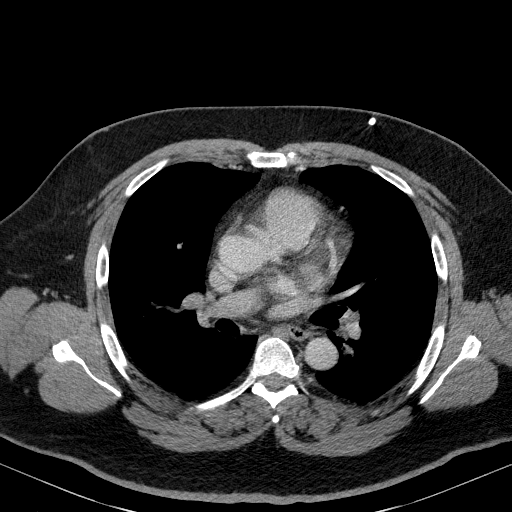
[im 83/149  lung]
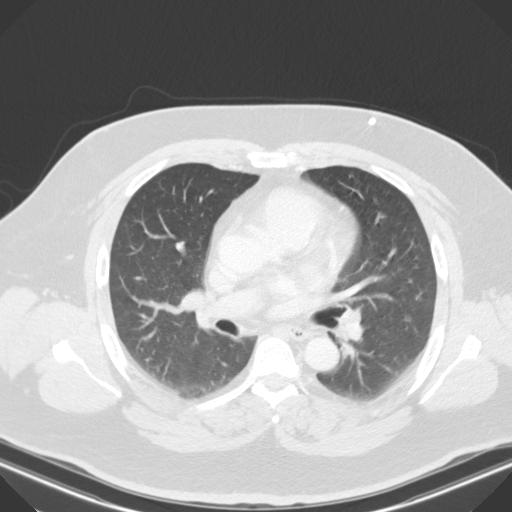
[im 89/149  lung]
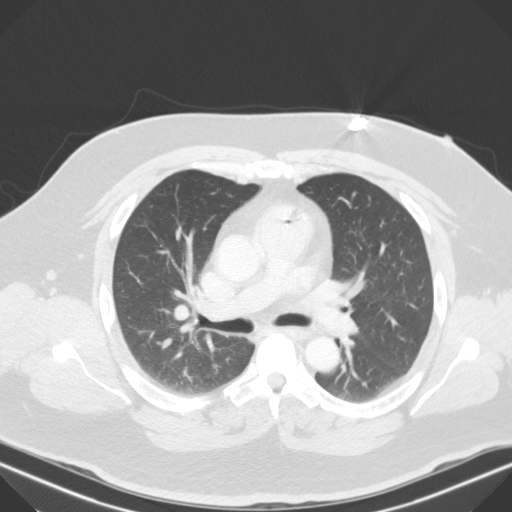
[im 99/149  lung]
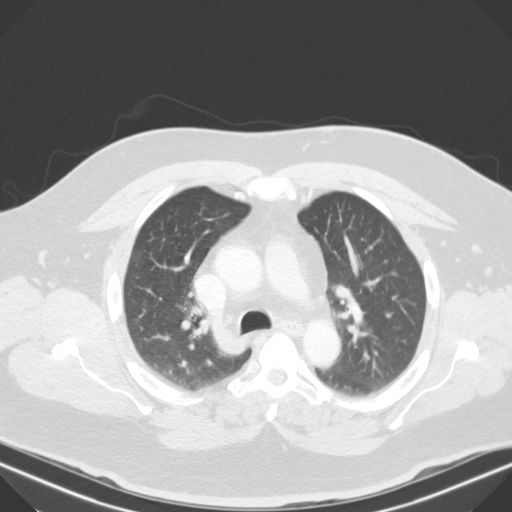
[im 110/149  lung]
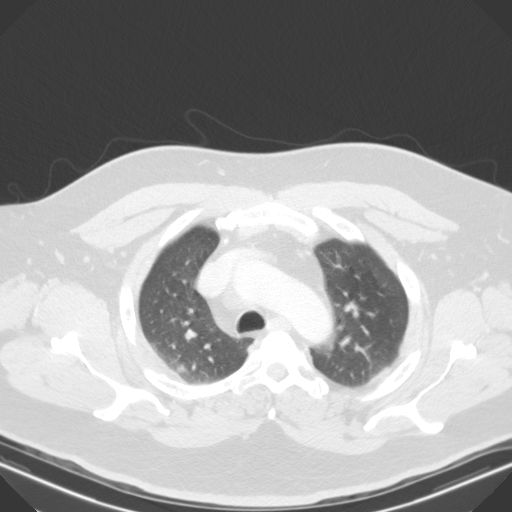
[im 119/149  mediastinal]
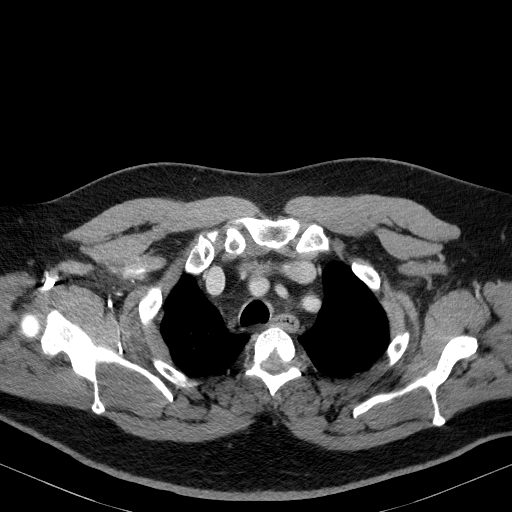
[im 119/149  lung]
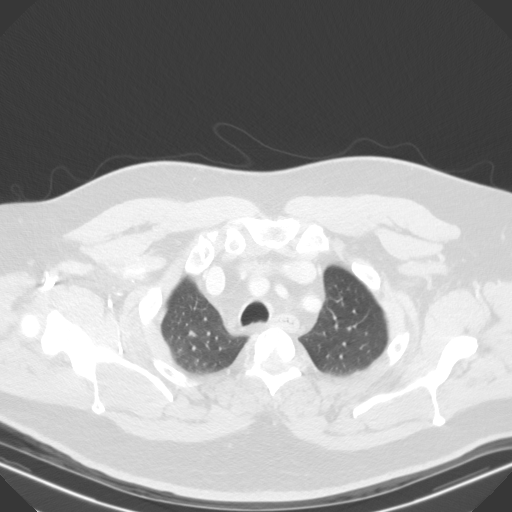
[im 127/149  lung]
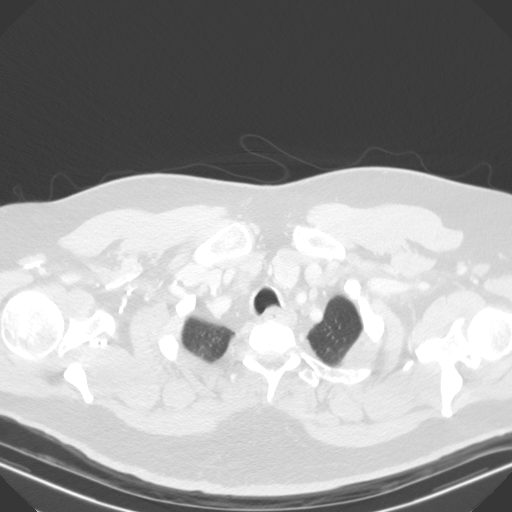
[im 138/149  lung]
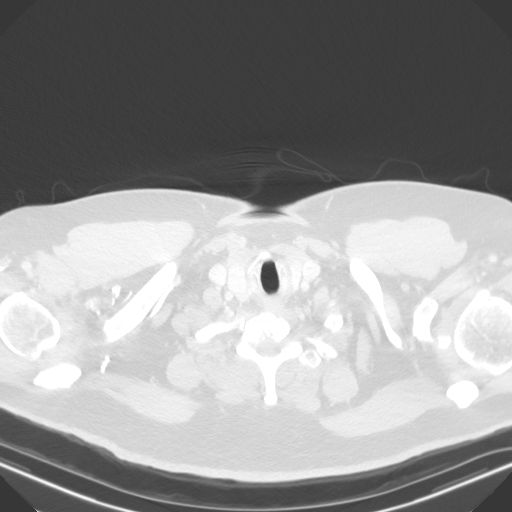

[15 of 34 positions shown; findings below may reference images not displayed]

FINDINGS: Cardiovascular: Normal heart size. No pericardial effusion.
Unremarkable appearance of the great vessels.

Mediastinum/Nodes: 2 cm right thyroid nodule. Negative for
adenopathy.

Lungs/Pleura: Nodular densities in the right upper lobe on preceding
neck CTA are resolved. There is occasional subtle ground-glass
density mainly in the dependent lungs that is attributed to
atelectasis. Generalized airway thickening. There is no edema,
consolidation, effusion, or pneumothorax.

Upper Abdomen: Negative

Musculoskeletal: Implantable loop recorder in the subcutaneous left
chest. Anterior right sixth and seventh healing rib fractures.
Incomplete segmentation of the left first and second ribs.
Degenerative glenohumeral spurring. Lucency in the posterior left
fifth rib has well-defined sclerotic margins compatible with benign
process.
IMPRESSION: 1. Right apical nodules on 02/12/2017 chest CT are resolved,
compatible with inflammatory process. No suspicious pulmonary
nodule.
2. Generalized airway thickening.
3. 2 cm right thyroid nodule, which meets size criteria for
recommended sonographic follow-up.
4. Healing right anterior sixth and seventh rib fractures.

## 2018-07-09 NOTE — Progress Notes (Signed)
Carelink Summary Report / Loop Recorder 

## 2018-07-12 ENCOUNTER — Other Ambulatory Visit: Payer: Self-pay

## 2018-07-26 ENCOUNTER — Ambulatory Visit (INDEPENDENT_AMBULATORY_CARE_PROVIDER_SITE_OTHER): Payer: BC Managed Care – PPO | Admitting: *Deleted

## 2018-07-26 DIAGNOSIS — I639 Cerebral infarction, unspecified: Secondary | ICD-10-CM

## 2018-07-27 LAB — CUP PACEART REMOTE DEVICE CHECK
Date Time Interrogation Session: 20200615233933
Implantable Pulse Generator Implant Date: 20190107

## 2018-08-01 NOTE — Progress Notes (Signed)
Carelink Summary Report / Loop Recorder 

## 2018-08-30 ENCOUNTER — Ambulatory Visit (INDEPENDENT_AMBULATORY_CARE_PROVIDER_SITE_OTHER): Payer: BC Managed Care – PPO | Admitting: *Deleted

## 2018-08-30 DIAGNOSIS — I639 Cerebral infarction, unspecified: Secondary | ICD-10-CM

## 2018-08-30 LAB — CUP PACEART REMOTE DEVICE CHECK
Date Time Interrogation Session: 20200719000834
Implantable Pulse Generator Implant Date: 20190107

## 2018-09-13 NOTE — Progress Notes (Signed)
Carelink Summary Report / Loop Recorder 

## 2018-09-15 ENCOUNTER — Other Ambulatory Visit: Payer: Self-pay | Admitting: Hematology & Oncology

## 2018-09-27 ENCOUNTER — Telehealth: Payer: Self-pay | Admitting: Hematology & Oncology

## 2018-09-27 NOTE — Telephone Encounter (Signed)
Patient calle don 8/14 to reschedule 9/2 appt for M-Th after 4:30 or Friday after 3:30.  I was unable to schedule times he requested.  I did scheduled for Friday October 16 @ 3:15. I LVM for him to call back and confirm

## 2018-09-30 ENCOUNTER — Ambulatory Visit (INDEPENDENT_AMBULATORY_CARE_PROVIDER_SITE_OTHER): Payer: BC Managed Care – PPO | Admitting: *Deleted

## 2018-09-30 DIAGNOSIS — I639 Cerebral infarction, unspecified: Secondary | ICD-10-CM

## 2018-10-03 LAB — CUP PACEART REMOTE DEVICE CHECK
Date Time Interrogation Session: 20200821001158
Implantable Pulse Generator Implant Date: 20190107

## 2018-10-07 NOTE — Progress Notes (Signed)
Carelink Summary Report / Loop Recorder 

## 2018-10-13 ENCOUNTER — Ambulatory Visit: Payer: BLUE CROSS/BLUE SHIELD | Admitting: Hematology & Oncology

## 2018-10-13 ENCOUNTER — Other Ambulatory Visit: Payer: BLUE CROSS/BLUE SHIELD

## 2018-11-02 ENCOUNTER — Ambulatory Visit (INDEPENDENT_AMBULATORY_CARE_PROVIDER_SITE_OTHER): Payer: BC Managed Care – PPO | Admitting: *Deleted

## 2018-11-02 DIAGNOSIS — I639 Cerebral infarction, unspecified: Secondary | ICD-10-CM | POA: Diagnosis not present

## 2018-11-02 LAB — CUP PACEART REMOTE DEVICE CHECK
Date Time Interrogation Session: 20200922230708
Implantable Pulse Generator Implant Date: 20190107

## 2018-11-09 NOTE — Progress Notes (Signed)
Carelink Summary Report / Loop Recorder 

## 2018-11-26 ENCOUNTER — Encounter: Payer: Self-pay | Admitting: Physical Medicine & Rehabilitation

## 2018-11-26 ENCOUNTER — Ambulatory Visit: Payer: Self-pay | Admitting: Hematology & Oncology

## 2018-11-26 ENCOUNTER — Encounter
Payer: BC Managed Care – PPO | Attending: Physical Medicine & Rehabilitation | Admitting: Physical Medicine & Rehabilitation

## 2018-11-26 ENCOUNTER — Other Ambulatory Visit: Payer: BC Managed Care – PPO

## 2018-11-26 ENCOUNTER — Other Ambulatory Visit: Payer: Self-pay

## 2018-11-26 VITALS — BP 132/84 | HR 77 | Temp 97.7°F | Ht 71.0 in | Wt 215.0 lb

## 2018-11-26 DIAGNOSIS — Z6832 Body mass index (BMI) 32.0-32.9, adult: Secondary | ICD-10-CM | POA: Diagnosis not present

## 2018-11-26 DIAGNOSIS — Z823 Family history of stroke: Secondary | ICD-10-CM | POA: Insufficient documentation

## 2018-11-26 DIAGNOSIS — R269 Unspecified abnormalities of gait and mobility: Secondary | ICD-10-CM | POA: Diagnosis not present

## 2018-11-26 DIAGNOSIS — I10 Essential (primary) hypertension: Secondary | ICD-10-CM | POA: Diagnosis not present

## 2018-11-26 DIAGNOSIS — I69390 Apraxia following cerebral infarction: Secondary | ICD-10-CM | POA: Insufficient documentation

## 2018-11-26 DIAGNOSIS — I6990 Unspecified sequelae of unspecified cerebrovascular disease: Secondary | ICD-10-CM

## 2018-11-26 DIAGNOSIS — Z8249 Family history of ischemic heart disease and other diseases of the circulatory system: Secondary | ICD-10-CM | POA: Insufficient documentation

## 2018-11-26 DIAGNOSIS — E669 Obesity, unspecified: Secondary | ICD-10-CM | POA: Diagnosis not present

## 2018-11-26 NOTE — Progress Notes (Signed)
Subjective:    Patient ID: [name], male    DOB: [birth year], 53 y.o.   MRN: [id number]  HPI  Male with history of HTN presents for follow up for right insular, parietal and cerebellar infarcts.     Last clinic visit 11/12/2017.  Since that time, he has had family issues and dealing with that. He is doing HEP. He has lost weight. Denies falls. He is following up with Neurology. He is back to work full duty.   Pain Inventory Average Pain 2 Pain Right Now 2 My pain is dull  In the last 24 hours, has pain interfered with the following? General activity 0 Relation with others 0 Enjoyment of life 0 What TIME of day is your pain at its worst? evening Sleep (in general) Good  Pain is worse with: walking Pain improves with: rest and heat/ice Relief from Meds: 6  Mobility walk without assistance ability to climb steps?  yes do you drive?  yes Do you have any goals in this area?  no  Function employed # of hrs/week 35  Neuro/Psych loss of taste or smell  Prior Studies Any changes since last visit?  no  Physicians involved in your care Any changes since last visit?  no Primary care Dr. Jillyn Ledger Dr. Myna Hidalgo   Family History  Problem Relation Age of Onset  . Hypertension Mother   . Hypertension Father   . Stroke Father   . Hypertension Sister   . Leukemia Paternal Uncle    Social History   Socioeconomic History  . Marital status: Married    Spouse name: Vickie  . Number of children: 0  . Years of education: College  . Highest education level: Not on file  Occupational History  . Occupation: unemployed-outside Airline pilot    Comment: [employer]  Social Needs  . Financial resource strain: Not on file  . Food insecurity    Worry: Not on file    Inability: Not on file  . Transportation needs    Medical: Not on file    Non-medical: Not on file  Tobacco Use  . Smoking status: Never Smoker  . Smokeless tobacco: Never Used  Substance and Sexual  Activity  . Alcohol use: Yes    Alcohol/week: 0.0 standard drinks    Comment: rarely, 1-2 times/year  . Drug use: No  . Sexual activity: Yes    Partners: Female  Lifestyle  . Physical activity    Days per week: Not on file    Minutes per session: Not on file  . Stress: Not on file  Relationships  . Social Musician on phone: Not on file    Gets together: Not on file    Attends religious service: Not on file    Active member of club or organization: Not on file    Attends meetings of clubs or organizations: Not on file    Relationship status: Not on file  Other Topics Concern  . Not on file  Social History Narrative   Lives with his wife, Vickie at home and 2 dogs.  Education [employer].  No children.  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    After 22 years, in 05/2017, he was let go by his employer upon his return to work after CVA.   Past Surgical History:  Procedure Laterality Date  . LOOP RECORDER INSERTION N/A 02/16/2017   Procedure: LOOP RECORDER INSERTION;  Surgeon: Regan Lemming, MD;  Location: [facility] INVASIVE  CV LAB;  Service: Cardiovascular;  Laterality: N/A;  . NO PAST SURGERIES    . TEE WITHOUT CARDIOVERSION N/A 02/16/2017   Procedure: TRANSESOPHAGEAL ECHOCARDIOGRAM (TEE);  Surgeon: Jerline Pain, MD;  Location: [facility] ENDOSCOPY;  Service: Cardiovascular;  Laterality: N/A;   Past Medical History:  Diagnosis Date  . Acute ischemic right MCA stroke ([place]) 02/17/2017  . Anti-cardiolipin antibody syndrome ([place]) 12/07/2017  . Environmental allergies   . Fall from roof    with kidney trauma  . High cholesterol   . Hypertension 2018  . Stroke ([place]) 02/12/2017   BP 132/84   Pulse 77   Temp 97.7 F (36.5 C)   Ht 5\' 11"  (1.803 m)   Wt 215 lb (97.5 kg)   SpO2 97%   BMI 29.99 kg/m   Opioid Risk Score:   Fall Risk Score:  `1  Depression screen PHQ 2/9  Depression screen [facility] 2/9 11/26/2018 11/12/2017 07/22/2017 05/12/2017 04/03/2017 03/13/2017 02/27/2017  Decreased Interest 0 0  0 0 0 0 0  Down, Depressed, Hopeless 0 0 0 0 0 0 0  PHQ - 2 Score 0 0 0 0 0 0 0    Review of Systems  Constitutional: Negative.  Negative for fatigue.  HENT: Negative.   Eyes: Negative.   Respiratory: Negative.   Cardiovascular: Negative.   Gastrointestinal: Negative.   Endocrine: Negative.   Genitourinary: Negative.   Musculoskeletal: Negative.   Skin: Negative.   Allergic/Immunologic: Negative.   Neurological: Negative.  Negative for weakness.  Hematological: Negative.   Psychiatric/Behavioral: Negative.       Objective:   Physical Exam Constitutional: No distress . Vital signs reviewed. HENT: Normocephalic.  Atraumatic. Eyes: EOMI. No discharge. Cardiovascular: No JVD. Respiratory: Normal effort.  No stridor. GI: Non-distended. Skin: Warm and dry.  Intact. Psych: Normal mood.  Normal behavior. Musc: No edema in extremities.  No tenderness in extremities. Gait: Mild LLE limp Neurological: He is alert and oriented.  Mild left facial weakness with mild dysarthria.  Motor:  LUE/LLE: 5/5 proximal to distal  Skin: Skin is warm and dry.  Psychiatric: He has a normal mood and affect. His behavior is normal. Thought content normal.     Assessment & Plan:  Male with history of HTN presents for follow up for right insular, parietal and cerebellar infarcts.    1. 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 Obesity: Resolved  3. Gait abnormality  Mild LLE limp, not posing issues

## 2018-12-06 ENCOUNTER — Ambulatory Visit (INDEPENDENT_AMBULATORY_CARE_PROVIDER_SITE_OTHER): Payer: BC Managed Care – PPO | Admitting: *Deleted

## 2018-12-06 DIAGNOSIS — I639 Cerebral infarction, unspecified: Secondary | ICD-10-CM

## 2018-12-06 LAB — CUP PACEART REMOTE DEVICE CHECK
Date Time Interrogation Session: 20201026001034
Implantable Pulse Generator Implant Date: 20190107

## 2018-12-17 ENCOUNTER — Encounter: Payer: Self-pay | Admitting: Hematology & Oncology

## 2018-12-17 ENCOUNTER — Inpatient Hospital Stay: Payer: BC Managed Care – PPO | Attending: Hematology & Oncology

## 2018-12-17 ENCOUNTER — Inpatient Hospital Stay (HOSPITAL_BASED_OUTPATIENT_CLINIC_OR_DEPARTMENT_OTHER): Payer: BC Managed Care – PPO | Admitting: Hematology & Oncology

## 2018-12-17 ENCOUNTER — Other Ambulatory Visit: Payer: Self-pay

## 2018-12-17 VITALS — BP 113/68 | HR 72 | Temp 97.3°F | Resp 16 | Wt 213.0 lb

## 2018-12-17 DIAGNOSIS — Z7982 Long term (current) use of aspirin: Secondary | ICD-10-CM | POA: Insufficient documentation

## 2018-12-17 DIAGNOSIS — D6861 Antiphospholipid syndrome: Secondary | ICD-10-CM | POA: Insufficient documentation

## 2018-12-17 DIAGNOSIS — Z8673 Personal history of transient ischemic attack (TIA), and cerebral infarction without residual deficits: Secondary | ICD-10-CM | POA: Insufficient documentation

## 2018-12-17 LAB — CMP (CANCER CENTER ONLY)
ALT: 28 U/L (ref 0–44)
AST: 20 U/L (ref 15–41)
Albumin: 4.5 g/dL (ref 3.5–5.0)
Alkaline Phosphatase: 77 U/L (ref 38–126)
Anion gap: 8 (ref 5–15)
BUN: 18 mg/dL (ref 6–20)
CO2: 28 mmol/L (ref 22–32)
Calcium: 9.6 mg/dL (ref 8.9–10.3)
Chloride: 104 mmol/L (ref 98–111)
Creatinine: 1.4 mg/dL — ABNORMAL HIGH (ref 0.61–1.24)
GFR, Est AFR Am: >60 mL/min
GFR, Estimated: 57 mL/min — ABNORMAL LOW
Glucose, Bld: 93 mg/dL (ref 70–99)
Potassium: 4.4 mmol/L (ref 3.5–5.1)
Sodium: 140 mmol/L (ref 135–145)
Total Bilirubin: 1.8 mg/dL — ABNORMAL HIGH (ref 0.3–1.2)
Total Protein: 7 g/dL (ref 6.5–8.1)

## 2018-12-17 LAB — CBC WITH DIFFERENTIAL (CANCER CENTER ONLY)
Abs Immature Granulocytes: 0.01 K/uL (ref 0.00–0.07)
Basophils Absolute: 0 K/uL (ref 0.0–0.1)
Basophils Relative: 0 %
Eosinophils Absolute: 0.1 K/uL (ref 0.0–0.5)
Eosinophils Relative: 1 %
HCT: 40.9 % (ref 39.0–52.0)
Hemoglobin: 14 g/dL (ref 13.0–17.0)
Immature Granulocytes: 0 %
Lymphocytes Relative: 30 %
Lymphs Abs: 2 K/uL (ref 0.7–4.0)
MCH: 31.4 pg (ref 26.0–34.0)
MCHC: 34.2 g/dL (ref 30.0–36.0)
MCV: 91.7 fL (ref 80.0–100.0)
Monocytes Absolute: 0.7 K/uL (ref 0.1–1.0)
Monocytes Relative: 10 %
Neutro Abs: 3.9 K/uL (ref 1.7–7.7)
Neutrophils Relative %: 59 %
Platelet Count: 256 K/uL (ref 150–400)
RBC: 4.46 MIL/uL (ref 4.22–5.81)
RDW: 11.8 % (ref 11.5–15.5)
WBC Count: 6.7 K/uL (ref 4.0–10.5)
nRBC: 0 % (ref 0.0–0.2)

## 2018-12-17 MED ORDER — FOLIC ACID 1 MG PO TABS: 1.0000 mg | ORAL_TABLET | Freq: Every day | ORAL | 6 refills | Status: DC

## 2018-12-17 NOTE — Progress Notes (Signed)
Hematology and Oncology Follow Up Visit  Morio Widen III [id number] Jul 19, 1965 53 y.o. 12/17/2018   Principle Diagnosis:   Anti-cardiolipin antibody syndrome-CVA  Current Therapy:    Aspirin 325 mg p.o. Daily  Folic acid 1 mg p.o. daily     Interim History:  Mr. Hensen is back for for follow-up.  He is doing pretty well.  It has been 8 months since we last saw him.  Because of the corona virus, it has been difficult for him to get to the office.  He is doing quite well.  He is on aspirin and folic acid.  He has had no problems at all with these.  There is been no bleeding.  He and his wife have been pretty busy.  They actually are going up to [facility] in [place].  When he was last here back in February, his IgG anticardiolipin antibody was 55.  The IgA anticardiolipin antibody was 15.  He has had no problems with headache.  He has had no cough or shortness of breath.  There is been no weakness.  He has had no leg swelling.  There is been no change in bowel or bladder habits.  Overall, his performance status is ECOG 1.  Medications:  Current Outpatient Medications:  .  acetaminophen (TYLENOL) 325 MG tablet, Take 650 mg by mouth every 6 (six) hours as needed for mild pain., Disp: , Rfl:  .  amLODipine (NORVASC) 10 MG tablet, Take 1 tablet (10 mg total) by mouth daily., Disp: 90 tablet, Rfl: 3 .  aspirin EC 325 MG EC tablet, Take 1 tablet (325 mg total) by mouth daily., Disp: 100 tablet, Rfl: 0 .  atorvastatin (LIPITOR) 40 MG tablet, Take 1 tablet (40 mg total) by mouth daily at 6 PM., Disp: 90 tablet, Rfl: 3 .  cetirizine (ZYRTEC) 10 MG tablet, Take 10 mg by mouth daily as needed for allergies., Disp: , Rfl:  .  famotidine (PEPCID) 20 MG tablet, Take 1 tablet (20 mg total) by mouth daily., Disp: 90 tablet, Rfl: 3 .  folic acid (FOLVITE) 1 MG tablet, Take 1 tablet (1 mg total) by mouth daily., Disp: 90 tablet, Rfl: 6 .  lisinopril (PRINIVIL,ZESTRIL) 20 MG tablet, Take 1 tablet  (20 mg total) by mouth daily., Disp: 90 tablet, Rfl: 3  Allergies:  Allergies  Allergen Reactions  . Other Other (See Comments)    Strawberries and blueberries  Strawberries and blueberries  . Penicillins Other (See Comments)    Headache Has patient had a PCN reaction causing immediate rash, facial/tongue/throat swelling, SOB or lightheadedness with hypotension: No Has patient had a PCN reaction causing severe rash involving mucus membranes or skin necrosis: No Has patient had a PCN reaction that required hospitalization: No Has patient had a PCN reaction occurring within the last 10 years:No If all of the above answers are "NO", then may proceed with Cephalosporin use.  Headache Has patient had a PCN reaction causing immediate rash, facial/tongue/throat swelling, SOB or lightheadedness with hypotension: No Has patient had a PCN reaction causing severe rash involving mucus membranes or skin necrosis: No Has patient had a PCN reaction that required hospitalization: No Has patient had a PCN reaction occurring within the last 10 years:No If all of the above answers are "NO", then may proceed with Cephalosporin use.    Past Medical History, Surgical history, Social history, and Family History were reviewed and updated.  Review of Systems: Review of Systems  Constitutional: Negative.   HENT:  Negative.   Eyes: Negative.   Respiratory: Negative.   Cardiovascular: Negative.   Gastrointestinal: Negative.   Endocrine: Negative.   Genitourinary: Negative.    Musculoskeletal: Negative.   Skin: Negative.   Neurological: Negative.   Hematological: Negative.   Psychiatric/Behavioral: Negative.     Physical Exam:  weight is 213 lb (96.6 kg). His temporal temperature is 97.3 F (36.3 C) (abnormal). His blood pressure is 113/68 and his pulse is 72. His respiration is 16 and oxygen saturation is 97%.   Wt Readings from Last 3 Encounters:  12/17/18 213 lb (96.6 kg)  11/26/18 215 lb  (97.5 kg)  04/09/18 222 lb 8 oz (100.9 kg)    Physical Exam Vitals signs reviewed.  HENT:     Head: Normocephalic and atraumatic.  Eyes:     Pupils: Pupils are equal, round, and reactive to light.  Neck:     Musculoskeletal: Normal range of motion.  Cardiovascular:     Rate and Rhythm: Normal rate and regular rhythm.     Heart sounds: Normal heart sounds.  Pulmonary:     Effort: Pulmonary effort is normal.     Breath sounds: Normal breath sounds.  Abdominal:     General: Bowel sounds are normal.     Palpations: Abdomen is soft.  Musculoskeletal: Normal range of motion.        General: No tenderness or deformity.  Lymphadenopathy:     Cervical: No cervical adenopathy.  Skin:    General: Skin is warm and dry.     Findings: No erythema or rash.  Neurological:     Mental Status: He is alert and oriented to person, place, and time.  Psychiatric:        Behavior: Behavior normal.        Thought Content: Thought content normal.        Judgment: Judgment normal.      Lab Results  Component Value Date   WBC 6.7 12/17/2018   HGB 14.0 12/17/2018   HCT 40.9 12/17/2018   MCV 91.7 12/17/2018   PLT 256 12/17/2018     Chemistry      Component Value Date/Time   NA 140 12/17/2018 1431   NA 146 (H) 05/12/2017 0934   K 4.4 12/17/2018 1431   CL 104 12/17/2018 1431   CO2 28 12/17/2018 1431   BUN 18 12/17/2018 1431   BUN 16 05/12/2017 0934   CREATININE 1.40 (H) 12/17/2018 1431      Component Value Date/Time   CALCIUM 9.6 12/17/2018 1431   ALKPHOS 77 12/17/2018 1431   AST 20 12/17/2018 1431   ALT 28 12/17/2018 1431   BILITOT 1.8 (H) 12/17/2018 1431         Impression and Plan: Mr. Goyer is a 53 year old white male.  He had a CVA in January 2019.  The CVA was in the right posterior insula and right lateral parietal lobe.  He has elevated IgG and IgA anticardiolipin antibodies.  He has an elevated homocystine level.  He will clearly need lifelong anticoagulation.  He  is on aspirin and folic acid right now.   If he has another event, he will definitely need formal anticoagulation with Coumadin.  Unfortunately, Coumadin does seem to be much more effective with thromboembolic disease secondary to cardiolipin antibodies.    I am happy that he is doing better.  He looks better.  He feels better.  We will plan to get him back now in about 8 months.  I  do not see that we have to do any scans on him.   Josph MachoPeter R Ennever, MD 11/6/20203:41 PM

## 2018-12-18 LAB — LUPUS ANTICOAGULANT PANEL
DRVVT: 36.2 s (ref 0.0–47.0)
PTT Lupus Anticoagulant: 36.8 s (ref 0.0–51.9)

## 2018-12-19 LAB — CARDIOLIPIN ANTIBODIES, IGG, IGM, IGA
Anticardiolipin IgA: 15 APL U/mL — ABNORMAL HIGH (ref 0–11)
Anticardiolipin IgG: 85 GPL U/mL — ABNORMAL HIGH (ref 0–14)
Anticardiolipin IgM: <9 MPL U/mL (ref 0–12)

## 2018-12-23 NOTE — Progress Notes (Signed)
Carelink Summary Report / Loop Recorder 

## 2019-01-09 LAB — CUP PACEART REMOTE DEVICE CHECK
Date Time Interrogation Session: 20201127191004
Implantable Pulse Generator Implant Date: 20190107

## 2019-01-10 ENCOUNTER — Ambulatory Visit (INDEPENDENT_AMBULATORY_CARE_PROVIDER_SITE_OTHER): Payer: BC Managed Care – PPO | Admitting: *Deleted

## 2019-01-10 DIAGNOSIS — Z8673 Personal history of transient ischemic attack (TIA), and cerebral infarction without residual deficits: Secondary | ICD-10-CM

## 2019-02-02 NOTE — Progress Notes (Signed)
ILR remote 

## 2019-02-10 ENCOUNTER — Ambulatory Visit (INDEPENDENT_AMBULATORY_CARE_PROVIDER_SITE_OTHER): Payer: BC Managed Care – PPO | Admitting: *Deleted

## 2019-02-10 DIAGNOSIS — Z8673 Personal history of transient ischemic attack (TIA), and cerebral infarction without residual deficits: Secondary | ICD-10-CM | POA: Diagnosis not present

## 2019-02-10 LAB — CUP PACEART REMOTE DEVICE CHECK
Date Time Interrogation Session: 20201230231909
Implantable Pulse Generator Implant Date: 20190107

## 2019-02-16 ENCOUNTER — Telehealth: Payer: Self-pay

## 2019-02-16 NOTE — Telephone Encounter (Signed)
LMOVM for pt not to send manual transmissions unless we ask for one.

## 2019-03-14 ENCOUNTER — Ambulatory Visit (INDEPENDENT_AMBULATORY_CARE_PROVIDER_SITE_OTHER): Payer: BC Managed Care – PPO | Admitting: *Deleted

## 2019-03-14 DIAGNOSIS — Z8673 Personal history of transient ischemic attack (TIA), and cerebral infarction without residual deficits: Secondary | ICD-10-CM | POA: Diagnosis not present

## 2019-03-14 LAB — CUP PACEART REMOTE DEVICE CHECK
Date Time Interrogation Session: 20210201002711
Implantable Pulse Generator Implant Date: 20190107

## 2019-03-14 NOTE — Progress Notes (Signed)
ILR Remote 

## 2019-04-14 ENCOUNTER — Ambulatory Visit (INDEPENDENT_AMBULATORY_CARE_PROVIDER_SITE_OTHER): Payer: BC Managed Care – PPO | Admitting: *Deleted

## 2019-04-14 DIAGNOSIS — Z8673 Personal history of transient ischemic attack (TIA), and cerebral infarction without residual deficits: Secondary | ICD-10-CM

## 2019-04-14 LAB — CUP PACEART REMOTE DEVICE CHECK
Date Time Interrogation Session: 20210304030925
Implantable Pulse Generator Implant Date: 20190107

## 2019-04-14 NOTE — Progress Notes (Signed)
ILR Remote 

## 2019-05-16 ENCOUNTER — Ambulatory Visit (INDEPENDENT_AMBULATORY_CARE_PROVIDER_SITE_OTHER): Payer: BC Managed Care – PPO | Admitting: *Deleted

## 2019-05-16 DIAGNOSIS — Z8673 Personal history of transient ischemic attack (TIA), and cerebral infarction without residual deficits: Secondary | ICD-10-CM

## 2019-05-16 LAB — CUP PACEART REMOTE DEVICE CHECK
Date Time Interrogation Session: 20210404041218
Implantable Pulse Generator Implant Date: 20190107

## 2019-05-17 NOTE — Progress Notes (Signed)
ILR Remote 

## 2019-06-16 LAB — CUP PACEART REMOTE DEVICE CHECK
Date Time Interrogation Session: 20210505041602
Implantable Pulse Generator Implant Date: 20190107

## 2019-06-20 ENCOUNTER — Ambulatory Visit (INDEPENDENT_AMBULATORY_CARE_PROVIDER_SITE_OTHER): Payer: Self-pay | Admitting: *Deleted

## 2019-06-20 DIAGNOSIS — I639 Cerebral infarction, unspecified: Secondary | ICD-10-CM

## 2019-06-20 NOTE — Progress Notes (Signed)
Carelink Summary Report / Loop Recorder 

## 2019-07-25 ENCOUNTER — Ambulatory Visit (INDEPENDENT_AMBULATORY_CARE_PROVIDER_SITE_OTHER): Payer: Self-pay | Admitting: *Deleted

## 2019-07-25 DIAGNOSIS — I639 Cerebral infarction, unspecified: Secondary | ICD-10-CM

## 2019-07-25 LAB — CUP PACEART REMOTE DEVICE CHECK
Date Time Interrogation Session: 20210613234656
Implantable Pulse Generator Implant Date: 20190107

## 2019-07-25 NOTE — Progress Notes (Signed)
Carelink Summary Report / Loop Recorder 

## 2019-07-27 ENCOUNTER — Telehealth: Payer: Self-pay | Admitting: Hematology & Oncology

## 2019-07-27 NOTE — Telephone Encounter (Signed)
Appointments rescheduled due to provider PAL updated calendar & letter mailed  

## 2019-08-26 ENCOUNTER — Encounter: Payer: Self-pay | Admitting: Family

## 2019-08-26 ENCOUNTER — Other Ambulatory Visit: Payer: Self-pay

## 2019-08-26 ENCOUNTER — Inpatient Hospital Stay (HOSPITAL_BASED_OUTPATIENT_CLINIC_OR_DEPARTMENT_OTHER): Payer: BC Managed Care – PPO | Admitting: Family

## 2019-08-26 ENCOUNTER — Other Ambulatory Visit: Payer: BC Managed Care – PPO

## 2019-08-26 ENCOUNTER — Inpatient Hospital Stay: Payer: BC Managed Care – PPO | Attending: Hematology & Oncology

## 2019-08-26 ENCOUNTER — Ambulatory Visit: Payer: BC Managed Care – PPO | Admitting: Hematology & Oncology

## 2019-08-26 VITALS — BP 110/66 | HR 75 | Temp 99.7°F | Resp 18 | Wt 205.0 lb

## 2019-08-26 DIAGNOSIS — Z7982 Long term (current) use of aspirin: Secondary | ICD-10-CM | POA: Insufficient documentation

## 2019-08-26 DIAGNOSIS — Z8673 Personal history of transient ischemic attack (TIA), and cerebral infarction without residual deficits: Secondary | ICD-10-CM

## 2019-08-26 DIAGNOSIS — D6861 Antiphospholipid syndrome: Secondary | ICD-10-CM

## 2019-08-26 DIAGNOSIS — Z79899 Other long term (current) drug therapy: Secondary | ICD-10-CM | POA: Insufficient documentation

## 2019-08-26 LAB — CBC WITH DIFFERENTIAL (CANCER CENTER ONLY)
Abs Immature Granulocytes: 0.02 10*3/uL (ref 0.00–0.07)
Basophils Absolute: 0 10*3/uL (ref 0.0–0.1)
Basophils Relative: 0 %
Eosinophils Absolute: 0 10*3/uL (ref 0.0–0.5)
Eosinophils Relative: 0 %
HCT: 41.9 % (ref 39.0–52.0)
Hemoglobin: 14 g/dL (ref 13.0–17.0)
Immature Granulocytes: 0 %
Lymphocytes Relative: 27 %
Lymphs Abs: 1.9 10*3/uL (ref 0.7–4.0)
MCH: 31.7 pg (ref 26.0–34.0)
MCHC: 33.4 g/dL (ref 30.0–36.0)
MCV: 95 fL (ref 80.0–100.0)
Monocytes Absolute: 0.6 10*3/uL (ref 0.1–1.0)
Monocytes Relative: 8 %
Neutro Abs: 4.7 10*3/uL (ref 1.7–7.7)
Neutrophils Relative %: 65 %
Platelet Count: 223 10*3/uL (ref 150–400)
RBC: 4.41 MIL/uL (ref 4.22–5.81)
RDW: 12.1 % (ref 11.5–15.5)
WBC Count: 7.3 10*3/uL (ref 4.0–10.5)
nRBC: 0 % (ref 0.0–0.2)

## 2019-08-26 LAB — CMP (CANCER CENTER ONLY)
ALT: 26 U/L (ref 0–44)
AST: 21 U/L (ref 15–41)
Albumin: 4.5 g/dL (ref 3.5–5.0)
Alkaline Phosphatase: 88 U/L (ref 38–126)
Anion gap: 8 (ref 5–15)
BUN: 24 mg/dL — ABNORMAL HIGH (ref 6–20)
CO2: 28 mmol/L (ref 22–32)
Calcium: 9.5 mg/dL (ref 8.9–10.3)
Chloride: 106 mmol/L (ref 98–111)
Creatinine: 1.55 mg/dL — ABNORMAL HIGH (ref 0.61–1.24)
GFR, Est AFR Am: 58 mL/min — ABNORMAL LOW (ref >60)
GFR, Estimated: 50 mL/min — ABNORMAL LOW (ref >60)
Glucose, Bld: 70 mg/dL (ref 70–99)
Potassium: 3.9 mmol/L (ref 3.5–5.1)
Sodium: 142 mmol/L (ref 135–145)
Total Bilirubin: 1.9 mg/dL — ABNORMAL HIGH (ref 0.3–1.2)
Total Protein: 6.9 g/dL (ref 6.5–8.1)

## 2019-08-26 NOTE — Progress Notes (Signed)
Hematology and Oncology Follow Up Visit  [name] [id number] Oct 25, 1965 54 y.o. 08/26/2019   Principle Diagnosis:  Anti-cardiolipin antibody syndrome - CVA  Current Therapy:        Aspirin 325 mg p.o. Daily Folic acid 1 mg p.o. daily   Interim History:  [name] is here today for follow-up. He continues to do well and has no complaints at this time.  He states that he is still taking his folic acid and full dose aspirin daily.  No episodes of bleeding. No bruising or petechiae.  He is scheduled for a routine colonoscopy next week on Monday.  No fever, chills, n/v, cough, rash, dizziness, SOB, chest pain, palpitations, abdominal pain or changes in bowel or bladder habits.  He has some left sided residual weakness in his arm and leg. He is going to the gym regularly and working out to improve his strength.  No falls or syncopal episodes to report.  No swelling noted on exam.  He has maintained a good appetite and is staying well hydrated. His weight is stable.   ECOG Performance Status: 0 - Asymptomatic  Medications:  Allergies as of 08/26/2019      Reactions   Other Other (See Comments)   Strawberries and blueberries Strawberries and blueberries   Penicillins Other (See Comments)   Headache Has patient had a PCN reaction causing immediate rash, facial/tongue/throat swelling, SOB or lightheadedness with hypotension: No Has patient had a PCN reaction causing severe rash involving mucus membranes or skin necrosis: No Has patient had a PCN reaction that required hospitalization: No Has patient had a PCN reaction occurring within the last 10 years:No If all of the above answers are "NO", then may proceed with Cephalosporin use. Headache Has patient had a PCN reaction causing immediate rash, facial/tongue/throat swelling, SOB or lightheadedness with hypotension: No Has patient had a PCN reaction causing severe rash involving mucus membranes or skin necrosis: No Has patient  had a PCN reaction that required hospitalization: No Has patient had a PCN reaction occurring within the last 10 years:No If all of the above answers are "NO", then may proceed with Cephalosporin use.      Medication List       Accurate as of August 26, 2019  2:41 PM. If you have any questions, ask your nurse or doctor.        acetaminophen 325 MG tablet Commonly known as: TYLENOL Take 650 mg by mouth every 6 (six) hours as needed for mild pain.   amLODipine 10 MG tablet Commonly known as: NORVASC Take 1 tablet (10 mg total) by mouth daily.   aspirin 325 MG EC tablet Take 1 tablet (325 mg total) by mouth daily.   atorvastatin 40 MG tablet Commonly known as: LIPITOR Take 1 tablet (40 mg total) by mouth daily at 6 PM.   cetirizine 10 MG tablet Commonly known as: ZYRTEC Take 10 mg by mouth daily as needed for allergies.   escitalopram 10 MG tablet Commonly known as: LEXAPRO Take 10 mg by mouth daily.   famotidine 20 MG tablet Commonly known as: PEPCID Take 1 tablet (20 mg total) by mouth daily.   folic acid 1 MG tablet Commonly known as: FOLVITE Take 1 tablet (1 mg total) by mouth daily.   lisinopril 20 MG tablet Commonly known as: ZESTRIL Take 1 tablet (20 mg total) by mouth daily.       Allergies:  Allergies  Allergen Reactions  . Other Other (See Comments)  Strawberries and blueberries  Strawberries and blueberries  . Penicillins Other (See Comments)    Headache Has patient had a PCN reaction causing immediate rash, facial/tongue/throat swelling, SOB or lightheadedness with hypotension: No Has patient had a PCN reaction causing severe rash involving mucus membranes or skin necrosis: No Has patient had a PCN reaction that required hospitalization: No Has patient had a PCN reaction occurring within the last 10 years:No If all of the above answers are "NO", then may proceed with Cephalosporin use.  Headache Has patient had a PCN reaction causing  immediate rash, facial/tongue/throat swelling, SOB or lightheadedness with hypotension: No Has patient had a PCN reaction causing severe rash involving mucus membranes or skin necrosis: No Has patient had a PCN reaction that required hospitalization: No Has patient had a PCN reaction occurring within the last 10 years:No If all of the above answers are "NO", then may proceed with Cephalosporin use.    Past Medical History, Surgical history, Social history, and Family History were reviewed and updated.  Review of Systems: All other 10 point review of systems is negative.   Physical Exam:  weight is 205 lb (93 kg). His oral temperature is 99.7 F (37.6 C). His blood pressure is 110/66 and his pulse is 75. His respiration is 18 and oxygen saturation is 100%.   Wt Readings from Last 3 Encounters:  08/26/19 205 lb (93 kg)  12/17/18 213 lb (96.6 kg)  11/26/18 215 lb (97.5 kg)    Ocular: Sclerae unicteric, pupils equal, round and reactive to light Ear-nose-throat: Oropharynx clear, dentition fair Lymphatic: No cervical or supraclavicular adenopathy Lungs no rales or rhonchi, good excursion bilaterally Heart regular rate and rhythm, no murmur appreciated Abd soft, nontender, positive bowel sounds, no liver or spleen tip palpated on exam, no fluid wave  MSK no focal spinal tenderness, no joint edema Neuro: non-focal, well-oriented, appropriate affect Breasts: Deferred   Lab Results  Component Value Date   WBC 7.3 08/26/2019   HGB 14.0 08/26/2019   HCT 41.9 08/26/2019   MCV 95.0 08/26/2019   PLT 223 08/26/2019   No results found for: FERRITIN, IRON, TIBC, UIBC, IRONPCTSAT Lab Results  Component Value Date   RBC 4.41 08/26/2019   No results found for: KPAFRELGTCHN, LAMBDASER, KAPLAMBRATIO No results found for: IGGSERUM, IGA, IGMSERUM No results found for: Marda Stalker, SPEI   Chemistry      Component Value Date/Time   NA  140 12/17/2018 1431   NA 146 (H) 05/12/2017 0934   K 4.4 12/17/2018 1431   CL 104 12/17/2018 1431   CO2 28 12/17/2018 1431   BUN 18 12/17/2018 1431   BUN 16 05/12/2017 0934   CREATININE 1.40 (H) 12/17/2018 1431      Component Value Date/Time   CALCIUM 9.6 12/17/2018 1431   ALKPHOS 77 12/17/2018 1431   AST 20 12/17/2018 1431   ALT 28 12/17/2018 1431   BILITOT 1.8 (H) 12/17/2018 1431       Impression and Plan: Mr. Thayer is a very pleasant 54 yo caucasian gentleman with history of CVA with anti-cardiolipin antibody syndrome.  So far, he has done well on full dose aspirin and folic acid and has had no recurrent events.  He will continue his same regimen.  We will now plan to see him back in a year.  He promises to contact our office with any questions or concerns. We can certainly see him sooner if needed.   Emeline Gins,  NP 7/16/20212:41 PM

## 2019-08-29 ENCOUNTER — Ambulatory Visit (INDEPENDENT_AMBULATORY_CARE_PROVIDER_SITE_OTHER): Payer: Self-pay | Admitting: *Deleted

## 2019-08-29 DIAGNOSIS — I639 Cerebral infarction, unspecified: Secondary | ICD-10-CM

## 2019-08-29 LAB — CUP PACEART REMOTE DEVICE CHECK
Date Time Interrogation Session: 20210718231109
Implantable Pulse Generator Implant Date: 20190107

## 2019-08-30 NOTE — Progress Notes (Signed)
Carelink Summary Report / Loop Recorder 

## 2019-10-02 LAB — CUP PACEART REMOTE DEVICE CHECK
Date Time Interrogation Session: 20210819000956
Implantable Pulse Generator Implant Date: 20190107

## 2019-10-03 ENCOUNTER — Ambulatory Visit (INDEPENDENT_AMBULATORY_CARE_PROVIDER_SITE_OTHER): Payer: Self-pay | Admitting: *Deleted

## 2019-10-03 DIAGNOSIS — I639 Cerebral infarction, unspecified: Secondary | ICD-10-CM

## 2019-10-03 NOTE — Progress Notes (Signed)
Carelink Summary Report / Loop Recorder 

## 2019-11-06 LAB — CUP PACEART REMOTE DEVICE CHECK
Date Time Interrogation Session: 20210919002916
Implantable Pulse Generator Implant Date: 20190107

## 2019-11-07 ENCOUNTER — Ambulatory Visit (INDEPENDENT_AMBULATORY_CARE_PROVIDER_SITE_OTHER): Payer: Self-pay | Admitting: Emergency Medicine

## 2019-11-07 DIAGNOSIS — I639 Cerebral infarction, unspecified: Secondary | ICD-10-CM

## 2019-11-09 NOTE — Progress Notes (Signed)
Carelink Summary Report / Loop Recorder 

## 2019-11-30 ENCOUNTER — Ambulatory Visit (INDEPENDENT_AMBULATORY_CARE_PROVIDER_SITE_OTHER): Payer: Self-pay

## 2019-11-30 DIAGNOSIS — I639 Cerebral infarction, unspecified: Secondary | ICD-10-CM

## 2019-11-30 LAB — CUP PACEART REMOTE DEVICE CHECK
Date Time Interrogation Session: 20211020021338
Implantable Pulse Generator Implant Date: 20190107

## 2019-12-06 NOTE — Progress Notes (Signed)
Carelink Summary Report / Loop Recorder 

## 2019-12-31 LAB — CUP PACEART REMOTE DEVICE CHECK
Date Time Interrogation Session: 20211120013217
Implantable Pulse Generator Implant Date: 20190107

## 2020-01-02 ENCOUNTER — Ambulatory Visit (INDEPENDENT_AMBULATORY_CARE_PROVIDER_SITE_OTHER): Payer: Self-pay

## 2020-01-02 DIAGNOSIS — I639 Cerebral infarction, unspecified: Secondary | ICD-10-CM

## 2020-01-03 NOTE — Progress Notes (Signed)
Carelink Summary Report / Loop Recorder 

## 2020-01-31 LAB — CUP PACEART REMOTE DEVICE CHECK
Date Time Interrogation Session: 20211221013255
Implantable Pulse Generator Implant Date: 20190107

## 2020-02-06 ENCOUNTER — Ambulatory Visit (INDEPENDENT_AMBULATORY_CARE_PROVIDER_SITE_OTHER): Payer: Self-pay

## 2020-02-06 DIAGNOSIS — I639 Cerebral infarction, unspecified: Secondary | ICD-10-CM

## 2020-02-13 ENCOUNTER — Other Ambulatory Visit: Payer: Self-pay | Admitting: *Deleted

## 2020-02-13 MED ORDER — FOLIC ACID 1 MG PO TABS: 1.0000 mg | ORAL_TABLET | Freq: Every day | ORAL | 6 refills | Status: DC

## 2020-02-20 NOTE — Progress Notes (Signed)
Carelink Summary Report / Loop Recorder 

## 2020-03-11 LAB — CUP PACEART REMOTE DEVICE CHECK
Date Time Interrogation Session: 20220129231137
Implantable Pulse Generator Implant Date: 20190107

## 2020-03-12 ENCOUNTER — Ambulatory Visit (INDEPENDENT_AMBULATORY_CARE_PROVIDER_SITE_OTHER): Payer: BC Managed Care – PPO

## 2020-03-12 DIAGNOSIS — I639 Cerebral infarction, unspecified: Secondary | ICD-10-CM | POA: Diagnosis not present

## 2020-03-20 NOTE — Progress Notes (Signed)
Carelink Summary Report / Loop Recorder 

## 2020-04-16 ENCOUNTER — Ambulatory Visit (INDEPENDENT_AMBULATORY_CARE_PROVIDER_SITE_OTHER): Payer: Self-pay

## 2020-04-16 DIAGNOSIS — I639 Cerebral infarction, unspecified: Secondary | ICD-10-CM

## 2020-04-18 LAB — CUP PACEART REMOTE DEVICE CHECK
Date Time Interrogation Session: 20220301231812
Implantable Pulse Generator Implant Date: 20190107

## 2020-04-24 NOTE — Progress Notes (Signed)
Carelink Summary Report / Loop Recorder 

## 2020-05-19 LAB — CUP PACEART REMOTE DEVICE CHECK
Date Time Interrogation Session: 20220402001933
Implantable Pulse Generator Implant Date: 20190107

## 2020-05-21 ENCOUNTER — Ambulatory Visit (INDEPENDENT_AMBULATORY_CARE_PROVIDER_SITE_OTHER): Payer: Self-pay

## 2020-05-21 DIAGNOSIS — I639 Cerebral infarction, unspecified: Secondary | ICD-10-CM

## 2020-06-01 NOTE — Progress Notes (Signed)
Carelink Summary Report / Loop Recorder 

## 2020-06-25 ENCOUNTER — Ambulatory Visit (INDEPENDENT_AMBULATORY_CARE_PROVIDER_SITE_OTHER): Payer: Self-pay

## 2020-06-25 DIAGNOSIS — I639 Cerebral infarction, unspecified: Secondary | ICD-10-CM

## 2020-06-26 LAB — CUP PACEART REMOTE DEVICE CHECK
Date Time Interrogation Session: 20220514231058
Implantable Pulse Generator Implant Date: 20190107

## 2020-07-17 NOTE — Progress Notes (Signed)
Carelink Summary Report / Loop Recorder 

## 2020-07-24 ENCOUNTER — Telehealth: Payer: Self-pay

## 2020-07-30 ENCOUNTER — Ambulatory Visit (INDEPENDENT_AMBULATORY_CARE_PROVIDER_SITE_OTHER): Payer: Self-pay

## 2020-07-30 DIAGNOSIS — I639 Cerebral infarction, unspecified: Secondary | ICD-10-CM

## 2020-08-01 LAB — CUP PACEART REMOTE DEVICE CHECK
Date Time Interrogation Session: 20220614232848
Implantable Pulse Generator Implant Date: 20190107

## 2020-08-16 NOTE — Progress Notes (Signed)
Carelink Summary Report / Loop Recorder 

## 2020-08-24 ENCOUNTER — Other Ambulatory Visit: Payer: BC Managed Care – PPO

## 2020-08-24 ENCOUNTER — Ambulatory Visit: Payer: BC Managed Care – PPO | Admitting: Hematology & Oncology

## 2020-08-29 LAB — CUP PACEART REMOTE DEVICE CHECK
Date Time Interrogation Session: 20220716002655
Implantable Pulse Generator Implant Date: 20190107

## 2020-09-03 ENCOUNTER — Telehealth: Payer: Self-pay

## 2020-09-03 NOTE — Telephone Encounter (Signed)
Patient marked "I" in paceart & remotes canceled.   After patient is contacted will need to remove from [facility] / send return kit.

## 2020-09-03 NOTE — Telephone Encounter (Signed)
Called patient to advise ILR @ [facility]. No answer, LMTCB.

## 2020-09-06 NOTE — Telephone Encounter (Signed)
Successful telephone encounter to patient to discuss ILR @ [facility]. Discussed options for removal or to leave in place. Patient wishes to leave in place at this time. He will notify office if he changes his mind. Will remove from [facility] and send return kit. Patient appreciative of call.

## 2020-09-12 ENCOUNTER — Other Ambulatory Visit: Payer: Self-pay | Admitting: *Deleted

## 2020-09-12 DIAGNOSIS — D6861 Antiphospholipid syndrome: Secondary | ICD-10-CM

## 2020-09-13 ENCOUNTER — Inpatient Hospital Stay: Payer: BC Managed Care – PPO | Attending: Hematology & Oncology

## 2020-09-13 ENCOUNTER — Other Ambulatory Visit: Payer: Self-pay

## 2020-09-13 ENCOUNTER — Encounter: Payer: Self-pay | Admitting: Hematology & Oncology

## 2020-09-13 ENCOUNTER — Inpatient Hospital Stay (HOSPITAL_BASED_OUTPATIENT_CLINIC_OR_DEPARTMENT_OTHER): Payer: BC Managed Care – PPO | Admitting: Hematology & Oncology

## 2020-09-13 VITALS — BP 108/57 | HR 71 | Temp 98.9°F | Resp 19 | Wt 212.0 lb

## 2020-09-13 DIAGNOSIS — Z7982 Long term (current) use of aspirin: Secondary | ICD-10-CM | POA: Diagnosis not present

## 2020-09-13 DIAGNOSIS — D6861 Antiphospholipid syndrome: Secondary | ICD-10-CM

## 2020-09-13 DIAGNOSIS — Z8673 Personal history of transient ischemic attack (TIA), and cerebral infarction without residual deficits: Secondary | ICD-10-CM | POA: Insufficient documentation

## 2020-09-13 DIAGNOSIS — Z7901 Long term (current) use of anticoagulants: Secondary | ICD-10-CM | POA: Insufficient documentation

## 2020-09-13 LAB — CMP (CANCER CENTER ONLY)
ALT: 41 U/L (ref 0–44)
AST: 28 U/L (ref 15–41)
Albumin: 4.1 g/dL (ref 3.5–5.0)
Alkaline Phosphatase: 88 U/L (ref 38–126)
Anion gap: 7 (ref 5–15)
BUN: 23 mg/dL — ABNORMAL HIGH (ref 6–20)
CO2: 27 mmol/L (ref 22–32)
Calcium: 9.2 mg/dL (ref 8.9–10.3)
Chloride: 108 mmol/L (ref 98–111)
Creatinine: 1.58 mg/dL — ABNORMAL HIGH (ref 0.61–1.24)
GFR, Estimated: 52 mL/min — ABNORMAL LOW (ref >60)
Glucose, Bld: 90 mg/dL (ref 70–99)
Potassium: 4.6 mmol/L (ref 3.5–5.1)
Sodium: 142 mmol/L (ref 135–145)
Total Bilirubin: 1.5 mg/dL — ABNORMAL HIGH (ref 0.3–1.2)
Total Protein: 6.7 g/dL (ref 6.5–8.1)

## 2020-09-13 LAB — CBC WITH DIFFERENTIAL (CANCER CENTER ONLY)
Abs Immature Granulocytes: 0.01 10*3/uL (ref 0.00–0.07)
Basophils Absolute: 0 10*3/uL (ref 0.0–0.1)
Basophils Relative: 0 %
Eosinophils Absolute: 0 10*3/uL (ref 0.0–0.5)
Eosinophils Relative: 0 %
HCT: 41.4 % (ref 39.0–52.0)
Hemoglobin: 14.3 g/dL (ref 13.0–17.0)
Immature Granulocytes: 0 %
Lymphocytes Relative: 29 %
Lymphs Abs: 2 10*3/uL (ref 0.7–4.0)
MCH: 31.8 pg (ref 26.0–34.0)
MCHC: 34.5 g/dL (ref 30.0–36.0)
MCV: 92 fL (ref 80.0–100.0)
Monocytes Absolute: 0.6 10*3/uL (ref 0.1–1.0)
Monocytes Relative: 8 %
Neutro Abs: 4.3 10*3/uL (ref 1.7–7.7)
Neutrophils Relative %: 63 %
Platelet Count: 229 10*3/uL (ref 150–400)
RBC: 4.5 MIL/uL (ref 4.22–5.81)
RDW: 12 % (ref 11.5–15.5)
WBC Count: 6.9 10*3/uL (ref 4.0–10.5)
nRBC: 0 % (ref 0.0–0.2)

## 2020-09-13 NOTE — Progress Notes (Signed)
Hematology and Oncology Follow Up Visit  [name] [id number] 07-11-1965 55 y.o. 09/13/2020   Principle Diagnosis:  Anti-cardiolipin antibody syndrome-CVA  Current Therapy:   Aspirin 325 mg p.o. Daily Folic acid 1 mg p.o. daily     Interim History:  [name] is back for for follow-up.  So far, he has had no problems.  Everything is looking quite good for him.  We last saw him about a year or so ago.  He did have COVID.  I think this was back around Thanksgiving.  Thankfully, he did not have any issues with blood clotting.  Thankfully he was not hospitalized.   He has a new job.  He is quite busy at his job.  Thankfully, he gets 5 weeks of vacation.  He has had no problems with the aspirin or the folic acid.  There is no headaches.  He has had no visual changes.  He has had no weakness.  He has had no change in bowel or bladder habits.  He has had no rashes.  When we last saw him and checked his anticardiolipin antibodies, his IgG anticardiolipin antibody was 85.  The beta-2 glycoprotein was over 150.  Overall, I would say performance status is ECOG 0.    Medications:  Current Outpatient Medications:    acetaminophen (TYLENOL) 325 MG tablet, Take 650 mg by mouth every 6 (six) hours as needed for mild pain., Disp: , Rfl:    amLODipine (NORVASC) 10 MG tablet, Take 1 tablet (10 mg total) by mouth daily., Disp: 90 tablet, Rfl: 3   aspirin EC 325 MG EC tablet, Take 1 tablet (325 mg total) by mouth daily., Disp: 100 tablet, Rfl: 0   atorvastatin (LIPITOR) 40 MG tablet, Take 1 tablet (40 mg total) by mouth daily at 6 PM., Disp: 90 tablet, Rfl: 3   cetirizine (ZYRTEC) 10 MG tablet, Take 10 mg by mouth daily as needed for allergies., Disp: , Rfl:    escitalopram (LEXAPRO) 10 MG tablet, Take 10 mg by mouth daily., Disp: , Rfl:    famotidine (PEPCID) 20 MG tablet, Take 1 tablet (20 mg total) by mouth daily., Disp: 90 tablet, Rfl: 3   folic acid (FOLVITE) 1 MG tablet, Take 1 tablet (1  mg total) by mouth daily., Disp: 90 tablet, Rfl: 6   lisinopril (PRINIVIL,ZESTRIL) 20 MG tablet, Take 1 tablet (20 mg total) by mouth daily., Disp: 90 tablet, Rfl: 3  Allergies:  Allergies  Allergen Reactions   Other Other (See Comments)    Strawberries and blueberries  Strawberries and blueberries   Penicillins Other (See Comments)    Headache Has patient had a PCN reaction causing immediate rash, facial/tongue/throat swelling, SOB or lightheadedness with hypotension: No Has patient had a PCN reaction causing severe rash involving mucus membranes or skin necrosis: No Has patient had a PCN reaction that required hospitalization: No Has patient had a PCN reaction occurring within the last 10 years:No If all of the above answers are "NO", then may proceed with Cephalosporin use.  Headache Has patient had a PCN reaction causing immediate rash, facial/tongue/throat swelling, SOB or lightheadedness with hypotension: No Has patient had a PCN reaction causing severe rash involving mucus membranes or skin necrosis: No Has patient had a PCN reaction that required hospitalization: No Has patient had a PCN reaction occurring within the last 10 years:No If all of the above answers are "NO", then may proceed with Cephalosporin use.    Past Medical History, Surgical history,  Social history, and Family History were reviewed and updated.  Review of Systems: Review of Systems  Constitutional: Negative.   HENT:  Negative.    Eyes: Negative.   Respiratory: Negative.    Cardiovascular: Negative.   Gastrointestinal: Negative.   Endocrine: Negative.   Genitourinary: Negative.    Musculoskeletal: Negative.   Skin: Negative.   Neurological: Negative.   Hematological: Negative.   Psychiatric/Behavioral: Negative.     Physical Exam:  weight is 212 lb (96.2 kg). His oral temperature is 98.9 F (37.2 C). His blood pressure is 108/57 (abnormal) and his pulse is 71. His respiration is 19 and oxygen  saturation is 99%.   Wt Readings from Last 3 Encounters:  09/13/20 212 lb (96.2 kg)  08/26/19 205 lb (93 kg)  12/17/18 213 lb (96.6 kg)    Physical Exam Vitals reviewed.  HENT:     Head: Normocephalic and atraumatic.  Eyes:     Pupils: Pupils are equal, round, and reactive to light.  Cardiovascular:     Rate and Rhythm: Normal rate and regular rhythm.     Heart sounds: Normal heart sounds.  Pulmonary:     Effort: Pulmonary effort is normal.     Breath sounds: Normal breath sounds.  Abdominal:     General: Bowel sounds are normal.     Palpations: Abdomen is soft.  Musculoskeletal:        General: No tenderness or deformity. Normal range of motion.     Cervical back: Normal range of motion.  Lymphadenopathy:     Cervical: No cervical adenopathy.  Skin:    General: Skin is warm and dry.     Findings: No erythema or rash.  Neurological:     Mental Status: He is alert and oriented to person, place, and time.  Psychiatric:        Behavior: Behavior normal.        Thought Content: Thought content normal.        Judgment: Judgment normal.     Lab Results  Component Value Date   WBC 6.9 09/13/2020   HGB 14.3 09/13/2020   HCT 41.4 09/13/2020   MCV 92.0 09/13/2020   PLT 229 09/13/2020     Chemistry      Component Value Date/Time   NA 142 09/13/2020 1458   NA 146 (H) 05/12/2017 0934   K 4.6 09/13/2020 1458   CL 108 09/13/2020 1458   CO2 27 09/13/2020 1458   BUN 23 (H) 09/13/2020 1458   BUN 16 05/12/2017 0934   CREATININE 1.58 (H) 09/13/2020 1458      Component Value Date/Time   CALCIUM 9.2 09/13/2020 1458   ALKPHOS 88 09/13/2020 1458   AST 28 09/13/2020 1458   ALT 41 09/13/2020 1458   BILITOT 1.5 (H) 09/13/2020 1458      Impression and Plan: [name] is a 55 year old white male.  He had a CVA in January 2019.  The CVA was in the right posterior insula and right lateral parietal lobe.  He has elevated IgG and IgA anticardiolipin antibodies.  He has an  elevated homocystine level.  He will clearly need lifelong anticoagulation.  He is on aspirin and folic acid right now.   At this point, we will let her go from the clinic.  He is doing well.  He has had no problems from my point of view.  I do still think that we are adding anything to his health care right now.  I told  him if he has any problems he can always call us back.   [name], MD 8/4/20223:48 PM

## 2020-09-14 ENCOUNTER — Telehealth: Payer: Self-pay | Admitting: Hematology & Oncology

## 2020-09-14 NOTE — Telephone Encounter (Signed)
NO los 8/4 

## 2021-03-07 ENCOUNTER — Ambulatory Visit (INDEPENDENT_AMBULATORY_CARE_PROVIDER_SITE_OTHER): Payer: BC Managed Care – PPO | Admitting: Neurology

## 2021-03-07 ENCOUNTER — Encounter: Payer: Self-pay | Admitting: Neurology

## 2021-03-07 VITALS — BP 109/67 | HR 68 | Ht 72.0 in | Wt 224.6 lb

## 2021-03-07 DIAGNOSIS — Z8673 Personal history of transient ischemic attack (TIA), and cerebral infarction without residual deficits: Secondary | ICD-10-CM

## 2021-03-07 DIAGNOSIS — E669 Obesity, unspecified: Secondary | ICD-10-CM | POA: Diagnosis not present

## 2021-03-07 DIAGNOSIS — G4733 Obstructive sleep apnea (adult) (pediatric): Secondary | ICD-10-CM | POA: Diagnosis not present

## 2021-03-07 DIAGNOSIS — R634 Abnormal weight loss: Secondary | ICD-10-CM | POA: Diagnosis not present

## 2021-03-07 NOTE — Patient Instructions (Signed)
Thank you for choosing [facility] for your sleep related care! It was nice to see you again today.  You have done a great job with your weight loss.   Here is what we discussed today:    Based on your symptoms and your exam I believe you may still be at risk for obstructive sleep apnea (aka OSA), and I think we should proceed with a sleep study to determine whether you do or do not have OSA and how severe it is. Even, if you have mild OSA, I may want you to consider treatment with CPAP, as treatment of even borderline or mild sleep apnea can result and improvement of symptoms such as sleep disruption, daytime sleepiness, nighttime bathroom breaks, restless leg symptoms, improvement of headache syndromes, even improved mood disorder.   As explained, an attended sleep study (meaning you get to stay overnight in the sleep lab), lets [place] monitor sleep-related behaviors such as sleep talking and leg movements in sleep, in addition to monitoring for sleep apnea.  A home sleep test is a screening tool for sleep apnea diagnosis only, but unfortunately, does not help with any other sleep-related diagnoses.  Please remember, the long-term risks and ramifications of untreated moderate to severe obstructive sleep apnea may include (but are not limited to): increased risk for cardiovascular disease, including congestive heart failure, stroke, difficult to control hypertension, treatment resistant obesity, arrhythmias, especially irregular heartbeat commonly known as A. Fib. (atrial fibrillation); even type 2 diabetes has been linked to untreated OSA.  Other correlations that untreated obstructive sleep apnea include macular edema which is swelling of the retina in the eyes, droopy eyelid syndrome, and elevated hemoglobin and hematocrit levels (often referred to as polycythemia).  Sleep apnea can cause disruption of sleep and sleep deprivation in most cases, which, in turn, can cause recurrent  headaches, problems with memory, mood, concentration, focus, and vigilance. Most people with untreated sleep apnea report excessive daytime sleepiness, which can affect their ability to drive. Please do not drive if you feel sleepy. Patients with sleep apnea can also develop difficulty initiating and maintaining sleep (aka insomnia).   Having sleep apnea may increase your risk for other sleep disorders, including involuntary behaviors sleep such as sleep terrors, sleep talking, sleepwalking.    Having sleep apnea can also increase your risk for restless leg syndrome and leg movements at night.   Please note that untreated obstructive sleep apnea may carry additional perioperative morbidity. Patients with significant obstructive sleep apnea (typically, in the moderate to severe degree) should receive, if possible, perioperative PAP (positive airway pressure) therapy and the surgeons and particularly the anesthesiologists should be informed of the diagnosis and the severity of the sleep disordered breathing.   I will likely see you back after your sleep study to go over the test results and where to go from there. We will call you after your sleep study to advise about the results (most likely, you will hear from [facility], my nurse) and to set up an appointment at the time, as necessary.    Our sleep lab administrative assistant will call you to schedule your sleep study and give you further instructions, regarding the check in process for the sleep study, arrival time, what to bring, when you can expect to leave after the study, etc., and to answer any other logistical questions you may have. If you don't hear back from her by about 2 weeks from now, please feel free to call her direct line  at [phone] or you can call our general clinic number, or email [place] through My Chart.

## 2021-03-07 NOTE — Progress Notes (Signed)
Subjective:    Patient ID: [name] is a 56 y.o. male.  HPI    Star Age, MD, PhD [facility] [address] [place], [place] [address]  Dear [name],     I saw your patient, [name], upon your kind request in my Sleep clinic today for reevaluation of his obstructive sleep apnea.  The patient is unaccompanied today.  As you know, [name] is a 56 year old right-handed gentleman with an underlying medical history of stroke in 2019, anticardiolipin antibody syndrome, hyperlipidemia, hypertension, and borderline obesity, who was diagnosed with obstructive sleep apnea in 2019.  I had originally evaluated him at the request of Dr. Leta Baptist.  He had a baseline sleep study on 06/09/2017 which indicated severe obstructive sleep apnea.  Sleep efficiency was 91.9%, sleep latency was 24 minutes, REM latency mildly delayed at 155 minutes.  He had an increased percentage of slow-wave sleep and reduced percentage of REM sleep.  He had a total AHI of 29.9/h, REM AHI was 77.3/h, supine AHI was 52.8/h.  Average oxygen saturation was 97%, nadir was 81%.  He was advised to return for a formal titration study.  He did not return for a second sleep study and did not return for follow-up.  Has been lost to follow-up since 2019. He reports, that he lost insurance and could not pursue treatment at the time.  I reviewed your office note from 12/24/2020.  He has been working on weight loss and has lost a considerable amount of weight of over 40 lb compared to 04/2017.  His Epworth sleepiness score is 1 out of 24, fatigue severity score is 8 out of 63.  He reports that altogether since his stroke he lost about 70 pounds.  He is a non-smoker and drinks alcohol rarely, maybe twice a year, caffeine in the form of soda or tea, limits himself to 1 serving per day on average.  His bedtime is between 11 and 11:30 PM and rise time around 7 AM.  He works in Sales executive.  He  lives with his wife, they have 1 dog in the household.  Previously:   05/05/17: 56 year old right-handed gentleman with an underlying medical history of stroke in January 2019, hyperlipidemia, allergies, abnormal coagulation profile, and obesity, who reports snoring and excessive daytime somnolence. Wife has noted pauses in his breathing while he is asleep. I reviewed your office note from 04/27/2017. His Epworth sleepiness score is 8 out of 24, fatigue score is 12 out of 63. He is married and lives with his wife. They have no children. He is a nonsmoker and drinks alcohol rarely, maybe once or twice per year, caffeine in the form of soda once a day. Bedtime is between 11 and MN. Not back to work. Works in Press photographer. WT is 6:45 currently. Has 2 dogs in the bedroom. No nocturia. No RLS symptoms, no PLMs reported. No FHx of OSA. He has some residual weakness in the LUE. Otherwise doing better. No AM HAs. He is still in therapy. He denies morning headaches typically.  His Past Medical History Is Significant For: Past Medical History:  Diagnosis Date   Acute ischemic right MCA stroke ([place]) 02/17/2017   Anti-cardiolipin antibody syndrome ([place]) 12/07/2017   Environmental allergies    Fall from roof    with kidney trauma   High cholesterol    Hypertension 2018   Stroke ([place]) 02/12/2017    His Past Surgical History Is Significant For: Past  Surgical History:  Procedure Laterality Date   LOOP RECORDER INSERTION N/A 02/16/2017   Procedure: LOOP RECORDER INSERTION;  Surgeon: Regan Lemmingamnitz, Will Martin, MD;  Location: [facility] INVASIVE CV LAB;  Service: Cardiovascular;  Laterality: N/A;   NO PAST SURGERIES     TEE WITHOUT CARDIOVERSION N/A 02/16/2017   Procedure: TRANSESOPHAGEAL ECHOCARDIOGRAM (TEE);  Surgeon: Jake BatheSkains, Mark C, MD;  [facility];  Service: Cardiovascular;  Laterality: N/A;    His Family History Is Significant For: Family History  Problem Relation Age of Onset   Hypertension Mother     Hypertension Father    Stroke Father    Hypertension Sister    Leukemia Paternal Uncle    Sleep apnea Neg Hx     His Social History Is Significant For: Social History   Socioeconomic History   Marital status: Married    Spouse name: [name]   Number of children: 0   Years of education: College   Highest education level: Not on file  Occupational History   Occupation: unemployed-outside Airline pilotsales    Comment: [employer]  Tobacco Use   Smoking status: Never   Smokeless tobacco: Never  Vaping Use   Vaping Use: Never used  Substance and Sexual Activity   Alcohol use: Yes    Alcohol/week: 0.0 standard drinks    Comment: rarely, 1-2 times/year   Drug use: No   Sexual activity: Yes    Partners: Female  Other Topics Concern   Not on file  Social History Narrative   Lives with his wife, [name] at home and 2 dogs.  [employer].  No children.  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    After 22 years, in 05/2017, he was let go by his employer upon his return to work after CVA.   Social Determinants of Health   Financial Resource Strain: Not on file  Food Insecurity: Not on file  Transportation Needs: Not on file  Physical Activity: Not on file  Stress: Not on file  Social Connections: Not on file    His Allergies Are:  Allergies  Allergen Reactions   Other Other (See Comments)    Strawberries and blueberries  Strawberries and blueberries   Penicillins Other (See Comments)    Headache Has patient had a PCN reaction causing immediate rash, facial/tongue/throat swelling, SOB or lightheadedness with hypotension: No Has patient had a PCN reaction causing severe rash involving mucus membranes or skin necrosis: No Has patient had a PCN reaction that required hospitalization: No Has patient had a PCN reaction occurring within the last 10 years:No If all of the above answers are "NO", then may proceed with Cephalosporin use.  Headache Has patient had a PCN reaction causing  immediate rash, facial/tongue/throat swelling, SOB or lightheadedness with hypotension: No Has patient had a PCN reaction causing severe rash involving mucus membranes or skin necrosis: No Has patient had a PCN reaction that required hospitalization: No Has patient had a PCN reaction occurring within the last 10 years:No If all of the above answers are "NO", then may proceed with Cephalosporin use.  :   His Current Medications Are:  Outpatient Encounter Medications as of 03/07/2021  Medication Sig   acetaminophen (TYLENOL) 325 MG tablet Take 650 mg by mouth every 6 (six) hours as needed for mild pain.   amLODipine (NORVASC) 10 MG tablet Take 1 tablet (10 mg total) by mouth daily.   aspirin EC 325 MG EC tablet Take 1 tablet (325 mg total) by mouth daily.  atorvastatin (LIPITOR) 40 MG tablet Take 1 tablet (40 mg total) by mouth daily at 6 PM.   cetirizine (ZYRTEC) 10 MG tablet Take 10 mg by mouth daily as needed for allergies.   escitalopram (LEXAPRO) 10 MG tablet Take 10 mg by mouth daily.   famotidine (PEPCID) 20 MG tablet Take 1 tablet (20 mg total) by mouth daily.   folic acid (FOLVITE) 1 MG tablet Take 1 tablet (1 mg total) by mouth daily.   lisinopril (PRINIVIL,ZESTRIL) 20 MG tablet Take 1 tablet (20 mg total) by mouth daily.   No facility-administered encounter medications on file as of 03/07/2021.  :   Review of Systems:  Out of a complete 14 point review of systems, all are reviewed and negative with the exception of these symptoms as listed below:  Review of Systems  Neurological:        Pt is here for sleep consult . Pt had sleep study done in 2019 . Pt  states he does have hypertension . Pt denies snoring , headaches and fatigue and CPAP machine.   ESS:1 FSS :8    Objective:  Neurological Exam  Physical Exam Physical Examination:   Vitals:   03/07/21 1338  BP: 109/67  Pulse: 68    General Examination: The patient is a very pleasant 56 y.o. male in no acute  distress. He appears well-developed and well-nourished and well groomed.   HEENT: Normocephalic, atraumatic, pupils are equal, round and reactive to light, corrective eyeglasses in place. Extraocular tracking is good without limitation to gaze excursion or nystagmus noted. Normal smooth pursuit is noted. Hearing is grossly intact. Face is symmetric with normal facial animation. Speech is clear with slight dysarthria noted. There is no hypophonia. There is no lip, neck/head, jaw or voice tremor. Neck is supple with full range of passive and active motion. There are no carotid bruits on auscultation. Oropharynx exam reveals: mild mouth dryness, adequate dental hygiene and moderate airway crowding, due to smaller airway, tonsils of 1+, larger uvula. Mallampati is class Li. Tongue protrudes centrally and palate elevates symmetrically. Neck size is 16 inches. He has a nearly absent overbite.    Chest: Clear to auscultation without wheezing, rhonchi or crackles noted.   Heart: S1+S2+0, regular and normal without murmurs, rubs or gallops noted.    Abdomen: Soft, non-tender and non-distended.   Extremities: There is no pitting edema in the distal lower extremities bilaterally.   Skin: Warm and dry without trophic changes noted.   Musculoskeletal: exam reveals no obvious joint deformities.    Neurologically:  Mental status: The patient is awake, alert and oriented in all 4 spheres. His immediate and remote memory, attention, language skills and fund of knowledge are appropriate. There is no evidence of aphasia, agnosia, apraxia or anomia. Speech is clear with normal prosody and enunciation. Thought process is linear. Mood is normal and affect is normal.  Cranial nerves II - XII are as described above under HEENT exam.  Motor exam: Normal bulk, strength and tone is noted, with the exception of the left upper extremity and left lower extremity.  Fine motor skills and coordination: Slight fine motor  dyscontrol in the left upper extremity.  Cerebellar testing: No dysmetria or intention tremor. There is no truncal or gait ataxia.  Sensory exam: intact to light touch in the upper and lower extremities.  Gait, station and balance: He stands without difficulty, walks without a limp.   Assessment and Plan:  In summary, Hanzel Magrini Li is  a very pleasant 56 y.o.-year old male with an underlying medical history of stroke in 2019, anticardiolipin antibody syndrome, hyperlipidemia, hypertension, and borderline obesity, who presents for re-evaluation of his obstructive sleep apnea.  In 2019 he was diagnosed with severe obstructive sleep apnea.  He did not pursue positive airway pressure treatment at the time due to loss of insurance coverage.  He has worked on weight loss aggressively.  Since his sleep study in April 2019 he has lost over 40 pounds.  It would make sense to recheck his sleep apnea with a sleep study.  He is commended on his weight loss success and healthy lifestyle endeavors. I talked to the patient about my findings and the diagnosis of OSA, its prognosis and treatment options. We talked about medical treatments, surgical interventions and non-pharmacological approaches. I explained in particular the risks and ramifications of untreated moderate to severe OSA, especially with respect to developing cardiovascular disease down the Road, including congestive heart failure, difficult to treat hypertension, cardiac arrhythmias, or stroke. Even type 2 diabetes has, in part, been linked to untreated OSA. Symptoms of untreated OSA include daytime sleepiness, memory problems, mood irritability and mood disorder such as depression and anxiety, lack of energy, as well as recurrent headaches, especially morning headaches. We talked about trying to maintain a healthy lifestyle in general, as well as the importance of weight control. We also talked about the importance of good sleep hygiene.  He is commended on  his weight loss success.  He is advised to proceed with a sleep study for reevaluation of his sleep apnea.  I outlined the differences between a laboratory attended sleep study versus home sleep test. I also went over possible surgical and non-surgical treatment options of OSA, including the use of a custom-made dental device (which would require a referral to a specialist dentist or oral surgeon), upper airway surgical options, such as traditional UPPP or a novel less invasive surgical option in the form of Inspire hypoglossal nerve stimulation (which would involve a referral to an ENT surgeon).  We will keep him posted as to his test results by phone call, and plan a follow-up appointment in sleep clinic accordingly.  He would be willing to try CPAP at this time if the need arises. I answered all his questions today and he was in agreement with our plan.Thank you very much for allowing me to participate in the care of this nice patient. If I can be of any further assistance to you please do not hesitate to call me at [phone].  Sincerely,   Star Age, MD, PhD

## 2021-03-26 ENCOUNTER — Telehealth: Payer: Self-pay

## 2021-03-26 NOTE — Telephone Encounter (Signed)
LVM for pt to call me back to schedule sleep study  

## 2021-04-08 ENCOUNTER — Ambulatory Visit (INDEPENDENT_AMBULATORY_CARE_PROVIDER_SITE_OTHER): Payer: BC Managed Care – PPO | Admitting: Neurology

## 2021-04-08 DIAGNOSIS — G4733 Obstructive sleep apnea (adult) (pediatric): Secondary | ICD-10-CM | POA: Diagnosis not present

## 2021-04-08 DIAGNOSIS — E669 Obesity, unspecified: Secondary | ICD-10-CM

## 2021-04-08 DIAGNOSIS — R634 Abnormal weight loss: Secondary | ICD-10-CM

## 2021-04-08 DIAGNOSIS — Z8673 Personal history of transient ischemic attack (TIA), and cerebral infarction without residual deficits: Secondary | ICD-10-CM

## 2021-04-08 DIAGNOSIS — E66811 Obesity, class 1: Secondary | ICD-10-CM

## 2021-04-10 NOTE — Progress Notes (Signed)
° °  [facility] ° °HOME SLEEP TEST (Watch PAT) REPORT ° °STUDY DATE: 04/08/21 ° °DOB: 10/15/1965 ° °MRN: [id number] ° °ORDERING CLINICIAN: Aziel Morgan, MD, PhD °  °REFERRING CLINICIAN: Jeffery, Chelle, PA  ° °CLINICAL INFORMATION/HISTORY: 56-year-old right-handed gentleman with an underlying medical history of stroke in 2019, anticardiolipin antibody syndrome, hyperlipidemia, hypertension, and borderline obesity, who was diagnosed with obstructive sleep apnea in 2019.  He presents for re-evaluation; he has lost over 40 lb since 2019. ° °Epworth sleepiness score: 1/24. ° °BMI: 30.5 kg/m² ° °FINDINGS:  ° °Sleep Summary:  ° °Total Recording Time (hours, min): 7 hours, 45 minutes ° °Total Sleep Time (hours, min):  6 hours, 58 minutes  ° °Percent REM (%):    22.7%  ° °Respiratory Indices:  ° °Calculated pAHI (per hour):  32.5/hour        ° °REM pAHI:    37.4/hour      ° °NREM pAHI: 31.1/hour ° °Oxygen Saturation Statistics:  °  °Oxygen Saturation (%) Mean: 90%  ° °Minimum oxygen saturation (%):                 82%  ° °O2 Saturation Range (%): 82-97%   ° °O2 Saturation (minutes) <=88%: 25.1 min ° °Pulse Rate Statistics:  ° °Pulse Mean (bpm):    64/min   ° °Pulse Range (36-97/min)  ° °IMPRESSION: OSA (obstructive sleep apnea), severe ° °RECOMMENDATION:  °This home sleep test demonstrates severe obstructive sleep apnea with a total AHI of 32.5/hour and O2 nadir of 82%.  Snoring was detected, ranging from mild to loud.  Treatment with positive airway pressure is highly recommended. This will require - ideally - a full night CPAP titration study for proper treatment settings, O2 monitoring and mask fitting. For now, the patient will be advised to proceed with an autoPAP titration/trial at home.  A laboratory attended titration study can be considered in the future for optimization of his treatment and better tolerance of therapy.  Alternative treatment options are limited secondary to the severity of the  patient's sleep disordered breathing.  Evaluation for inspire, implanted hypoglossal nerve stimulator can be considered in the future.  Concomitant weight loss is recommended.  Please note, that untreated obstructive sleep apnea may carry additional perioperative morbidity. Patients with significant obstructive sleep apnea should receive perioperative PAP therapy and the surgeons and particularly the anesthesiologist should be informed of the diagnosis and the severity of the sleep disordered breathing. °The patient should be cautioned not to drive, work at heights, or operate dangerous or heavy equipment when tired or sleepy. Review and reiteration of good sleep hygiene measures should be pursued with any patient. °Other causes of the patient's symptoms, including circadian rhythm disturbances, an underlying mood disorder, medication effect and/or an underlying medical problem cannot be ruled out based on this test. Clinical correlation is recommended. The patient and his referring provider will be notified of the test results. The patient will be seen in follow up in sleep clinic at [facility]. ° °I certify that I have reviewed the raw data recording prior to the issuance of this report in accordance with the standards of the American Academy of Sleep Medicine (AASM). ° °INTERPRETING PHYSICIAN:  ° °Tatym Schermer, MD, PhD  °Board Certified in Neurology and Sleep Medicine ° °[facility] °[address], [address] °[place], [place] [address] °[phone] ° ° ° ° ° ° ° ° ° ° ° ° ° ° ° ° °

## 2021-04-10 NOTE — Procedures (Signed)
° °  [facility]  HOME SLEEP TEST (Watch PAT) REPORT  STUDY DATE: 04/08/21  DOB: May 18, 1965  MRN: [id number]  ORDERING CLINICIAN: Star Age, MD, PhD   REFERRING CLINICIAN: Harrison Mons, PA   CLINICAL INFORMATION/HISTORY: 55 year old right-handed gentleman with an underlying medical history of stroke in 2019, anticardiolipin antibody syndrome, hyperlipidemia, hypertension, and borderline obesity, who was diagnosed with obstructive sleep apnea in 2019.  He presents for re-evaluation; he has lost over 40 lb since 2019.  Epworth sleepiness score: 1/24.  BMI: 30.5 kg/m  FINDINGS:   Sleep Summary:   Total Recording Time (hours, min): 7 hours, 45 minutes  Total Sleep Time (hours, min):  6 hours, 58 minutes   Percent REM (%):    22.7%   Respiratory Indices:   Calculated pAHI (per hour):  32.5/hour         REM pAHI:    37.4/hour       NREM pAHI: 31.1/hour  Oxygen Saturation Statistics:    Oxygen Saturation (%) Mean: 90%   Minimum oxygen saturation (%):                 82%   O2 Saturation Range (%): 82-97%    O2 Saturation (minutes) <=88%: 25.1 min  Pulse Rate Statistics:   Pulse Mean (bpm):    64/min    Pulse Range (36-97/min)   IMPRESSION: OSA (obstructive sleep apnea), severe  RECOMMENDATION:  This home sleep test demonstrates severe obstructive sleep apnea with a total AHI of 32.5/hour and O2 nadir of 82%.  Snoring was detected, ranging from mild to loud.  Treatment with positive airway pressure is highly recommended. This will require - ideally - a full night CPAP titration study for proper treatment settings, O2 monitoring and mask fitting. For now, the patient will be advised to proceed with an autoPAP titration/trial at home.  A laboratory attended titration study can be considered in the future for optimization of his treatment and better tolerance of therapy.  Alternative treatment options are limited secondary to the severity of the  patient's sleep disordered breathing.  Evaluation for inspire, implanted hypoglossal nerve stimulator can be considered in the future.  Concomitant weight loss is recommended.  Please note, that untreated obstructive sleep apnea may carry additional perioperative morbidity. Patients with significant obstructive sleep apnea should receive perioperative PAP therapy and the surgeons and particularly the anesthesiologist should be informed of the diagnosis and the severity of the sleep disordered breathing. The patient should be cautioned not to drive, work at heights, or operate dangerous or heavy equipment when tired or sleepy. Review and reiteration of good sleep hygiene measures should be pursued with any patient. Other causes of the patient's symptoms, including circadian rhythm disturbances, an underlying mood disorder, medication effect and/or an underlying medical problem cannot be ruled out based on this test. Clinical correlation is recommended. The patient and his referring provider will be notified of the test results. The patient will be seen in follow up in sleep clinic at [facility].  I certify that I have reviewed the raw data recording prior to the issuance of this report in accordance with the standards of the American Academy of Sleep Medicine (AASM).  INTERPRETING PHYSICIAN:   Star Age, MD, PhD  Board Certified in Neurology and Sleep Medicine  [facility] [address], [place], [place] [address] [phone]

## 2021-04-10 NOTE — Addendum Note (Signed)
Addended by: [name] on: 04/10/2021 04:46 PM   Modules accepted: Orders

## 2021-04-11 ENCOUNTER — Telehealth: Payer: Self-pay | Admitting: *Deleted

## 2021-04-11 NOTE — Telephone Encounter (Signed)
Called pt and LVM (ok per DPR) asking for call back or mychart message to discuss next steps regarding autopap setup.  I provided the sleep study results on patient's voicemail advising his home sleep test showed obstructive sleep apnea in the severe range.  Advised patient of insurance compliance requirements and asked for him to get back in touch with [place] with his preference of which DME company he wants to use if he is okay to move forward with AutoPap. Sent pt a [employer] geologist.  ?

## 2021-04-11 NOTE — Telephone Encounter (Signed)
-----   Message from [name], MD sent at 04/10/2021  4:45 PM EST ----- ?Patient presented for re-eval of his severe OSA, seen by me on 03/07/2021, patient had a HST on 04/08/2021.   ? ?Please call and notify the patient that the recent home sleep test showed obstructive sleep apnea in the severe range. I recommend treatment for this in the form of autoPAP, which means, that we don't have to bring him in for a sleep study with CPAP, but will let him start using a so called autoPAP machine at home, through a DME company (of his choice, or as per insurance requirement). The DME representative will fit the patient with a mask of choice, educate him on how to use the machine, how to put the mask on, etc. I have placed an order in the chart. Please send the order to a local DME, talk to patient, send report to referring MD. Please also reinforce the need for compliance with treatment. We will need a FU in sleep clinic for 10 weeks post-PAP set up, please arrange that with me or one of our NPs. Thanks,  ? ?[name], MD, PhD ?[facility]) ? ? ? ? ?

## 2021-04-16 NOTE — Telephone Encounter (Signed)
Called pt and LVM (ok per DPR) asking for call back or mychart message to discuss next steps regarding autopap setup.  I provided the sleep study results on patient's voicemail advising his home sleep test showed obstructive sleep apnea in the severe range.  Advised patient of insurance compliance requirements and asked for him to get back in touch with [place] with his preference of which DME company he wants to use if he is okay to move forward with AutoPap. ? ?Also note patient has viewed the mychart message sent on 04-11-21. Will attempt to call the pt once more on a different day then send letter.  ?

## 2021-04-17 NOTE — Telephone Encounter (Signed)
The patient returned my call and we discussed the sleep study results.  Patient is amenable to proceeding with AutoPap.  We discussed the insurance compliance requirements which includes using the machine at least 4 hours a night and also being seen in the office for an initial follow-up.  We scheduled his appointment for Monday May 15th at 10:45 AM, arrival 15 to 30 minutes early with his machine and power cord.  We discussed options for DME companies.  We will go with [facility] which is close to his office.  Patient aware they will get in touch with him within the next few days to get the process started.  He verbalized appreciation for the call and his questions were answered. ? ?Order, sleep study result, office note, insurance information all faxed to [facility]. Received a receipt of confirmation. ? ?

## 2021-04-29 NOTE — Telephone Encounter (Signed)
Resmed Airsense 11  ?setup 04/24/21 (appt needed 05/26/21-07/25/21) ?

## 2021-06-13 ENCOUNTER — Other Ambulatory Visit: Payer: Self-pay | Admitting: Hematology & Oncology

## 2021-06-20 ENCOUNTER — Telehealth: Payer: Self-pay | Admitting: Neurology

## 2021-06-20 NOTE — Telephone Encounter (Signed)
Reminder sent to bring autopap to appt 5/15. ?

## 2021-06-24 ENCOUNTER — Ambulatory Visit (INDEPENDENT_AMBULATORY_CARE_PROVIDER_SITE_OTHER): Payer: BC Managed Care – PPO | Admitting: Neurology

## 2021-06-24 ENCOUNTER — Encounter: Payer: Self-pay | Admitting: Neurology

## 2021-06-24 VITALS — BP 137/80 | HR 66 | Ht 71.0 in | Wt 225.4 lb

## 2021-06-24 DIAGNOSIS — G4733 Obstructive sleep apnea (adult) (pediatric): Secondary | ICD-10-CM

## 2021-06-24 DIAGNOSIS — Z9989 Dependence on other enabling machines and devices: Secondary | ICD-10-CM | POA: Diagnosis not present

## 2021-06-24 NOTE — Progress Notes (Signed)
Subjective:  ?  ?Patient ID: [name] is a 56 y.o. male. ? ?HPI ? ? ? ?Interim history:  ? ?[name] is a 56 year old right-handed gentleman with an underlying medical history of stroke in 2019, anticardiolipin antibody syndrome, hyperlipidemia, hypertension, and borderline obesity, who presents for follow-up consultation of his obstructive sleep apnea after interim testing and starting AutoPap therapy.  The patient is unaccompanied today.  I saw him on 01/05/2022 at the request of his primary care PA, at which time he reported a prior diagnosis of sleep apnea and in fact, he had sleep testing in 2019.  He had lost weight since then in the realm of 40 pounds.  He was advised to proceed with a repeat sleep study.  He had a home sleep test on 04/08/2021 which indicated severe obstructive sleep apnea with an AHI of 32.5/h, O2 nadir 82% with snoring detected, ranging from mild to loud.  He was advised to proceed with AutoPap therapy.  His set up date was 04/24/2021.  He has a ResMed air sense 11 AutoSet machine. ? ?Today, 55/15/2023: I reviewed his AutoPap compliance data from 05/21/2021 through 06/19/2021, which is a total of 30 days, during which time he used his machine 28 days with percent use days greater than 4 hours at 90%, indicating excellent compliance with an average usage of 5 hours and 11 minutes, residual AHI at goal at 3.7/h, 95th percentile pressure 12.3 cm with a range of 6 to 13 cm with EPR, leak acceptable with the 95th percentile at 7.2 L/min.  He reports doing well, after initial adjustment, he has been compliant with treatment and feels improved with regards to his daytime energy and sleepiness.  He is very motivated to continue with treatment.  He is trying to sleep a little more, he feels that he can sleep a little better through the night.  He has regular checkup with his primary care, no new issues.  He uses a nasal mask with good success. ?  ?Previously:  ? ?03/07/21: (He) was diagnosed with  obstructive sleep apnea in 2019.  I had originally evaluated him at the request of [name].  He had a baseline sleep study on 06/09/2017 which indicated severe obstructive sleep apnea.  Sleep efficiency was 91.9%, sleep latency was 24 minutes, REM latency mildly delayed at 155 minutes.  He had an increased percentage of slow-wave sleep and reduced percentage of REM sleep.  He had a total AHI of 29.9/h, REM AHI was 77.3/h, supine AHI was 52.8/h.  Average oxygen saturation was 97%, nadir was 81%.  He was advised to return for a formal titration study.  He did not return for a second sleep study and did not return for follow-up.  Has been lost to follow-up since 2019. He reports, that he lost insurance and could not pursue treatment at the time.  I reviewed your office note from 12/24/2020.  He has been working on weight loss and has lost a considerable amount of weight of over 40 lb compared to 04/2017.  His Epworth sleepiness score is 1 out of 24, fatigue severity score is 8 out of 63.  He reports that altogether since his stroke he lost about 70 pounds.  He is a non-smoker and drinks alcohol rarely, maybe twice a year, caffeine in the form of soda or tea, limits himself to 1 serving per day on average.  His bedtime is between 11 and 11:30 PM and rise time around 7 AM.  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  He lives with his wife, they have 1 dog in the household. ?  ?05/05/17: 56 year old right-handed gentleman with an underlying medical history of stroke in January 2019, hyperlipidemia, allergies, abnormal coagulation profile, and obesity, who reports snoring and excessive daytime somnolence. Wife has noted pauses in his breathing while he is asleep. I reviewed your office note from 04/27/2017. His Epworth sleepiness score is 8 out of 24, fatigue score is 12 out of 63. He is married and lives with his wife. They have no children. He is a nonsmoker and drinks alcohol rarely, maybe once or twice per year, caffeine in the  form of soda once a day. ?Bedtime is between 11 and MN. Not back to work. Works in [employer]. WT is 6:45 currently. Has 2 dogs in the bedroom. No nocturia. No RLS symptoms, no PLMs reported. No FHx of OSA. He has some residual weakness in the LUE. Otherwise doing better. No AM HAs. He is still in therapy. He denies morning headaches typically. ?The patient's allergies, current medications, family history, past medical history, past social history, past surgical history and problem list were reviewed and updated as appropriate.  ? ? ?His Past Medical History Is Significant For: ?Past Medical History:  ?Diagnosis Date  ? Acute ischemic right MCA stroke (HCC) 02/17/2017  ? Anti-cardiolipin antibody syndrome (HCC) 12/07/2017  ? Environmental allergies   ? Fall from roof   ? with kidney trauma  ? High cholesterol   ? Hypertension 2018  ? Stroke [name](HCC) 02/12/2017  ? ? ?His Past Surgical History Is Significant For: ?Past Surgical History:  ?Procedure Laterality Date  ? LOOP RECORDER INSERTION N/A 02/16/2017  ? Procedure: LOOP RECORDER INSERTION;  Surgeon: [name], Jesus Martin, MD;  Location: [name];  Service: Cardiovascular;  Laterality: N/A;  ? NO PAST SURGERIES    ? TEE WITHOUT CARDIOVERSION N/A 02/16/2017  ? Procedure: TRANSESOPHAGEAL ECHOCARDIOGRAM (TEE);  Surgeon: [name], Jesus C, MD;  Location: [name];  Service: Cardiovascular;  Laterality: N/A;  ? ? ?His Family History Is Significant For: ?Family History  ?Problem Relation Age of Onset  ? Hypertension Mother   ? Hypertension Father   ? Stroke Father   ? Hypertension Sister   ? Leukemia Paternal Uncle   ? Sleep apnea Neg Hx   ? ? ?His Social History Is Significant For: ?Social History  ? ?Socioeconomic History  ? Marital status: Married  ?  Spouse name: [name]  ? Number of children: 0  ? Years of education: College  ? Highest education level: Not on file  ?Occupational History  ? Occupation: unemployed-outside sales  ?  Comment: [employer]  ?Tobacco  Use  ? Smoking status: Never  ? Smokeless tobacco: Never  ?Vaping Use  ? Vaping Use: Never used  ?Substance and Sexual Activity  ? Alcohol use: Yes  ?  Alcohol/week: 0.0 standard drinks  ?  Comment: rarely, 1-2 times/year  ? Drug use: No  ? Sexual activity: Yes  ?  Partners: Female  ?Other Topics Concern  ? Not on file  ?Social History Narrative  ? Lives with his wife, [name] at home and 2 dogs.  [employer].  No children.  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   ? After 22 years, in 05/2017, he was let go by his employer upon his return to work after CVA.  ? ?Social Determinants of Health  ? ?Financial Resource Strain: Not on file  ?Food Insecurity: Not on file  ?  Transportation Needs: Not on file  ?Physical Activity: Not on file  ?Stress: Not on file  ?Social Connections: Not on file  ? ? ?His Allergies Are:  ?Allergies  ?Allergen Reactions  ? Other Other (See Comments)  ?  Strawberries and blueberries ? ?Strawberries and blueberries  ? Penicillins Other (See Comments)  ?  Headache ?Has patient had a PCN reaction causing immediate rash, facial/tongue/throat swelling, SOB or lightheadedness with hypotension: No ?Has patient had a PCN reaction causing severe rash involving mucus membranes or skin necrosis: No ?Has patient had a PCN reaction that required hospitalization: No ?Has patient had a PCN reaction occurring within the last 10 years:No ?If all of the above answers are "NO", then may proceed with Cephalosporin use. ? ?Headache ?Has patient had a PCN reaction causing immediate rash, facial/tongue/throat swelling, SOB or lightheadedness with hypotension: No ?Has patient had a PCN reaction causing severe rash involving mucus membranes or skin necrosis: No ?Has patient had a PCN reaction that required hospitalization: No ?Has patient had a PCN reaction occurring within the last 10 years:No ?If all of the above answers are "NO", then may proceed with Cephalosporin use.  ?:  ? ?His Current Medications Are:  ?Outpatient  Encounter Medications as of 06/24/2021  ?Medication Sig  ? acetaminophen (TYLENOL) 325 MG tablet Take 650 mg by mouth every 6 (six) hours as needed for mild pain.  ? amLODipine (NORVASC) 10 MG tablet Take

## 2021-06-24 NOTE — Patient Instructions (Signed)
It was nice to see you again today.  I am glad to hear that your AutoPap is working well for you.  You are compliant with treatment and your apnea scores look good, leak from the mask also acceptable, I would like for you to continue with treatment at the current settings.  Keep up the good work! ? ?Please continue using your autoPAP regularly. While your insurance requires that you use PAP at least 4 hours each night on 70% of the nights, I recommend, that you not skip any nights and use it throughout the night if you can. Getting used to PAP and staying with the treatment long term does take time and patience and discipline. Untreated obstructive sleep apnea when it is moderate to severe can have an adverse impact on cardiovascular health and raise her risk for heart disease, arrhythmias, hypertension, congestive heart failure, stroke and diabetes. Untreated obstructive sleep apnea causes sleep disruption, nonrestorative sleep, and sleep deprivation. This can have an impact on your day to day functioning and cause daytime sleepiness and impairment of cognitive function, memory loss, mood disturbance, and problems focussing. Using PAP regularly can improve these symptoms. ? ?We can see you in 1 year, you can see one of our nurse practitioners.  We can offer you a virtual visit through [facility] video visit if you prefer. ? ? ? ?

## 2022-02-01 ENCOUNTER — Ambulatory Visit
Admission: EM | Admit: 2022-02-01 | Discharge: 2022-02-01 | Disposition: A | Discharge status: Home / Self Care | Payer: BC Managed Care – PPO | Attending: Urgent Care | Admitting: Urgent Care

## 2022-02-01 ENCOUNTER — Ambulatory Visit (INDEPENDENT_AMBULATORY_CARE_PROVIDER_SITE_OTHER): Payer: BC Managed Care – PPO

## 2022-02-01 DIAGNOSIS — S20211A Contusion of right front wall of thorax, initial encounter: Secondary | ICD-10-CM

## 2022-02-01 DIAGNOSIS — S20219A Contusion of unspecified front wall of thorax, initial encounter: Secondary | ICD-10-CM

## 2022-02-01 MED ORDER — TIZANIDINE HCL 4 MG PO TABS: 4.0000 mg | ORAL_TABLET | Freq: Every day | ORAL | 0 refills | Status: DC

## 2022-02-01 MED ORDER — PREDNISONE 20 MG PO TABS: ORAL_TABLET | ORAL | 0 refills | Status: DC
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
## 2022-06-25 NOTE — Patient Instructions (Addendum)
Please continue using your CPAP regularly. While your insurance requires that you use CPAP at least 4 hours each night on 70% of the nights, I recommend, that you not skip any nights and use it throughout the night if you can. Getting used to CPAP and staying with the treatment long term does take time and patience and discipline. Untreated obstructive sleep apnea when it is moderate to severe can have an adverse impact on cardiovascular health and raise her risk for heart disease, arrhythmias, hypertension, congestive heart failure, stroke and diabetes. Untreated obstructive sleep apnea causes sleep disruption, nonrestorative sleep, and sleep deprivation. This can have an impact on your day to day functioning and cause daytime sleepiness and impairment of cognitive function, memory loss, mood disturbance, and problems focussing. Using CPAP regularly can improve these symptoms.  We will update supply orders, today. Continue close follow up with PCP for stroke prevention.   Follow up in 1 year

## 2022-06-30 ENCOUNTER — Ambulatory Visit: Payer: BC Managed Care – PPO | Admitting: Adult Health

## 2022-06-30 ENCOUNTER — Encounter: Payer: Self-pay | Admitting: Family Medicine

## 2022-06-30 ENCOUNTER — Ambulatory Visit (INDEPENDENT_AMBULATORY_CARE_PROVIDER_SITE_OTHER): Payer: BC Managed Care – PPO | Admitting: Family Medicine

## 2022-06-30 VITALS — BP 134/77 | HR 76 | Ht 71.0 in | Wt 230.2 lb

## 2022-06-30 DIAGNOSIS — G4733 Obstructive sleep apnea (adult) (pediatric): Secondary | ICD-10-CM
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
## 2023-06-17 NOTE — Patient Instructions (Signed)

## 2023-06-18 NOTE — Progress Notes (Signed)
 [name]

## 2023-06-22 ENCOUNTER — Ambulatory Visit: Payer: BC Managed Care – PPO | Admitting: Family Medicine

## 2023-06-22 ENCOUNTER — Encounter: Payer: Self-pay | Admitting: Family Medicine

## 2023-06-22 VITALS — BP 135/76 | HR 63 | Ht 72.0 in | Wt 232.0 lb

## 2023-06-22 DIAGNOSIS — G4733 Obstructive sleep apnea (adult) (pediatric): Secondary | ICD-10-CM | POA: Diagnosis not present

## 2024-06-27 ENCOUNTER — Ambulatory Visit: Admitting: Family Medicine
